# Patient Record
Sex: Female | Born: 1935 | Race: White | Hispanic: No | Smoking: Never smoker
Health system: Southern US, Community
[De-identification: ages and names within clinical notes are randomized; demographics above are authoritative.]

## PROBLEM LIST (undated history)

## (undated) DIAGNOSIS — I10 Essential (primary) hypertension: Secondary | ICD-10-CM

## (undated) DIAGNOSIS — K589 Irritable bowel syndrome without diarrhea: Secondary | ICD-10-CM

## (undated) DIAGNOSIS — M1990 Unspecified osteoarthritis, unspecified site: Secondary | ICD-10-CM

## (undated) HISTORY — PX: APPENDECTOMY: SHX54

## (undated) HISTORY — PX: ABDOMINAL HYSTERECTOMY: SHX81

## (undated) HISTORY — DX: Essential (primary) hypertension: I10

## (undated) HISTORY — PX: THYROIDECTOMY: SHX17

## (undated) HISTORY — DX: Irritable bowel syndrome, unspecified: K58.9

## (undated) HISTORY — PX: BREAST BIOPSY: SHX20

---

## 1999-07-29 ENCOUNTER — Other Ambulatory Visit: Admission: RE | Admit: 1999-07-29 | Discharge: 1999-07-29 | Payer: Self-pay | Admitting: Family Medicine

## 2006-10-17 HISTORY — PX: COLONOSCOPY: SHX174

## 2011-12-12 DIAGNOSIS — Z1231 Encounter for screening mammogram for malignant neoplasm of breast: Secondary | ICD-10-CM | POA: Diagnosis not present

## 2012-05-21 DIAGNOSIS — L570 Actinic keratosis: Secondary | ICD-10-CM | POA: Diagnosis not present

## 2012-05-21 DIAGNOSIS — D485 Neoplasm of uncertain behavior of skin: Secondary | ICD-10-CM | POA: Diagnosis not present

## 2012-05-21 DIAGNOSIS — Z85828 Personal history of other malignant neoplasm of skin: Secondary | ICD-10-CM | POA: Diagnosis not present

## 2012-05-21 DIAGNOSIS — D2330 Other benign neoplasm of skin of unspecified part of face: Secondary | ICD-10-CM | POA: Diagnosis not present

## 2012-06-20 DIAGNOSIS — Z124 Encounter for screening for malignant neoplasm of cervix: Secondary | ICD-10-CM | POA: Diagnosis not present

## 2012-06-20 DIAGNOSIS — E785 Hyperlipidemia, unspecified: Secondary | ICD-10-CM | POA: Diagnosis not present

## 2012-06-20 DIAGNOSIS — I10 Essential (primary) hypertension: Secondary | ICD-10-CM | POA: Diagnosis not present

## 2012-06-20 DIAGNOSIS — E559 Vitamin D deficiency, unspecified: Secondary | ICD-10-CM | POA: Diagnosis not present

## 2012-06-20 DIAGNOSIS — Z0000 Encounter for general adult medical examination without abnormal findings: Secondary | ICD-10-CM | POA: Diagnosis not present

## 2012-06-29 DIAGNOSIS — R7989 Other specified abnormal findings of blood chemistry: Secondary | ICD-10-CM | POA: Diagnosis not present

## 2012-06-29 DIAGNOSIS — R945 Abnormal results of liver function studies: Secondary | ICD-10-CM | POA: Diagnosis not present

## 2012-06-29 DIAGNOSIS — R109 Unspecified abdominal pain: Secondary | ICD-10-CM | POA: Diagnosis not present

## 2012-06-29 DIAGNOSIS — R634 Abnormal weight loss: Secondary | ICD-10-CM | POA: Diagnosis not present

## 2012-06-29 DIAGNOSIS — R630 Anorexia: Secondary | ICD-10-CM | POA: Diagnosis not present

## 2012-07-04 DIAGNOSIS — R634 Abnormal weight loss: Secondary | ICD-10-CM | POA: Diagnosis not present

## 2012-07-04 DIAGNOSIS — D510 Vitamin B12 deficiency anemia due to intrinsic factor deficiency: Secondary | ICD-10-CM | POA: Diagnosis not present

## 2012-07-04 DIAGNOSIS — R5383 Other fatigue: Secondary | ICD-10-CM | POA: Diagnosis not present

## 2012-07-04 DIAGNOSIS — R109 Unspecified abdominal pain: Secondary | ICD-10-CM | POA: Diagnosis not present

## 2012-07-10 DIAGNOSIS — Z23 Encounter for immunization: Secondary | ICD-10-CM | POA: Diagnosis not present

## 2012-07-16 DIAGNOSIS — H2589 Other age-related cataract: Secondary | ICD-10-CM | POA: Diagnosis not present

## 2012-07-16 DIAGNOSIS — H52229 Regular astigmatism, unspecified eye: Secondary | ICD-10-CM | POA: Diagnosis not present

## 2012-07-16 DIAGNOSIS — H524 Presbyopia: Secondary | ICD-10-CM | POA: Diagnosis not present

## 2012-07-16 DIAGNOSIS — H5200 Hypermetropia, unspecified eye: Secondary | ICD-10-CM | POA: Diagnosis not present

## 2012-07-17 DIAGNOSIS — M48061 Spinal stenosis, lumbar region without neurogenic claudication: Secondary | ICD-10-CM | POA: Diagnosis not present

## 2012-07-17 DIAGNOSIS — M5126 Other intervertebral disc displacement, lumbar region: Secondary | ICD-10-CM | POA: Diagnosis not present

## 2012-07-17 DIAGNOSIS — M4310 Spondylolisthesis, site unspecified: Secondary | ICD-10-CM | POA: Diagnosis not present

## 2012-07-17 DIAGNOSIS — M5137 Other intervertebral disc degeneration, lumbosacral region: Secondary | ICD-10-CM | POA: Diagnosis not present

## 2012-07-25 ENCOUNTER — Ambulatory Visit: Payer: Medicare Other | Attending: Family Medicine | Admitting: Physical Therapy

## 2012-07-25 DIAGNOSIS — M545 Low back pain, unspecified: Secondary | ICD-10-CM | POA: Insufficient documentation

## 2012-07-25 DIAGNOSIS — R5381 Other malaise: Secondary | ICD-10-CM | POA: Insufficient documentation

## 2012-07-25 DIAGNOSIS — IMO0001 Reserved for inherently not codable concepts without codable children: Secondary | ICD-10-CM | POA: Insufficient documentation

## 2012-07-27 ENCOUNTER — Ambulatory Visit: Payer: Medicare Other | Admitting: *Deleted

## 2012-07-27 DIAGNOSIS — IMO0001 Reserved for inherently not codable concepts without codable children: Secondary | ICD-10-CM | POA: Diagnosis not present

## 2012-07-27 DIAGNOSIS — M545 Low back pain: Secondary | ICD-10-CM | POA: Diagnosis not present

## 2012-07-27 DIAGNOSIS — R5381 Other malaise: Secondary | ICD-10-CM | POA: Diagnosis not present

## 2012-07-31 ENCOUNTER — Ambulatory Visit: Payer: Medicare Other | Admitting: Physical Therapy

## 2012-07-31 DIAGNOSIS — IMO0001 Reserved for inherently not codable concepts without codable children: Secondary | ICD-10-CM | POA: Diagnosis not present

## 2012-07-31 DIAGNOSIS — M545 Low back pain: Secondary | ICD-10-CM | POA: Diagnosis not present

## 2012-07-31 DIAGNOSIS — R5381 Other malaise: Secondary | ICD-10-CM | POA: Diagnosis not present

## 2012-08-02 ENCOUNTER — Ambulatory Visit: Payer: Medicare Other | Admitting: Physical Therapy

## 2012-08-02 DIAGNOSIS — R5381 Other malaise: Secondary | ICD-10-CM | POA: Diagnosis not present

## 2012-08-02 DIAGNOSIS — M545 Low back pain: Secondary | ICD-10-CM | POA: Diagnosis not present

## 2012-08-02 DIAGNOSIS — IMO0001 Reserved for inherently not codable concepts without codable children: Secondary | ICD-10-CM | POA: Diagnosis not present

## 2012-08-07 ENCOUNTER — Ambulatory Visit: Payer: Medicare Other | Admitting: Physical Therapy

## 2012-08-07 DIAGNOSIS — IMO0001 Reserved for inherently not codable concepts without codable children: Secondary | ICD-10-CM | POA: Diagnosis not present

## 2012-08-07 DIAGNOSIS — M545 Low back pain: Secondary | ICD-10-CM | POA: Diagnosis not present

## 2012-08-07 DIAGNOSIS — R5381 Other malaise: Secondary | ICD-10-CM | POA: Diagnosis not present

## 2012-08-09 ENCOUNTER — Ambulatory Visit: Payer: Medicare Other | Admitting: Physical Therapy

## 2012-08-09 DIAGNOSIS — R5381 Other malaise: Secondary | ICD-10-CM | POA: Diagnosis not present

## 2012-08-09 DIAGNOSIS — M545 Low back pain: Secondary | ICD-10-CM | POA: Diagnosis not present

## 2012-08-09 DIAGNOSIS — IMO0001 Reserved for inherently not codable concepts without codable children: Secondary | ICD-10-CM | POA: Diagnosis not present

## 2012-08-14 ENCOUNTER — Ambulatory Visit: Payer: Medicare Other | Admitting: Physical Therapy

## 2012-08-14 DIAGNOSIS — M545 Low back pain: Secondary | ICD-10-CM | POA: Diagnosis not present

## 2012-08-14 DIAGNOSIS — R5381 Other malaise: Secondary | ICD-10-CM | POA: Diagnosis not present

## 2012-08-14 DIAGNOSIS — IMO0001 Reserved for inherently not codable concepts without codable children: Secondary | ICD-10-CM | POA: Diagnosis not present

## 2012-08-16 ENCOUNTER — Ambulatory Visit: Payer: Medicare Other | Admitting: Physical Therapy

## 2012-08-16 DIAGNOSIS — R5381 Other malaise: Secondary | ICD-10-CM | POA: Diagnosis not present

## 2012-08-16 DIAGNOSIS — M545 Low back pain: Secondary | ICD-10-CM | POA: Diagnosis not present

## 2012-08-16 DIAGNOSIS — IMO0001 Reserved for inherently not codable concepts without codable children: Secondary | ICD-10-CM | POA: Diagnosis not present

## 2012-08-21 ENCOUNTER — Ambulatory Visit: Payer: Medicare Other | Attending: Family Medicine | Admitting: Physical Therapy

## 2012-08-21 DIAGNOSIS — IMO0001 Reserved for inherently not codable concepts without codable children: Secondary | ICD-10-CM | POA: Insufficient documentation

## 2012-08-21 DIAGNOSIS — M545 Low back pain, unspecified: Secondary | ICD-10-CM | POA: Diagnosis not present

## 2012-08-21 DIAGNOSIS — R5381 Other malaise: Secondary | ICD-10-CM | POA: Diagnosis not present

## 2012-08-23 ENCOUNTER — Ambulatory Visit: Payer: Medicare Other | Admitting: Physical Therapy

## 2012-08-28 ENCOUNTER — Ambulatory Visit: Payer: Medicare Other | Admitting: Physical Therapy

## 2012-08-30 ENCOUNTER — Ambulatory Visit: Payer: Medicare Other | Admitting: Physical Therapy

## 2012-10-18 LAB — HM DEXA SCAN: HM DEXA SCAN: NORMAL

## 2012-10-24 DIAGNOSIS — M949 Disorder of cartilage, unspecified: Secondary | ICD-10-CM | POA: Diagnosis not present

## 2012-10-24 DIAGNOSIS — E8941 Symptomatic postprocedural ovarian failure: Secondary | ICD-10-CM | POA: Diagnosis not present

## 2012-12-12 DIAGNOSIS — Z1231 Encounter for screening mammogram for malignant neoplasm of breast: Secondary | ICD-10-CM | POA: Diagnosis not present

## 2013-02-16 ENCOUNTER — Other Ambulatory Visit: Payer: Self-pay | Admitting: Nurse Practitioner

## 2013-02-18 NOTE — Telephone Encounter (Signed)
Patient last seen in office on 07-04-12 for chronic health check. Notified at last refill that NTBS. Please advise. Thank you

## 2013-02-21 ENCOUNTER — Other Ambulatory Visit: Payer: Self-pay | Admitting: Nurse Practitioner

## 2013-04-03 ENCOUNTER — Encounter: Payer: Self-pay | Admitting: Nurse Practitioner

## 2013-04-03 ENCOUNTER — Ambulatory Visit (INDEPENDENT_AMBULATORY_CARE_PROVIDER_SITE_OTHER): Payer: Medicare Other | Admitting: Nurse Practitioner

## 2013-04-03 VITALS — BP 132/76 | HR 82 | Temp 98.2°F | Ht 61.0 in | Wt 108.0 lb

## 2013-04-03 DIAGNOSIS — I10 Essential (primary) hypertension: Secondary | ICD-10-CM | POA: Diagnosis not present

## 2013-04-03 DIAGNOSIS — R3915 Urgency of urination: Secondary | ICD-10-CM | POA: Diagnosis not present

## 2013-04-03 LAB — COMPLETE METABOLIC PANEL WITH GFR
AST: 24 U/L (ref 0–37)
Albumin: 4.6 g/dL (ref 3.5–5.2)
Alkaline Phosphatase: 64 U/L (ref 39–117)
BUN: 16 mg/dL (ref 6–23)
Potassium: 4.2 mEq/L (ref 3.5–5.3)
Sodium: 139 mEq/L (ref 135–145)
Total Protein: 7.1 g/dL (ref 6.0–8.3)

## 2013-04-03 MED ORDER — SOLIFENACIN SUCCINATE 10 MG PO TABS: 10.0000 mg | ORAL_TABLET | Freq: Every day | ORAL | Status: DC

## 2013-04-03 MED ORDER — AMLODIPINE BESYLATE 5 MG PO TABS: 5.0000 mg | ORAL_TABLET | Freq: Every day | ORAL | Status: DC

## 2013-04-03 NOTE — Progress Notes (Signed)
  Subjective:    Patient ID: SATOMI BUDA, female    DOB: 1936-02-19, 77 y.o.   MRN: [id number]  Hypertension This is a chronic problem. The current episode started more than 1 year ago. The problem is unchanged. The problem is controlled. Pertinent negatives include no anxiety, chest pain, headaches, malaise/fatigue, palpitations, peripheral edema or shortness of breath. There are no associated agents to hypertension. Risk factors for coronary artery disease include post-menopausal state. Past treatments include calcium channel blockers. The current treatment provides significant improvement.   Urinary urgency and frequency- started abiut 3 months ago and has gotten worse   Review of Systems  Constitutional: Negative for malaise/fatigue.  Respiratory: Negative for shortness of breath.   Cardiovascular: Negative for chest pain and palpitations.  Neurological: Negative for headaches.  All other systems reviewed and are negative.       Objective:   Physical Exam  Constitutional: She is oriented to person, place, and time. She appears well-developed and well-nourished.  HENT:  Nose: Nose normal.  Mouth/Throat: Oropharynx is clear and moist.  Eyes: EOM are normal.  Neck: Trachea normal, normal range of motion and full passive range of motion without pain. Neck supple. No JVD present. Carotid bruit is not present. No thyromegaly present.  Cardiovascular: Normal rate, regular rhythm, normal heart sounds and intact distal pulses.  Exam reveals no gallop and no friction rub.   No murmur heard. Pulmonary/Chest: Effort normal and breath sounds normal.  Abdominal: Soft. Bowel sounds are normal. She exhibits no distension and no mass. There is no tenderness.  Musculoskeletal: Normal range of motion.  Lymphadenopathy:    She has no cervical adenopathy.  Neurological: She is alert and oriented to person, place, and time. She has normal reflexes.  Skin: Skin is warm and dry.  Psychiatric: She  has a normal mood and affect. Her behavior is normal. Judgment and thought content normal.    BP 132/76  Pulse 82  Temp(Src) 98.2 F (36.8 C) (Oral)  Ht 5\' 1"  (1.549 m)  Wt 108 lb (48.988 kg)  BMI 20.42 kg/m2       Assessment & Plan:  1. Hypertension Low NA+ diet Conitnue exercsie - amLODipine (NORVASC) 5 MG tablet; Take 1 tablet (5 mg total) by mouth daily.  Dispense: 30 tablet; Refill: 5 - COMPLETE METABOLIC PANEL WITH GFR - NMR Lipoprofile with Lipids  2. Urinary urgency  - solifenacin (VESICARE) 10 MG tablet; Take 1 tablet (10 mg total) by mouth daily.  Dispense: 30 tablet; Refill: 5   Mary-Margaret Daphine Deutscher, FNP

## 2013-04-03 NOTE — Patient Instructions (Signed)

## 2013-04-04 LAB — NMR LIPOPROFILE WITH LIPIDS
Cholesterol, Total: 202 mg/dL — ABNORMAL HIGH (ref <200)
HDL-C: 65 mg/dL (ref >=40)
LDL Particle Number: 1720 nmol/L — ABNORMAL HIGH (ref <1000)
LP-IR Score: <25 (ref <=45)
Large VLDL-P: <0.8 nmol/L (ref <=2.7)
Triglycerides: 80 mg/dL (ref <150)
VLDL Size: 38.9 nm (ref <=46.6)

## 2013-04-10 ENCOUNTER — Telehealth: Payer: Self-pay | Admitting: *Deleted

## 2013-04-10 DIAGNOSIS — R350 Frequency of micturition: Secondary | ICD-10-CM

## 2013-04-10 MED ORDER — TOLTERODINE TARTRATE ER 4 MG PO CP24: 4.0000 mg | ORAL_CAPSULE | Freq: Every day | ORAL | Status: DC

## 2013-04-10 NOTE — Telephone Encounter (Signed)
Ins company  Requires  Pt to try either Detrol LA, oxybutryn or oxybutaynin  ER first. Can you take care of this for me?   Thank you!

## 2013-04-10 NOTE — Telephone Encounter (Signed)
detrol LA rx sent to pharmacy

## 2013-04-11 NOTE — Telephone Encounter (Signed)
Pt aware.

## 2013-05-20 DIAGNOSIS — L723 Sebaceous cyst: Secondary | ICD-10-CM | POA: Diagnosis not present

## 2013-05-20 DIAGNOSIS — L821 Other seborrheic keratosis: Secondary | ICD-10-CM | POA: Diagnosis not present

## 2013-05-20 DIAGNOSIS — Z85828 Personal history of other malignant neoplasm of skin: Secondary | ICD-10-CM | POA: Diagnosis not present

## 2013-05-20 DIAGNOSIS — D1801 Hemangioma of skin and subcutaneous tissue: Secondary | ICD-10-CM | POA: Diagnosis not present

## 2013-06-11 ENCOUNTER — Telehealth: Payer: Self-pay | Admitting: Nurse Practitioner

## 2013-06-11 ENCOUNTER — Ambulatory Visit (INDEPENDENT_AMBULATORY_CARE_PROVIDER_SITE_OTHER): Payer: Medicare Other | Admitting: Family Medicine

## 2013-06-11 ENCOUNTER — Encounter: Payer: Self-pay | Admitting: Family Medicine

## 2013-06-11 ENCOUNTER — Ambulatory Visit (HOSPITAL_COMMUNITY)
Admission: RE | Admit: 2013-06-11 | Discharge: 2013-06-11 | Disposition: A | Discharge status: Home / Self Care | Payer: Medicare Other | Source: Ambulatory Visit | Attending: Family Medicine | Admitting: Family Medicine

## 2013-06-11 ENCOUNTER — Other Ambulatory Visit: Payer: Self-pay | Admitting: Family Medicine

## 2013-06-11 VITALS — BP 137/79 | HR 68 | Temp 97.9°F | Ht 61.0 in | Wt 110.0 lb

## 2013-06-11 DIAGNOSIS — K5730 Diverticulosis of large intestine without perforation or abscess without bleeding: Secondary | ICD-10-CM | POA: Diagnosis not present

## 2013-06-11 DIAGNOSIS — K8020 Calculus of gallbladder without cholecystitis without obstruction: Secondary | ICD-10-CM | POA: Insufficient documentation

## 2013-06-11 DIAGNOSIS — R1032 Left lower quadrant pain: Secondary | ICD-10-CM | POA: Diagnosis not present

## 2013-06-11 DIAGNOSIS — R109 Unspecified abdominal pain: Secondary | ICD-10-CM | POA: Insufficient documentation

## 2013-06-11 LAB — POCT CBC
HCT, POC: 38.2 % (ref 37.7–47.9)
Lymph, poc: 4.5 — AB (ref 0.6–3.4)
MCH, POC: 29.7 pg (ref 27–31.2)
MCHC: 34.7 g/dL (ref 31.8–35.4)
MCV: 85.6 fL (ref 80–97)
POC Granulocyte: 4.5 (ref 2–6.9)
POC LYMPH PERCENT: 22 %L (ref 10–50)
RDW, POC: 13.1 %
WBC: 6.2 10*3/uL (ref 4.6–10.2)

## 2013-06-11 LAB — POCT URINALYSIS DIPSTICK
Glucose, UA: NEGATIVE
Ketones, UA: NEGATIVE
Spec Grav, UA: 1.01
Urobilinogen, UA: NEGATIVE

## 2013-06-11 LAB — POCT UA - MICROSCOPIC ONLY
Crystals, Ur, HPF, POC: NEGATIVE
Mucus, UA: NEGATIVE

## 2013-06-11 MED ORDER — CIPROFLOXACIN HCL 500 MG PO TABS: 500.0000 mg | ORAL_TABLET | Freq: Two times a day (BID) | ORAL | Status: DC

## 2013-06-11 MED ORDER — METRONIDAZOLE 500 MG PO TABS: 500.0000 mg | ORAL_TABLET | Freq: Three times a day (TID) | ORAL | Status: DC

## 2013-06-11 MED ORDER — IOHEXOL 300 MG/ML  SOLN
100.0000 mL | Freq: Once | INTRAMUSCULAR | Status: AC | PRN
  Administered 2013-06-11: 100 mL via INTRAVENOUS

## 2013-06-11 NOTE — Progress Notes (Signed)
  Subjective:    Patient ID: Sandra Hamilton, female    DOB: 1936-05-30, 77 y.o.   MRN: [id number]  HPI Patient presents today with chief complaint of abdominal pain. Patient states she's had lower, pain over the past week in association with alternating episodes of diarrhea constipation. Patient denies any recent changes in her diet. No fevers or chills. Patient denies any nausea although she has had decreased appetite. Patient states she's had a 3 to four-day episode of persistent nonbloody nonbilious diarrhea followed by 2-3 days of constipation. Patient for she has a prior history of IBS that was diagnosed in the remote past. However has had otherwise normal bowel movements prior to current symptoms. Patient denies any recent dietary changes. No recent trauma.   Review of Systems  All other systems reviewed and are negative.       Objective:   Physical Exam  Constitutional: She appears well-developed and well-nourished.  HENT:  Head: Normocephalic and atraumatic.  Eyes: Conjunctivae are normal. Pupils are equal, round, and reactive to light.  Neck: Normal range of motion.  Cardiovascular: Normal rate and regular rhythm.   Pulmonary/Chest: Effort normal.  Abdominal: Soft. Bowel sounds are normal.  Positive reproducible left lower quadrant tenderness to palpation (greater than 8/10 pain)  Musculoskeletal: Normal range of motion.  Neurological: She is alert.  Skin: Skin is warm.      Assessment & Plan:  Abdominal pain - Plan: POCT CBC, CMP14+EGFR, CT Abdomen Pelvis W Contrast, CANCELED: CT Abdomen Pelvis W Contrast  LLQ pain - Plan: POCT urinalysis dipstick  Differential diagnoses includes IBS flare, colitis, diverticulitis, UTI. Given markedly reproducible tenderness palpation in left lower quadrant, will obtain a CT of the abdomen and pelvis with IV and oral contrast to better assess anatomy. Fitzgibbons blood cell count on 6 today. No fever. We'll hold off on  antibiotics. Minimal changes on UA. No urinary complaints. Will culture.  Discussed general in GI red flags at length with patient. Patient feels comfortable waiting for CT scan to determine treatment plan. We'll followup pending CT scan results. Patient expressed understanding of plan.

## 2013-06-11 NOTE — Telephone Encounter (Signed)
APPT MADE

## 2013-06-11 NOTE — Addendum Note (Signed)
Addended by: Orma Render F on: 06/11/2013 02:24 PM   Modules accepted: Orders

## 2013-06-12 LAB — CMP14+EGFR
ALT: 14 IU/L (ref 0–32)
Albumin/Globulin Ratio: 2.2 (ref 1.1–2.5)
Alkaline Phosphatase: 74 IU/L (ref 39–117)
BUN/Creatinine Ratio: 18 (ref 11–26)
Chloride: 97 mmol/L (ref 97–108)
GFR calc Af Amer: 89 mL/min/{1.73_m2} (ref >59)
GFR calc non Af Amer: 77 mL/min/{1.73_m2} (ref >59)
Potassium: 4.3 mmol/L (ref 3.5–5.2)
Sodium: 140 mmol/L (ref 134–144)
Total Bilirubin: 0.3 mg/dL (ref 0.0–1.2)

## 2013-06-12 LAB — POCT I-STAT, CHEM 8
BUN: 11 mg/dL (ref 6–23)
Chloride: 100 mEq/L (ref 96–112)
Creatinine, Ser: 0.8 mg/dL (ref 0.50–1.10)
Glucose, Bld: 106 mg/dL — ABNORMAL HIGH (ref 70–99)
HCT: 45 % (ref 36.0–46.0)
Potassium: 3.5 mEq/L (ref 3.5–5.1)

## 2013-06-13 LAB — URINE CULTURE

## 2013-06-18 ENCOUNTER — Telehealth: Payer: Self-pay | Admitting: Family Medicine

## 2013-06-20 NOTE — Telephone Encounter (Signed)
Patient aware by gina hudy in lab results

## 2013-07-03 DIAGNOSIS — M1710 Unilateral primary osteoarthritis, unspecified knee: Secondary | ICD-10-CM | POA: Diagnosis not present

## 2013-07-03 DIAGNOSIS — M25569 Pain in unspecified knee: Secondary | ICD-10-CM | POA: Diagnosis not present

## 2013-07-16 ENCOUNTER — Ambulatory Visit (INDEPENDENT_AMBULATORY_CARE_PROVIDER_SITE_OTHER): Payer: Medicare Other

## 2013-07-16 DIAGNOSIS — Z23 Encounter for immunization: Secondary | ICD-10-CM

## 2013-07-19 DIAGNOSIS — H5200 Hypermetropia, unspecified eye: Secondary | ICD-10-CM | POA: Diagnosis not present

## 2013-07-19 DIAGNOSIS — H2589 Other age-related cataract: Secondary | ICD-10-CM | POA: Diagnosis not present

## 2013-07-19 DIAGNOSIS — H52229 Regular astigmatism, unspecified eye: Secondary | ICD-10-CM | POA: Diagnosis not present

## 2013-07-19 DIAGNOSIS — H524 Presbyopia: Secondary | ICD-10-CM | POA: Diagnosis not present

## 2013-10-04 ENCOUNTER — Ambulatory Visit (INDEPENDENT_AMBULATORY_CARE_PROVIDER_SITE_OTHER): Payer: Medicare Other | Admitting: Physician Assistant

## 2013-10-04 VITALS — BP 136/74 | HR 87 | Temp 98.3°F | Ht 61.0 in | Wt 113.0 lb

## 2013-10-04 DIAGNOSIS — J069 Acute upper respiratory infection, unspecified: Secondary | ICD-10-CM

## 2013-10-04 MED ORDER — AMOXICILLIN 875 MG PO TABS: 875.0000 mg | ORAL_TABLET | Freq: Two times a day (BID) | ORAL | Status: DC

## 2013-10-04 MED ORDER — FLUTICASONE PROPIONATE 50 MCG/ACT NA SUSP: 2.0000 | Freq: Every day | NASAL | Status: DC

## 2013-10-04 NOTE — Patient Instructions (Signed)
Drink plenty of fluids. Take OTC Benadryl as directed. If s/s have not improved after 7-10 days or begin having a fever, may begin antibiotic.

## 2013-10-04 NOTE — Progress Notes (Signed)
   Subjective:    Patient ID: Sandra Hamilton, female    DOB: May 03, 1936, 77 y.o.   MRN: [id number]  HPI 77 y/o female presents with c/o PND, cough worse at night. Denies fever or other symptoms. Has tried Robitussin with no relief. No aggravating or relieving factors. She is leaving for [place] tomorrow for 10 days.     Review of Systems  Constitutional: Negative.   HENT: Positive for congestion, postnasal drip, rhinorrhea, sinus pressure and sore throat. Negative for ear pain, sneezing, tinnitus, trouble swallowing and voice change.   Respiratory: Positive for cough. Negative for chest tightness, shortness of breath and wheezing.   Gastrointestinal: Negative.   Musculoskeletal: Negative.   Neurological: Negative.        Objective:   Physical Exam  Vitals reviewed. Constitutional: She is oriented to person, place, and time. She appears well-developed and well-nourished. No distress.  HENT:  Right Ear: External ear normal.  Left Ear: External ear normal.  Eyes: Right eye exhibits no discharge. Left eye exhibits no discharge.  Cardiovascular: Normal rate, regular rhythm and normal heart sounds.  Exam reveals no gallop and no friction rub.   No murmur heard. Pulmonary/Chest: Effort normal and breath sounds normal. No respiratory distress. She has no wheezes.  Neurological: She is alert and oriented to person, place, and time.  Skin: She is not diaphoretic.  Psychiatric: She has a normal mood and affect. Her behavior is normal. Judgment and thought content normal.          Assessment & Plan:  1. Viral URI: Flonase q day. Plenty of fluids. OTC plain musinex for congestion relief. Since patient is going to [place] and will be out of town for 10 days, I gave her Amoxicillin, advised her to get it filled and begin taking if symptoms worsen or continue past 10 days.

## 2013-10-09 ENCOUNTER — Telehealth: Payer: Self-pay | Admitting: Physician Assistant

## 2013-10-14 ENCOUNTER — Other Ambulatory Visit: Payer: Self-pay | Admitting: Nurse Practitioner

## 2013-10-20 ENCOUNTER — Encounter: Payer: Self-pay | Admitting: Physician Assistant

## 2013-11-12 ENCOUNTER — Encounter: Payer: Self-pay | Admitting: Nurse Practitioner

## 2013-11-12 ENCOUNTER — Ambulatory Visit (INDEPENDENT_AMBULATORY_CARE_PROVIDER_SITE_OTHER): Payer: Medicare Other | Admitting: Nurse Practitioner

## 2013-11-12 VITALS — BP 151/89 | HR 91 | Temp 97.0°F | Ht 61.0 in | Wt 111.0 lb

## 2013-11-12 DIAGNOSIS — IMO0002 Reserved for concepts with insufficient information to code with codable children: Secondary | ICD-10-CM | POA: Diagnosis not present

## 2013-11-12 DIAGNOSIS — S86899A Other injury of other muscle(s) and tendon(s) at lower leg level, unspecified leg, initial encounter: Secondary | ICD-10-CM

## 2013-11-12 MED ORDER — DICLOFENAC SODIUM 1 % TD GEL: 4.0000 g | Freq: Four times a day (QID) | TRANSDERMAL | Status: DC

## 2013-11-12 MED ORDER — AMLODIPINE BESYLATE 5 MG PO TABS: 5.0000 mg | ORAL_TABLET | Freq: Every day | ORAL | Status: DC

## 2013-11-12 NOTE — Progress Notes (Signed)
   Subjective:    Patient ID: Sandra Hamilton, female    DOB: Jun 11, 1936, 78 y.o.   MRN: [id number]  HPI Patient in c/o bil shin pain- started 2-3 weeks ago- Patient denies any injury or extended periods of walking. Has taken advil which helps for just a short period of time.    Review of Systems  Constitutional: Negative.   HENT: Negative.   Respiratory: Negative.   Cardiovascular: Negative.   Genitourinary: Negative.   All other systems reviewed and are negative.       Objective:   Physical Exam  Constitutional: She is oriented to person, place, and time. She appears well-developed and well-nourished.  Cardiovascular: Normal rate, regular rhythm and normal heart sounds.   Pulmonary/Chest: Effort normal and breath sounds normal.  Musculoskeletal:  np pain on palpation no erythema - no lesions  Neurological: She is alert and oriented to person, place, and time.  Skin: Skin is warm and dry.     BP 151/89  Pulse 91  Temp(Src) 97 F (36.1 C) (Oral)  Ht 5\' 1"  (1.549 m)  Wt 111 lb (50.349 kg)  BMI 20.98 kg/m2      Assessment & Plan:   1. Shin splints    Meds ordered this encounter  Medications  . diclofenac sodium (VOLTAREN) 1 % GEL    Sig: Apply 4 g topically 4 (four) times daily.    Dispense:  5 Tube    Refill:  2    Order Specific Question:  Supervising Provider    Answer:  Chipper Herb [[id number]]   shi splint exercises discussed Follow up prn  Mary-Margaret Hassell Done, FNP

## 2013-11-12 NOTE — Patient Instructions (Signed)
Shin Splints Shin splints is a painful condition that is felt on the shinbone or in the muscles on either side of the bone (front of your lower leg). Shin splints happen when physical activities, such as sports or other demanding exercise, leads to inflammation of the muscles, tendons, and the thin layer that covers the shinbone.  CAUSES   Overuse of muscles.  Repetitive activities.  Flat feet or rigid arches. Activities that could contribute to shin splints include:  A sudden increase in exercise time.  Starting a new, demanding activity.  Running up hills or long distances.  Playing sports with sudden starts and stops.  A poor warm up.  Old or worn-out shoes. SYMPTOMS   Pain on the front of the leg.  Pain while exercising or at rest. DIAGNOSIS  Your caregiver will diagnose shin splints from a history of your symptoms and a physical exam. You may be observed as you walk or run. X-ray exams or further testing may be needed to rule out other problems, such as a stress fracture, which also causes lower leg pain. TREATMENT  Your caregiver may decide on the treatment based on your age, history, health, and how bad the pain is. Most cases of shin splints can be managed by one or more of the following:  Resting.  Reducing the length and intensity of your exercise.  Stopping the activity that causes shin pain.  Taking medicines to control the inflammation.  Icing, massaging, stretching, and strengthening the affected area.  Getting shoes with rigid heels, shock absorption, and a good arch support. HOME CARE INSTRUCTIONS   Resume activity steadily or as directed by your caregiver.  Restart your exercise sessions with non-weight-bearing exercises, such as cycling or swimming.  Stop running if the pain returns.  Warm up properly before exercising.  Run on a level and fairly firm surface.  Gradually change the intensity of an exercise.  Limit increases in running  distance by no more than 5 to 10% weekly. This means if you are running 5 miles, you can only increase your run by 1/2 a mile at a time.  Change your athletic shoes every 6 months, or every 350 to 450 miles. SEEK MEDICAL CARE IF:   Symptoms continue or worsen even after treatment.  The location, intensity, or type of pain changes over time. SEEK IMMEDIATE MEDICAL CARE IF:   You have severe pain.  You have trouble walking. MAKE SURE YOU:  Understand these instructions.  Will watch your condition.  Will get help right away if you are not doing well or get worse. Document Released: 09/30/2000 Document Revised: 12/26/2011 Document Reviewed: 03/20/2011 [facility] Patient Information 2014 [place], [place].

## 2013-12-18 DIAGNOSIS — Z1231 Encounter for screening mammogram for malignant neoplasm of breast: Secondary | ICD-10-CM | POA: Diagnosis not present

## 2014-02-20 ENCOUNTER — Ambulatory Visit (INDEPENDENT_AMBULATORY_CARE_PROVIDER_SITE_OTHER): Payer: Medicare Other | Admitting: Family

## 2014-02-20 ENCOUNTER — Encounter: Payer: Self-pay | Admitting: Family

## 2014-02-20 VITALS — BP 145/74 | HR 88 | Temp 99.4°F | Ht 61.0 in | Wt 115.6 lb

## 2014-02-20 DIAGNOSIS — H6123 Impacted cerumen, bilateral: Secondary | ICD-10-CM

## 2014-02-20 DIAGNOSIS — H6120 Impacted cerumen, unspecified ear: Secondary | ICD-10-CM

## 2014-02-20 NOTE — Patient Instructions (Signed)
Cerumen Impaction A cerumen impaction is when the wax in your ear forms a plug. This plug usually causes reduced hearing. Sometimes it also causes an earache or dizziness. Removing a cerumen impaction can be difficult and painful. The wax sticks to the ear canal. The canal is sensitive and bleeds easily. If you try to remove a heavy wax buildup with a cotton tipped swab, you may push it in further. Irrigation with water, suction, and small ear curettes may be used to clear out the wax. If the impaction is fixed to the skin in the ear canal, ear drops may be needed for a few days to loosen the wax. People who build up a lot of wax frequently can use ear wax removal products available in your local drugstore. SEEK MEDICAL CARE IF:  You develop an earache, increased hearing loss, or marked dizziness. Document Released: 11/10/2004 Document Revised: 12/26/2011 Document Reviewed: 12/31/2009 [employer] Patient Information 2014 [employer].  

## 2014-02-20 NOTE — Progress Notes (Signed)
   Subjective:    Patient ID: Sandra Hamilton, female    DOB: 03-04-1936, 78 y.o.   MRN: [id number]  Ear Fullness  There is pain in both ears. This is a new problem. The current episode started in the past 7 days. The problem occurs constantly. The problem has been gradually worsening. There has been no fever. The patient is experiencing no pain. Associated symptoms include hearing loss. Pertinent negatives include no coughing, diarrhea, headaches, rhinorrhea, sore throat or vomiting. She has tried nothing for the symptoms.      Review of Systems  HENT: Positive for hearing loss. Negative for rhinorrhea and sore throat.   Respiratory: Negative for cough.   Gastrointestinal: Negative for vomiting and diarrhea.  Neurological: Negative for headaches.  All other systems reviewed and are negative.      Objective:   Physical Exam  Vitals reviewed. Constitutional: She is oriented to person, place, and time. She appears well-developed and well-nourished. No distress.  HENT:  Head: Normocephalic and atraumatic.  Mouth/Throat: Oropharynx is clear and moist.  Bilateral ears cerumen impaction. TM unable to visualize. .   Eyes: Pupils are equal, round, and reactive to light.  Neck: Normal range of motion. Neck supple. No thyromegaly present.  Cardiovascular: Normal rate, regular rhythm, normal heart sounds and intact distal pulses.   No murmur heard. Pulmonary/Chest: Effort normal and breath sounds normal. No respiratory distress. She has no wheezes.  Musculoskeletal: Normal range of motion. She exhibits no edema and no tenderness.  Neurological: She is alert and oriented to person, place, and time.  Skin: Skin is warm and dry.  Psychiatric: She has a normal mood and affect. Her behavior is normal. Judgment and thought content normal.     BP 145/74  Pulse 88  Temp(Src) 99.4 F (37.4 C) (Oral)  Ht 5\' 1"  (1.549 m)  Wt 115 lb 9.6 oz (52.436 kg)  BMI 21.85 kg/m2      Assessment & Plan:    1. Impacted cerumen of both ears -Keep ears clean and dry -Do not pull or place anything in ears  Evelina Dun, FNP

## 2014-04-18 ENCOUNTER — Other Ambulatory Visit: Payer: Self-pay | Admitting: Nurse Practitioner

## 2014-04-21 NOTE — Telephone Encounter (Signed)
No labs since 05/2013, has been seen for acute stuff

## 2014-04-21 NOTE — Telephone Encounter (Signed)
No more refills without being seen 

## 2014-05-05 ENCOUNTER — Ambulatory Visit (INDEPENDENT_AMBULATORY_CARE_PROVIDER_SITE_OTHER): Payer: Medicare Other | Admitting: Nurse Practitioner

## 2014-05-05 ENCOUNTER — Encounter: Payer: Self-pay | Admitting: Nurse Practitioner

## 2014-05-05 VITALS — BP 140/78 | HR 74 | Temp 97.8°F | Ht 61.0 in | Wt 111.0 lb

## 2014-05-05 DIAGNOSIS — Z23 Encounter for immunization: Secondary | ICD-10-CM | POA: Diagnosis not present

## 2014-05-05 DIAGNOSIS — I10 Essential (primary) hypertension: Secondary | ICD-10-CM

## 2014-05-05 MED ORDER — AMLODIPINE BESYLATE 5 MG PO TABS: ORAL_TABLET | ORAL | Status: DC

## 2014-05-05 NOTE — Progress Notes (Signed)
  Subjective:    Patient ID: Sandra Hamilton, female    DOB: 1936-10-08, 78 y.o.   MRN: [id number]  Patient here today for follow up of chronic medical problems.  Hypertension This is a chronic problem. The current episode started more than 1 year ago. The problem is unchanged. The problem is controlled. Pertinent negatives include no anxiety, chest pain, headaches, malaise/fatigue, palpitations, peripheral edema or shortness of breath. There are no associated agents to hypertension. Risk factors for coronary artery disease include post-menopausal state. Past treatments include calcium channel blockers. The current treatment provides significant improvement.      Review of Systems  Constitutional: Negative for malaise/fatigue.  Respiratory: Negative for shortness of breath.   Cardiovascular: Negative for chest pain and palpitations.  Neurological: Negative for headaches.  All other systems reviewed and are negative.      Objective:   Physical Exam  Constitutional: She is oriented to person, place, and time. She appears well-developed and well-nourished.  HENT:  Nose: Nose normal.  Mouth/Throat: Oropharynx is clear and moist.  Eyes: EOM are normal.  Neck: Trachea normal, normal range of motion and full passive range of motion without pain. Neck supple. No JVD present. Carotid bruit is not present. No thyromegaly present.  Cardiovascular: Normal rate, regular rhythm, normal heart sounds and intact distal pulses.  Exam reveals no gallop and no friction rub.   No murmur heard. Pulmonary/Chest: Effort normal and breath sounds normal.  Abdominal: Soft. Bowel sounds are normal. She exhibits no distension and no mass. There is no tenderness.  Musculoskeletal: Normal range of motion.  Lymphadenopathy:    She has no cervical adenopathy.  Neurological: She is alert and oriented to person, place, and time. She has normal reflexes.  Skin: Skin is warm and dry.  Psychiatric: She has a normal mood  and affect. Her behavior is normal. Judgment and thought content normal.    BP 140/78  Pulse 74  Temp(Src) 97.8 F (36.6 C) (Oral)  Ht $R'5\' 1"'wL$  (1.549 m)  Wt 111 lb (50.349 kg)  BMI 20.98 kg/m2       Assessment & Plan:   1. Essential hypertension    Orders Placed This Encounter  Procedures  . HM DEXA SCAN    This external order was created through the Results Console.  . CMP14+EGFR  . NMR, lipoprofile   Meds ordered this encounter  Medications  . Misc Natural Products (OSTEO BI-FLEX JOINT SHIELD PO)    Sig: Apply 1 application topically as needed.  Marland Kitchen amLODipine (NORVASC) 5 MG tablet    Sig: TAKE 1 TABLET (5 MG TOTAL) BY MOUTH DAILY.    Dispense:  30 tablet    Refill:  5    Order Specific Question:  Supervising Provider    Answer:  Chipper Herb [[id number]]   prevnar 13 given today Labs pending Health maintenance reviewed Diet and exercise encouraged Continue all meds Follow up  In 6 minths   [place], FNP

## 2014-05-05 NOTE — Patient Instructions (Signed)
Hypertension Hypertension, commonly called high blood pressure, is when the force of blood pumping through your arteries is too strong. Your arteries are the blood vessels that carry blood from your heart throughout your body. A blood pressure reading consists of a higher number over a lower number, such as 110/72. The higher number (systolic) is the pressure inside your arteries when your heart pumps. The lower number (diastolic) is the pressure inside your arteries when your heart relaxes. Ideally you want your blood pressure below 120/80. Hypertension forces your heart to work harder to pump blood. Your arteries may become narrow or stiff. Having hypertension puts you at risk for heart disease, stroke, and other problems.  RISK FACTORS Some risk factors for high blood pressure are controllable. Others are not.  Risk factors you cannot control include:   Race. You may be at higher risk if you are African American.  Age. Risk increases with age.  Gender. Men are at higher risk than women before age 45 years. After age 65, women are at higher risk than men. Risk factors you can control include:  Not getting enough exercise or physical activity.  Being overweight.  Getting too much fat, sugar, calories, or salt in your diet.  Drinking too much alcohol. SIGNS AND SYMPTOMS Hypertension does not usually cause signs or symptoms. Extremely high blood pressure (hypertensive crisis) may cause headache, anxiety, shortness of breath, and nosebleed. DIAGNOSIS  To check if you have hypertension, your health care provider will measure your blood pressure while you are seated, with your arm held at the level of your heart. It should be measured at least twice using the same arm. Certain conditions can cause a difference in blood pressure between your right and left arms. A blood pressure reading that is higher than normal on one occasion does not mean that you need treatment. If one blood pressure reading  is high, ask your health care provider about having it checked again. TREATMENT  Treating high blood pressure includes making lifestyle changes and possibly taking medication. Living a healthy lifestyle can help lower high blood pressure. You may need to change some of your habits. Lifestyle changes may include:  Following the DASH diet. This diet is high in fruits, vegetables, and whole grains. It is low in salt, red meat, and added sugars.  Getting at least 2 1/2 hours of brisk physical activity every week.  Losing weight if necessary.  Not smoking.  Limiting alcoholic beverages.  Learning ways to reduce stress. If lifestyle changes are not enough to get your blood pressure under control, your health care provider may prescribe medicine. You may need to take more than one. Work closely with your health care provider to understand the risks and benefits. HOME CARE INSTRUCTIONS  Have your blood pressure rechecked as directed by your health care provider.   Only take medicine as directed by your health care provider. Follow the directions carefully. Blood pressure medicines must be taken as prescribed. The medicine does not work as well when you skip doses. Skipping doses also puts you at risk for problems.   Do not smoke.   Monitor your blood pressure at home as directed by your health care provider. SEEK MEDICAL CARE IF:   You think you are having a reaction to medicines taken.  You have recurrent headaches or feel dizzy.  You have swelling in your ankles.  You have trouble with your vision. SEEK IMMEDIATE MEDICAL CARE IF:  You develop a severe headache or   confusion.  You have unusual weakness, numbness, or feel faint.  You have severe chest or abdominal pain.  You vomit repeatedly.  You have trouble breathing. MAKE SURE YOU:   Understand these instructions.  Will watch your condition.  Will get help right away if you are not doing well or get  worse. Document Released: 10/03/2005 Document Revised: 10/08/2013 Document Reviewed: 07/26/2013 [employer] Patient Information 2015 [employer]. This information is not intended to replace advice given to you by your health care provider. Make sure you discuss any questions you have with your health care provider.  

## 2014-05-10 LAB — CMP14+EGFR
A/G RATIO: 2.2 (ref 1.1–2.5)
ALT: 9 IU/L (ref 0–32)
AST: 18 IU/L (ref 0–40)
Albumin: 4.6 g/dL (ref 3.5–4.8)
Alkaline Phosphatase: 84 IU/L (ref 39–117)
BILIRUBIN TOTAL: 0.3 mg/dL (ref 0.0–1.2)
BUN/Creatinine Ratio: 19 (ref 11–26)
BUN: 15 mg/dL (ref 8–27)
CALCIUM: 9.8 mg/dL (ref 8.7–10.3)
CHLORIDE: 99 mmol/L (ref 97–108)
CO2: 25 mmol/L (ref 18–29)
Creatinine, Ser: 0.8 mg/dL (ref 0.57–1.00)
GFR calc Af Amer: 83 mL/min/{1.73_m2} (ref >59)
GFR, EST NON AFRICAN AMERICAN: 72 mL/min/{1.73_m2} (ref >59)
GLUCOSE: 91 mg/dL (ref 65–99)
Globulin, Total: 2.1 g/dL (ref 1.5–4.5)
POTASSIUM: 4.2 mmol/L (ref 3.5–5.2)
SODIUM: 142 mmol/L (ref 134–144)
TOTAL PROTEIN: 6.7 g/dL (ref 6.0–8.5)

## 2014-05-10 LAB — NMR, LIPOPROFILE
Cholesterol: 215 mg/dL — ABNORMAL HIGH (ref 100–199)
HDL CHOLESTEROL BY NMR: 73 mg/dL (ref >39)
HDL Particle Number: 37 umol/L (ref >=30.5)
LDL PARTICLE NUMBER: 1304 nmol/L — AB (ref <1000)
LDL SIZE: 20.7 nm (ref >20.5)
LDLC SERPL CALC-MCNC: 126 mg/dL — ABNORMAL HIGH (ref 0–99)
LP-IR Score: <25 (ref <=45)
SMALL LDL PARTICLE NUMBER: 408 nmol/L (ref <=527)
TRIGLYCERIDES BY NMR: 78 mg/dL (ref 0–149)

## 2014-05-12 ENCOUNTER — Other Ambulatory Visit: Payer: Self-pay | Admitting: Nurse Practitioner

## 2014-05-12 MED ORDER — ROSUVASTATIN CALCIUM 10 MG PO TABS: 10.0000 mg | ORAL_TABLET | Freq: Every day | ORAL | Status: DC

## 2014-05-14 ENCOUNTER — Telehealth: Payer: Self-pay | Admitting: *Deleted

## 2014-05-14 MED ORDER — PRAVASTATIN SODIUM 40 MG PO TABS: 40.0000 mg | ORAL_TABLET | Freq: Every day | ORAL | Status: DC

## 2014-05-14 NOTE — Telephone Encounter (Signed)
Had to change crestor to pravachol due to insurance

## 2014-05-14 NOTE — Telephone Encounter (Signed)
PATIENT AWARE

## 2014-05-14 NOTE — Telephone Encounter (Signed)
MM, [employer] ins denied crestor because pt has no documentation of trying pravastatin, I explained that she had taken lipitor and zocor with liver function problems and that we had tried her on samples of crestor without problems, however they were only interested in having documentation that she has tried the pravastatin and either it has failed or she has had some kind of reaction.  Unreal!

## 2014-05-20 DIAGNOSIS — L570 Actinic keratosis: Secondary | ICD-10-CM | POA: Diagnosis not present

## 2014-05-20 DIAGNOSIS — L821 Other seborrheic keratosis: Secondary | ICD-10-CM | POA: Diagnosis not present

## 2014-05-20 DIAGNOSIS — D1801 Hemangioma of skin and subcutaneous tissue: Secondary | ICD-10-CM | POA: Diagnosis not present

## 2014-05-20 DIAGNOSIS — Z85828 Personal history of other malignant neoplasm of skin: Secondary | ICD-10-CM | POA: Diagnosis not present

## 2014-05-26 ENCOUNTER — Other Ambulatory Visit: Payer: Self-pay | Admitting: Physician Assistant

## 2014-05-28 ENCOUNTER — Telehealth: Payer: Self-pay | Admitting: Nurse Practitioner

## 2014-05-28 NOTE — Telephone Encounter (Signed)
We changed pravachol to crestor after we got lab work back

## 2014-05-28 NOTE — Telephone Encounter (Signed)
Left this information on pharmacy voicemail.

## 2014-07-24 DIAGNOSIS — H52223 Regular astigmatism, bilateral: Secondary | ICD-10-CM | POA: Diagnosis not present

## 2014-07-24 DIAGNOSIS — H5203 Hypermetropia, bilateral: Secondary | ICD-10-CM | POA: Diagnosis not present

## 2014-07-24 DIAGNOSIS — H25813 Combined forms of age-related cataract, bilateral: Secondary | ICD-10-CM | POA: Diagnosis not present

## 2014-07-24 DIAGNOSIS — H524 Presbyopia: Secondary | ICD-10-CM | POA: Diagnosis not present

## 2014-08-21 ENCOUNTER — Telehealth: Payer: Self-pay | Admitting: Nurse Practitioner

## 2014-08-21 NOTE — Telephone Encounter (Signed)
Pt given appt tomorrow with mmm @ 3:45

## 2014-08-22 ENCOUNTER — Ambulatory Visit (INDEPENDENT_AMBULATORY_CARE_PROVIDER_SITE_OTHER): Payer: Medicare Other | Admitting: Nurse Practitioner

## 2014-08-22 ENCOUNTER — Encounter: Payer: Self-pay | Admitting: Nurse Practitioner

## 2014-08-22 VITALS — BP 136/85 | HR 86 | Temp 97.4°F | Ht 61.0 in | Wt 110.8 lb

## 2014-08-22 DIAGNOSIS — S86899A Other injury of other muscle(s) and tendon(s) at lower leg level, unspecified leg, initial encounter: Secondary | ICD-10-CM | POA: Diagnosis not present

## 2014-08-22 MED ORDER — V-2 HIGH COMPRESSION HOSE MISC: 1.0000 | Freq: Every day | Status: DC

## 2014-08-22 MED ORDER — METHYLPREDNISOLONE ACETATE 80 MG/ML IJ SUSP
80.0000 mg | Freq: Once | INTRAMUSCULAR | Status: AC
  Administered 2014-08-22: 80 mg via INTRAMUSCULAR

## 2014-08-22 NOTE — Patient Instructions (Signed)
Shin Splints Shin splints is a painful condition that is felt on the shinbone or in the muscles on either side of the bone (front of your lower leg). Shin splints happen when physical activities, such as sports or other demanding exercise, leads to inflammation of the muscles, tendons, and the thin layer that covers the shinbone.  CAUSES   Overuse of muscles.  Repetitive activities.  Flat feet or rigid arches. Activities that could contribute to shin splints include:  A sudden increase in exercise time.  Starting a new, demanding activity.  Running up hills or long distances.  Playing sports with sudden starts and stops.  A poor warm up.  Old or worn-out shoes. SYMPTOMS   Pain on the front of the leg.  Pain while exercising or at rest. DIAGNOSIS  Your caregiver will diagnose shin splints from a history of your symptoms and a physical exam. You may be observed as you walk or run. X-ray exams or further testing may be needed to rule out other problems, such as a stress fracture, which also causes lower leg pain. TREATMENT  Your caregiver may decide on the treatment based on your age, history, health, and how bad the pain is. Most cases of shin splints can be managed by one or more of the following:  Resting.  Reducing the length and intensity of your exercise.  Stopping the activity that causes shin pain.  Taking medicines to control the inflammation.  Icing, massaging, stretching, and strengthening the affected area.  Getting shoes with rigid heels, shock absorption, and a good arch support. HOME CARE INSTRUCTIONS   Resume activity steadily or as directed by your caregiver.  Restart your exercise sessions with non-weight-bearing exercises, such as cycling or swimming.  Stop running if the pain returns.  Warm up properly before exercising.  Run on a level and fairly firm surface.  Gradually change the intensity of an exercise.  Limit increases in running  distance by no more than 5 to 10% weekly. This means if you are running 5 miles, you can only increase your run by 1/2 a mile at a time.  Change your athletic shoes every 6 months, or every 350 to 450 miles. SEEK MEDICAL CARE IF:   Symptoms continue or worsen even after treatment.  The location, intensity, or type of pain changes over time. SEEK IMMEDIATE MEDICAL CARE IF:   You have severe pain.  You have trouble walking. MAKE SURE YOU:  Understand these instructions.  Will watch your condition.  Will get help right away if you are not doing well or get worse. Document Released: 09/30/2000 Document Revised: 12/26/2011 Document Reviewed: 03/20/2011 [employer] Patient Information 2015 [employer]. This information is not intended to replace advice given to you by your health care provider. Make sure you discuss any questions you have with your health care provider.  

## 2014-08-22 NOTE — Progress Notes (Signed)
   Subjective:    Patient ID: Sandra Hamilton, female    DOB: September 04, 1936, 77 y.o.   MRN: [id number]  HPI Patient in c/o both shins hurting right down front- walking makes it worse. Has tried advil which has not helped- This started several months- seems to be getting some worse.    Review of Systems  Constitutional: Negative.   HENT: Negative.   Respiratory: Negative.   Cardiovascular: Negative.   Genitourinary: Negative.   Neurological: Negative.   Psychiatric/Behavioral: Negative.   All other systems reviewed and are negative.      Objective:   Physical Exam  Constitutional: She is oriented to person, place, and time. She appears well-developed and well-nourished.  Cardiovascular: Normal rate, regular rhythm and normal heart sounds.   Varicose veins bil shins  Pulmonary/Chest: Effort normal and breath sounds normal.  Musculoskeletal:  Pain down front of both shins- no edema- no erythema  Neurological: She is oriented to person, place, and time.  Skin: Skin is warm.  Psychiatric: She has a normal mood and affect. Her behavior is normal. Judgment and thought content normal.   BP 136/85 mmHg  Pulse 86  Temp(Src) 97.4 F (36.3 C) (Oral)  Ht 5\' 1"  (1.549 m)  Wt 110 lb 12.8 oz (50.259 kg)  BMI 20.95 kg/m2        Assessment & Plan:   1. Shin splints, unspecified laterality, initial encounter    Meds ordered this encounter  Medications  . Elastic Bandages & Supports (V-2 HIGH COMPRESSION HOSE) MISC    Sig: 1 each by Does not apply route daily.    Dispense:  1 each    Refill:  0    Order Specific Question:  Supervising Provider    Answer:  Chipper Herb [[id number]]  . methylPREDNISolone acetate (DEPO-MEDROL) injection 80 mg    Sig:    Rest legs If no better RTO  Mary-Margaret Hassell Done, FNP

## 2014-09-15 ENCOUNTER — Other Ambulatory Visit: Payer: Self-pay | Admitting: Nurse Practitioner

## 2014-09-15 ENCOUNTER — Other Ambulatory Visit: Payer: Self-pay

## 2014-09-15 MED ORDER — ROSUVASTATIN CALCIUM 10 MG PO TABS: 10.0000 mg | ORAL_TABLET | Freq: Every day | ORAL | Status: DC

## 2014-09-15 NOTE — Telephone Encounter (Signed)
Last seen 09/01/14  MMM This med not on EPIC list  Mail order

## 2014-09-22 DIAGNOSIS — H25813 Combined forms of age-related cataract, bilateral: Secondary | ICD-10-CM | POA: Diagnosis not present

## 2014-09-26 ENCOUNTER — Ambulatory Visit: Payer: Medicare Other

## 2014-09-26 ENCOUNTER — Ambulatory Visit (INDEPENDENT_AMBULATORY_CARE_PROVIDER_SITE_OTHER): Payer: Medicare Other

## 2014-09-26 ENCOUNTER — Ambulatory Visit (INDEPENDENT_AMBULATORY_CARE_PROVIDER_SITE_OTHER): Payer: Medicare Other | Admitting: Nurse Practitioner

## 2014-09-26 ENCOUNTER — Encounter: Payer: Self-pay | Admitting: Nurse Practitioner

## 2014-09-26 VITALS — BP 129/79 | HR 97 | Temp 97.9°F | Ht 61.0 in | Wt 103.0 lb

## 2014-09-26 DIAGNOSIS — S32502A Unspecified fracture of left pubis, initial encounter for closed fracture: Secondary | ICD-10-CM | POA: Diagnosis not present

## 2014-09-26 DIAGNOSIS — S32592A Other specified fracture of left pubis, initial encounter for closed fracture: Secondary | ICD-10-CM

## 2014-09-26 DIAGNOSIS — M25562 Pain in left knee: Secondary | ICD-10-CM | POA: Diagnosis not present

## 2014-09-26 MED ORDER — HYDROCODONE-ACETAMINOPHEN 5-325 MG PO TABS: 1.0000 | ORAL_TABLET | Freq: Four times a day (QID) | ORAL | Status: DC | PRN

## 2014-09-26 NOTE — Progress Notes (Signed)
   Subjective:    Patient ID: Sandra Hamilton, female    DOB: 29-Oct-1935, 78 y.o.   MRN: [id number]  HPI Patient in saying  That she fell 2 weeks ago and is having pain in left leg. Hurts to walk on but sh eis able to walk.    Review of Systems  Constitutional: Negative.   HENT: Negative.   Respiratory: Negative.   Cardiovascular: Negative.   Genitourinary: Negative.   Neurological: Negative.   Psychiatric/Behavioral: Negative.   All other systems reviewed and are negative.      Objective:   Physical Exam  Constitutional: She is oriented to person, place, and time. She appears well-developed and well-nourished.  Cardiovascular: Normal rate and normal heart sounds.   Pulmonary/Chest: Effort normal and breath sounds normal.  Musculoskeletal:  Pain with slr on left leg Pelvic pain on palpation  Neurological: She is alert and oriented to person, place, and time.  Skin: Skin is warm.  echymosis of left lateral thigh  Psychiatric: She has a normal mood and affect. Her behavior is normal. Judgment and thought content normal.   BP 129/79 mmHg  Pulse 97  Temp(Src) 97.9 F (36.6 C) (Oral)  Ht 5\' 1"  (1.549 m)  Wt 103 lb (46.72 kg)  BMI 19.47 kg/m2  Left hip x ray- Superior and inferior pubic rami fractures on the left side.- Preliminary reading by Ronnald Collum, FNP  [facility]       Assessment & Plan:   1. Pain in joint, lower leg, left   2. Fracture of multiple pubic rami, left, closed, initial encounter    Meds ordered this encounter  Medications  . HYDROcodone-acetaminophen (LORTAB) 5-325 MG per tablet    Sig: Take 1 tablet by mouth every 6 (six) hours as needed for moderate pain.    Dispense:  60 tablet    Refill:  0    Order Specific Question:  Supervising Provider    Answer:  Chipper Herb [[id number]]     Stay off legs and rest as much as possible Handicap form filled out for DMV RTO prn  Mary-Margaret Hassell Done, FNP

## 2014-09-26 NOTE — Patient Instructions (Signed)
Stable Pelvic Fracture °You have one or more fractures (this means there is a break in the bones) of the pelvis. The pelvis is the ring of bones that make up your hipbones. These are the bones you sit on and the lower part of the spine. It is like a boney ring where your legs attach and which supports your upper body. You have an undisplaced fracture. This means the bones are in good position. The pelvic fracture you have is a simple (uncomplicated) fracture. °DIAGNOSIS  °X-rays usually diagnose these fractures. °TREATMENT  °The goal of treating pelvic fractures is to get the bones to heal in a good position. The patient should return to normal activities as soon as possible. Such fractures are often treated with normal bed rest and conservative measures.  °HOME CARE INSTRUCTIONS  °· You should be on bed rest for as long as directed by your caregiver. Change positions of your legs every 1-2 hours to maintain good blood flow. You may sit as long as is tolerable. Following this, you may do usual activities, but avoid strenuous activities for as long as directed by your caregiver. °· Only take over-the-counter or prescription medicines for pain, discomfort, or fever as directed by your caregiver. °· Bed rest may also be used for discomfort. °· Resume your activities when you are able. Use a cane or crutch on the injured side to reduce pain while walking, as needed. °· If you develop increased pain or discomfort not relieved with medications, contact your caregiver. °· Warning: Do not drive a car or operate a motor vehicle until your caregiver specifically tells you it is safe to do so. °SEEK IMMEDIATE MEDICAL CARE IF:  °· You feel light-headed or faint, develop chest pain or shortness of breath. °· An unexplained oral temperature above 102° F (38.9° C) develops. °· You develop blood in the urine or in the stools. °· There is difficulty urinating, and/or having a bowel movement, or pain with these efforts. °· There is a  difficulty or increased pain with walking. °· There is swelling in one or both legs that is not normal. °Document Released: 12/12/2001 Document Revised: 02/17/2014 Document Reviewed: 05/16/2008 °ExitCare® Patient Information ©2015 [employer]. This information is not intended to replace advice given to you by your health care provider. Make sure you discuss any questions you have with your health care provider. ° °

## 2014-09-30 ENCOUNTER — Other Ambulatory Visit: Payer: Self-pay | Admitting: Nurse Practitioner

## 2014-09-30 MED ORDER — HYDROCODONE-ACETAMINOPHEN 5-325 MG PO TABS: 1.0000 | ORAL_TABLET | Freq: Four times a day (QID) | ORAL | Status: DC | PRN

## 2014-10-08 ENCOUNTER — Inpatient Hospital Stay (HOSPITAL_COMMUNITY): Admission: RE | Admit: 2014-10-08 | Payer: Medicare Other | Source: Ambulatory Visit

## 2014-10-08 ENCOUNTER — Telehealth: Payer: Self-pay | Admitting: Nurse Practitioner

## 2014-10-08 NOTE — Telephone Encounter (Signed)
She has been doing ok during the days when sitting up but has not been able to sleep at night due to pain. Family thinks she could benefit from a sleep medicine so she could get some rest to help with recovery.  Please send in a script.  Thanks

## 2014-10-09 MED ORDER — HYDROCODONE-ACETAMINOPHEN 5-325 MG PO TABS: 1.0000 | ORAL_TABLET | Freq: Four times a day (QID) | ORAL | Status: DC | PRN

## 2014-10-09 NOTE — Telephone Encounter (Signed)
Hydrocodone rx ready for pick up- pt aware

## 2014-10-13 ENCOUNTER — Ambulatory Visit (HOSPITAL_COMMUNITY): Admission: RE | Admit: 2014-10-13 | Payer: Medicare Other | Source: Ambulatory Visit | Admitting: Ophthalmology

## 2014-10-13 ENCOUNTER — Encounter (HOSPITAL_COMMUNITY): Admission: RE | Payer: Self-pay | Source: Ambulatory Visit

## 2014-10-13 ENCOUNTER — Telehealth: Payer: Self-pay | Admitting: Nurse Practitioner

## 2014-10-13 SURGERY — PHACOEMULSIFICATION, CATARACT, WITH IOL INSERTION
Anesthesia: Monitor Anesthesia Care | Site: Eye | Laterality: Left

## 2014-10-13 NOTE — Telephone Encounter (Signed)
Insurance will nt cover that so you may have to find someone on your own. Call Sandra Hamilton at office and see if she knows of anyone that can help you.

## 2014-10-14 NOTE — Telephone Encounter (Signed)
Aware, she would have to get some one and possibly pay for them to stay.

## 2014-10-23 ENCOUNTER — Ambulatory Visit (INDEPENDENT_AMBULATORY_CARE_PROVIDER_SITE_OTHER): Payer: Medicare Other

## 2014-10-23 ENCOUNTER — Encounter: Payer: Self-pay | Admitting: Nurse Practitioner

## 2014-10-23 ENCOUNTER — Ambulatory Visit (INDEPENDENT_AMBULATORY_CARE_PROVIDER_SITE_OTHER): Payer: Medicare Other | Admitting: Nurse Practitioner

## 2014-10-23 VITALS — BP 133/80 | HR 103 | Temp 96.9°F | Ht 61.0 in | Wt 98.0 lb

## 2014-10-23 DIAGNOSIS — S32502D Unspecified fracture of left pubis, subsequent encounter for fracture with routine healing: Secondary | ICD-10-CM

## 2014-10-23 DIAGNOSIS — S32592D Other specified fracture of left pubis, subsequent encounter for fracture with routine healing: Secondary | ICD-10-CM

## 2014-10-23 DIAGNOSIS — S32592K Other specified fracture of left pubis, subsequent encounter for fracture with nonunion: Secondary | ICD-10-CM

## 2014-10-23 DIAGNOSIS — S32502K Unspecified fracture of left pubis, subsequent encounter for fracture with nonunion: Secondary | ICD-10-CM | POA: Diagnosis not present

## 2014-10-23 NOTE — Patient Instructions (Signed)
Stable Pelvic Fracture °You have one or more fractures (this means there is a break in the bones) of the pelvis. The pelvis is the ring of bones that make up your hipbones. These are the bones you sit on and the lower part of the spine. It is like a boney ring where your legs attach and which supports your upper body. You have an undisplaced fracture. This means the bones are in good position. The pelvic fracture you have is a simple (uncomplicated) fracture. °DIAGNOSIS  °X-rays usually diagnose these fractures. °TREATMENT  °The goal of treating pelvic fractures is to get the bones to heal in a good position. The patient should return to normal activities as soon as possible. Such fractures are often treated with normal bed rest and conservative measures.  °HOME CARE INSTRUCTIONS  °· You should be on bed rest for as long as directed by your caregiver. Change positions of your legs every 1-2 hours to maintain good blood flow. You may sit as long as is tolerable. Following this, you may do usual activities, but avoid strenuous activities for as long as directed by your caregiver. °· Only take over-the-counter or prescription medicines for pain, discomfort, or fever as directed by your caregiver. °· Bed rest may also be used for discomfort. °· Resume your activities when you are able. Use a cane or crutch on the injured side to reduce pain while walking, as needed. °· If you develop increased pain or discomfort not relieved with medications, contact your caregiver. °· Warning: Do not drive a car or operate a motor vehicle until your caregiver specifically tells you it is safe to do so. °SEEK IMMEDIATE MEDICAL CARE IF:  °· You feel light-headed or faint, develop chest pain or shortness of breath. °· An unexplained oral temperature above 102° F (38.9° C) develops. °· You develop blood in the urine or in the stools. °· There is difficulty urinating, and/or having a bowel movement, or pain with these efforts. °· There is a  difficulty or increased pain with walking. °· There is swelling in one or both legs that is not normal. °Document Released: 12/12/2001 Document Revised: 02/17/2014 Document Reviewed: 05/16/2008 °ExitCare® Patient Information ©2015 [employer]. This information is not intended to replace advice given to you by your health care provider. Make sure you discuss any questions you have with your health care provider. ° °

## 2014-10-23 NOTE — Progress Notes (Signed)
   Subjective:    Patient ID: Sandra Hamilton, female    DOB: January 08, 1936, 79 y.o.   MRN: [id number]  HPI Patient fell and fractured her left pubic rami at the end of November- she is here today for recheck. She has really been hurting and has had to take pain meds. Still painful to walk but is a little better then it was.  Review of Systems  Constitutional: Negative.   HENT: Negative.   Respiratory: Negative.   Cardiovascular: Negative.   Genitourinary: Negative.   Neurological: Negative.   Psychiatric/Behavioral: Negative.   All other systems reviewed and are negative.      Objective:   Physical Exam  Constitutional: She is oriented to person, place, and time. She appears well-developed and well-nourished. No distress.  Cardiovascular: Normal rate, regular rhythm and normal heart sounds.   Pulmonary/Chest: Effort normal and breath sounds normal.  Musculoskeletal:  Walker with a walker Pain with movement of left hip  Neurological: She is alert and oriented to person, place, and time.  Skin: Skin is warm and dry.  Psychiatric: She has a normal mood and affect. Her behavior is normal. Judgment and thought content normal.    BP 133/80 mmHg  Pulse 103  Temp(Src) 96.9 F (36.1 C) (Oral)  Ht 5\' 1"  (1.549 m)  Wt 98 lb (44.453 kg)  BMI 18.53 kg/m2  Pelvic- nonhealing fracture of left pubic rami- Preliminary reading by Ronnald Collum, FNP  [facility]       Assessment & Plan:   1. Fracture of multiple pubic rami, left, with nonunion, subsequent encounter    Orders Placed This Encounter  Procedures  . DG Pelvis 1-2 Views    Standing Status: Future     Number of Occurrences: 1     Standing Expiration Date: 12/23/2015    Order Specific Question:  Reason for Exam (SYMPTOM  OR DIAGNOSIS REQUIRED)    Answer:  fracture    Order Specific Question:  Preferred imaging location?    Answer:  Internal  . Ambulatory referral to Orthopedic Surgery    Referral Priority:  Urgent    Referral Type:   Surgical    Referral Reason:  Specialty Services Required    Requested Specialty:  Orthopedic Surgery    Number of Visits Requested:  1   Very limited weight bearing until sees ortho  Mary-Margaret Hassell Done, FNP

## 2014-10-30 DIAGNOSIS — M25552 Pain in left hip: Secondary | ICD-10-CM | POA: Diagnosis not present

## 2014-10-31 ENCOUNTER — Telehealth: Payer: Self-pay | Admitting: Nurse Practitioner

## 2014-10-31 MED ORDER — HYDROCODONE-ACETAMINOPHEN 5-325 MG PO TABS: 1.0000 | ORAL_TABLET | Freq: Four times a day (QID) | ORAL | Status: DC | PRN

## 2014-10-31 NOTE — Telephone Encounter (Signed)
Patient aware rx ready to be picked up 

## 2014-10-31 NOTE — Telephone Encounter (Signed)
Hydrocodone rx ready for pick up 

## 2014-11-06 ENCOUNTER — Other Ambulatory Visit: Payer: Self-pay | Admitting: Nurse Practitioner

## 2014-11-06 NOTE — Patient Instructions (Signed)
Your procedure is scheduled on: 11/13/2014  Report to [facility] at  49  AM.  Call this number if you have problems the morning of surgery: [phone]   Do not eat food or drink liquids :After Midnight.      Take these medicines the morning of surgery with A SIP OF WATER: amlodipine, hydrocodone   Do not wear jewelry, make-up or nail polish.  Do not wear lotions, powders, or perfumes. You may wear deodorant.  Do not shave 48 hours prior to surgery.  Do not bring valuables to the hospital.  Contacts, dentures or bridgework may not be worn into surgery.  Leave suitcase in the car. After surgery it may be brought to your room.  For patients admitted to the hospital, checkout time is 11:00 AM the day of discharge.   Patients discharged the day of surgery will not be allowed to drive home.  :     Please read over the following fact sheets that you were given: Coughing and Deep Breathing, Surgical Site Infection Prevention, Anesthesia Post-op Instructions and Care and Recovery After Surgery    Cataract A cataract is a clouding of the lens of the eye. When a lens becomes cloudy, vision is reduced based on the degree and nature of the clouding. Many cataracts reduce vision to some degree. Some cataracts make people more near-sighted as they develop. Other cataracts increase glare. Cataracts that are ignored and become worse can sometimes look Heather. The Fuhrman color can be seen through the pupil. CAUSES   Aging. However, cataracts may occur at any age, even in newborns.   Certain drugs.   Trauma to the eye.   Certain diseases such as diabetes.   Specific eye diseases such as chronic inflammation inside the eye or a sudden attack of a rare form of glaucoma.   Inherited or acquired medical problems.  SYMPTOMS   Gradual, progressive drop in vision in the affected eye.   Severe, rapid visual loss. This most often happens when trauma is the cause.  DIAGNOSIS  To detect a cataract, an eye  doctor examines the lens. Cataracts are best diagnosed with an exam of the eyes with the pupils enlarged (dilated) by drops.  TREATMENT  For an early cataract, vision may improve by using different eyeglasses or stronger lighting. If that does not help your vision, surgery is the only effective treatment. A cataract needs to be surgically removed when vision loss interferes with your everyday activities, such as driving, reading, or watching TV. A cataract may also have to be removed if it prevents examination or treatment of another eye problem. Surgery removes the cloudy lens and usually replaces it with a substitute lens (intraocular lens, IOL).  At a time when both you and your doctor agree, the cataract will be surgically removed. If you have cataracts in both eyes, only one is usually removed at a time. This allows the operated eye to heal and be out of danger from any possible problems after surgery (such as infection or poor wound healing). In rare cases, a cataract may be doing damage to your eye. In these cases, your caregiver may advise surgical removal right away. The vast majority of people who have cataract surgery have better vision afterward. HOME CARE INSTRUCTIONS  If you are not planning surgery, you may be asked to do the following:  Use different eyeglasses.   Use stronger or brighter lighting.   Ask your eye doctor about reducing your medicine dose  or changing medicines if it is thought that a medicine caused your cataract. Changing medicines does not make the cataract go away on its own.   Become familiar with your surroundings. Poor vision can lead to injury. Avoid bumping into things on the affected side. You are at a higher risk for tripping or falling.   Exercise extreme care when driving or operating machinery.   Wear sunglasses if you are sensitive to bright light or experiencing problems with glare.  SEEK IMMEDIATE MEDICAL CARE IF:   You have a worsening or sudden  vision loss.   You notice redness, swelling, or increasing pain in the eye.   You have a fever.  Document Released: 10/03/2005 Document Revised: 09/22/2011 Document Reviewed: 05/27/2011 [facility] Patient Information 2012 [place].PATIENT INSTRUCTIONS POST-ANESTHESIA  IMMEDIATELY FOLLOWING SURGERY:  Do not drive or operate machinery for the first twenty four hours after surgery.  Do not make any important decisions for twenty four hours after surgery or while taking narcotic pain medications or sedatives.  If you develop intractable nausea and vomiting or a severe headache please notify your doctor immediately.  FOLLOW-UP:  Please make an appointment with your surgeon as instructed. You do not need to follow up with anesthesia unless specifically instructed to do so.  WOUND CARE INSTRUCTIONS (if applicable):  Keep a dry clean dressing on the anesthesia/puncture wound site if there is drainage.  Once the wound has quit draining you may leave it open to air.  Generally you should leave the bandage intact for twenty four hours unless there is drainage.  If the epidural site drains for more than 36-48 hours please call the anesthesia department.  QUESTIONS?:  Please feel free to call your physician or the hospital operator if you have any questions, and they will be happy to assist you.

## 2014-11-10 ENCOUNTER — Encounter (HOSPITAL_COMMUNITY)
Admission: RE | Admit: 2014-11-10 | Discharge: 2014-11-10 | Disposition: A | Discharge status: Home / Self Care | Payer: Medicare Other | Source: Ambulatory Visit | Attending: Ophthalmology | Admitting: Ophthalmology

## 2014-11-13 ENCOUNTER — Encounter (HOSPITAL_COMMUNITY): Admission: RE | Payer: Self-pay | Source: Ambulatory Visit

## 2014-11-13 ENCOUNTER — Ambulatory Visit (HOSPITAL_COMMUNITY): Admission: RE | Admit: 2014-11-13 | Payer: Medicare Other | Source: Ambulatory Visit | Admitting: Ophthalmology

## 2014-11-13 SURGERY — PHACOEMULSIFICATION, CATARACT, WITH IOL INSERTION
Anesthesia: Monitor Anesthesia Care | Site: Eye | Laterality: Left

## 2014-12-12 DIAGNOSIS — M25562 Pain in left knee: Secondary | ICD-10-CM | POA: Diagnosis not present

## 2014-12-12 DIAGNOSIS — S52592D Other fractures of lower end of left radius, subsequent encounter for closed fracture with routine healing: Secondary | ICD-10-CM | POA: Diagnosis not present

## 2014-12-12 DIAGNOSIS — M25552 Pain in left hip: Secondary | ICD-10-CM | POA: Diagnosis not present

## 2014-12-24 ENCOUNTER — Other Ambulatory Visit: Payer: Self-pay | Admitting: Nurse Practitioner

## 2014-12-31 ENCOUNTER — Telehealth: Payer: Self-pay | Admitting: Nurse Practitioner

## 2014-12-31 NOTE — Telephone Encounter (Signed)
Pt given appt Monday 3/21 at 8:15 with MMM.

## 2015-01-05 ENCOUNTER — Encounter: Payer: Self-pay | Admitting: Nurse Practitioner

## 2015-01-05 ENCOUNTER — Ambulatory Visit (INDEPENDENT_AMBULATORY_CARE_PROVIDER_SITE_OTHER): Payer: Medicare Other | Admitting: Nurse Practitioner

## 2015-01-05 VITALS — BP 144/84 | HR 88 | Temp 96.9°F | Ht 61.0 in | Wt 96.0 lb

## 2015-01-05 DIAGNOSIS — Z681 Body mass index (BMI) 19 or less, adult: Secondary | ICD-10-CM

## 2015-01-05 DIAGNOSIS — E785 Hyperlipidemia, unspecified: Secondary | ICD-10-CM

## 2015-01-05 DIAGNOSIS — I10 Essential (primary) hypertension: Secondary | ICD-10-CM | POA: Diagnosis not present

## 2015-01-05 DIAGNOSIS — S86899A Other injury of other muscle(s) and tendon(s) at lower leg level, unspecified leg, initial encounter: Secondary | ICD-10-CM | POA: Insufficient documentation

## 2015-01-05 DIAGNOSIS — S86899D Other injury of other muscle(s) and tendon(s) at lower leg level, unspecified leg, subsequent encounter: Secondary | ICD-10-CM

## 2015-01-05 MED ORDER — AMLODIPINE BESYLATE 5 MG PO TABS: ORAL_TABLET | ORAL | Status: DC

## 2015-01-05 MED ORDER — ENSURE ACTIVE HIGH PROTEIN PO LIQD: 1.0000 | Freq: Every day | ORAL | Status: DC

## 2015-01-05 NOTE — Patient Instructions (Signed)
Shin Splints Shin splints is a painful condition that is felt on the shinbone or in the muscles on either side of the bone (front of your lower leg). Shin splints happen when physical activities, such as sports or other demanding exercise, leads to inflammation of the muscles, tendons, and the thin layer that covers the shinbone.  CAUSES   Overuse of muscles.  Repetitive activities.  Flat feet or rigid arches. Activities that could contribute to shin splints include:  A sudden increase in exercise time.  Starting a new, demanding activity.  Running up hills or long distances.  Playing sports with sudden starts and stops.  A poor warm up.  Old or worn-out shoes. SYMPTOMS   Pain on the front of the leg.  Pain while exercising or at rest. DIAGNOSIS  Your caregiver will diagnose shin splints from a history of your symptoms and a physical exam. You may be observed as you walk or run. X-ray exams or further testing may be needed to rule out other problems, such as a stress fracture, which also causes lower leg pain. TREATMENT  Your caregiver may decide on the treatment based on your age, history, health, and how bad the pain is. Most cases of shin splints can be managed by one or more of the following:  Resting.  Reducing the length and intensity of your exercise.  Stopping the activity that causes shin pain.  Taking medicines to control the inflammation.  Icing, massaging, stretching, and strengthening the affected area.  Getting shoes with rigid heels, shock absorption, and a good arch support. HOME CARE INSTRUCTIONS   Resume activity steadily or as directed by your caregiver.  Restart your exercise sessions with non-weight-bearing exercises, such as cycling or swimming.  Stop running if the pain returns.  Warm up properly before exercising.  Run on a level and fairly firm surface.  Gradually change the intensity of an exercise.  Limit increases in running  distance by no more than 5 to 10% weekly. This means if you are running 5 miles, you can only increase your run by 1/2 a mile at a time.  Change your athletic shoes every 6 months, or every 350 to 450 miles. SEEK MEDICAL CARE IF:   Symptoms continue or worsen even after treatment.  The location, intensity, or type of pain changes over time. SEEK IMMEDIATE MEDICAL CARE IF:   You have severe pain.  You have trouble walking. MAKE SURE YOU:  Understand these instructions.  Will watch your condition.  Will get help right away if you are not doing well or get worse. Document Released: 09/30/2000 Document Revised: 12/26/2011 Document Reviewed: 03/20/2011 [facility] Patient Information 2015 [place], [place]. This information is not intended to replace advice given to you by your health care provider. Make sure you discuss any questions you have with your health care provider.

## 2015-01-05 NOTE — Progress Notes (Signed)
   Subjective:    Patient ID: Sandra Hamilton, female    DOB: December 17, 1935, 79 y.o.   MRN: [id number]  HPI Patient in today c/o "Shin splints"- says that her lower legs hurt all the time and it is making it difficult to get around- SHe fall 6 months ago and injured her pelvis and she has not been doing well since. She has lost weight and has trouble walking and is having to walk with a cane now. Has seen Dr. Alvan Dame and he told her that she has arthritis in both hips and knees. Just taking tylenol.  *  Both ears are stopped up today.  Hypertension This is a chronic problem. The current episode started more than 1 year ago. The problem is unchanged. The problem is controlled. Pertinent negatives include no anxiety, chest pain, headaches, malaise/fatigue, palpitations, peripheral edema or shortness of breath. There are no associated agents to hypertension. Risk factors for coronary artery disease include post-menopausal state. Past treatments include calcium channel blockers. The current treatment provides significant improvement.  Hyperlipidemia Currently on pravachol without side effects- watches diet and has actually lost weight since last visit. SHe deneies any muscle aches.     Review of Systems  Constitutional: Negative.   HENT: Negative.   Respiratory: Negative.   Cardiovascular: Negative.   Gastrointestinal: Negative.   Genitourinary: Negative.   Neurological: Negative.   Psychiatric/Behavioral: Negative.   All other systems reviewed and are negative.      Objective:   Physical Exam  Constitutional: She is oriented to person, place, and time. She appears well-developed and well-nourished.  HENT:  Nose: Nose normal.  Mouth/Throat: Oropharynx is clear and moist.  Eyes: EOM are normal.  Neck: Trachea normal, normal range of motion and full passive range of motion without pain. Neck supple. No JVD present. Carotid bruit is not present. No thyromegaly present.  Cardiovascular: Normal  rate, regular rhythm, normal heart sounds and intact distal pulses.  Exam reveals no gallop and no friction rub.   No murmur heard. Pulmonary/Chest: Effort normal and breath sounds normal.  Abdominal: Soft. Bowel sounds are normal. She exhibits no distension and no mass. There is no tenderness.  Musculoskeletal: Normal range of motion.  Lymphadenopathy:    She has no cervical adenopathy.  Neurological: She is alert and oriented to person, place, and time. She has normal reflexes.  Skin: Skin is warm and dry.  Psychiatric: She has a normal mood and affect. Her behavior is normal. Judgment and thought content normal.    BP 144/84 mmHg  Pulse 88  Temp(Src) 96.9 F (36.1 C) (Oral)  Wt   Bil cerumen impaction- s/p tm clear     Assessment & Plan:  1. Essential hypertension Do not add slat to diet - CMP14+EGFR - amLODipine (NORVASC) 5 MG tablet; TAKE 1 TABLET (5 MG TOTAL) BY MOUTH DAILY.  Dispense: 90 tablet; Refill: 1  2. Hyperlipidemia with target LDL less than 100 - NMR, lipoprofile  3. Shin splints, unspecified laterality, subsequent encounter Information given  4. Low BMI - eat 3 meals a day -ensure daily   Labs pending Health maintenance reviewed Diet and exercise encouraged Continue all meds Follow up  In 3 months   [place], FNP

## 2015-01-06 LAB — CMP14+EGFR
ALBUMIN: 4.6 g/dL (ref 3.5–4.8)
ALT: 14 IU/L (ref 0–32)
AST: 21 IU/L (ref 0–40)
Albumin/Globulin Ratio: 1.8 (ref 1.1–2.5)
Alkaline Phosphatase: 124 IU/L — ABNORMAL HIGH (ref 39–117)
BUN/Creatinine Ratio: 20 (ref 11–26)
BUN: 14 mg/dL (ref 8–27)
Bilirubin Total: 0.5 mg/dL (ref 0.0–1.2)
CALCIUM: 10.3 mg/dL (ref 8.7–10.3)
CHLORIDE: 99 mmol/L (ref 97–108)
CO2: 26 mmol/L (ref 18–29)
Creatinine, Ser: 0.7 mg/dL (ref 0.57–1.00)
GFR calc Af Amer: 97 mL/min/{1.73_m2} (ref >59)
GFR calc non Af Amer: 84 mL/min/{1.73_m2} (ref >59)
GLUCOSE: 87 mg/dL (ref 65–99)
Globulin, Total: 2.6 g/dL (ref 1.5–4.5)
POTASSIUM: 4.3 mmol/L (ref 3.5–5.2)
Sodium: 143 mmol/L (ref 134–144)
TOTAL PROTEIN: 7.2 g/dL (ref 6.0–8.5)

## 2015-01-06 LAB — NMR, LIPOPROFILE
Cholesterol: 145 mg/dL (ref 100–199)
HDL Cholesterol by NMR: 70 mg/dL (ref >39)
HDL Particle Number: 35.1 umol/L (ref >=30.5)
LDL PARTICLE NUMBER: 687 nmol/L (ref <1000)
LDL SIZE: 20.7 nm (ref >20.5)
LDL-C: 61 mg/dL (ref 0–99)
LP-IR SCORE: 31 (ref <=45)
Small LDL Particle Number: 295 nmol/L (ref <=527)
Triglycerides by NMR: 70 mg/dL (ref 0–149)

## 2015-01-16 ENCOUNTER — Ambulatory Visit (INDEPENDENT_AMBULATORY_CARE_PROVIDER_SITE_OTHER): Payer: Medicare Other | Admitting: Physician Assistant

## 2015-01-16 ENCOUNTER — Encounter: Payer: Self-pay | Admitting: Physician Assistant

## 2015-01-16 VITALS — BP 136/78 | HR 86 | Temp 97.1°F | Ht 61.0 in | Wt 105.0 lb

## 2015-01-16 DIAGNOSIS — E162 Hypoglycemia, unspecified: Secondary | ICD-10-CM | POA: Diagnosis not present

## 2015-01-16 DIAGNOSIS — R42 Dizziness and giddiness: Secondary | ICD-10-CM

## 2015-01-16 NOTE — Progress Notes (Signed)
   Subjective:    Patient ID: Sandra Hamilton, female    DOB: [birth year], 79 y.o.   MRN: [id number]  HPI 79 y/o female presents with c/o "felt like she was passing out when the hairdresser laid her head back today" while she was washing her hair at noon. However, she did not have syncope. Ate oatmeal prior to her hair appt. States that she" has just not felt well recently"   Review of Systems  Constitutional: Positive for fatigue and unexpected weight change (has been drinking ensure over the past 3 months in attempts to gain weight).  HENT: Negative.   Eyes: Positive for visual disturbance (blurred vision during episode today ).  Respiratory: Negative.   Cardiovascular: Negative.   Gastrointestinal: Negative.   Endocrine: Positive for polyuria. Negative for cold intolerance, heat intolerance, polydipsia and polyphagia.  Genitourinary: Negative.   Musculoskeletal: Negative.   Skin: Negative.   Psychiatric/Behavioral: Negative.        Objective:   Physical Exam  Constitutional: She is oriented to person, place, and time. No distress.  Recent labs reviewed and were WNL Thin frame, mild appearing malnourished pleasant  HENT:  Head: Normocephalic.  Right Ear: External ear normal.  Eyes: Pupils are equal, round, and reactive to light.  Cardiovascular: Normal rate, regular rhythm, normal heart sounds and intact distal pulses.   Pulmonary/Chest: Effort normal and breath sounds normal. No respiratory distress. She has no wheezes. She has no rales. She exhibits no tenderness.  Abdominal: Soft. Bowel sounds are normal.  Neurological: She is alert and oriented to person, place, and time.  Skin: She is not diaphoretic.  Psychiatric: She has a normal mood and affect. Her behavior is normal. Judgment and thought content normal.  Nursing note and vitals reviewed.         Assessment & Plan:  1. Hypoglycemia 2. Dizziness  - Due to patients history of her diet, I feel that she is not  eating enough and suffered an episode of hypoglycemia. She had a small meal last night at 6pm, oatmeal this morning at 6am prior to her hair appt around noon when the episode occurred. Since she felt better after eating when she got home and has not had another episode, I have discussed with her in depth the importance of good eating habits and instructions were given for proper diet/nutrition. She assures me that she will start eating better and will f/u in 3-4 weeks for recheck. At that time, if there is not improvement/weight gain, I will likely prescribe a medication to increase her appetite.   F/U in 3-4 weeks.   Report to ER or rtc if further dizziness episodes occur for further assessment

## 2015-01-16 NOTE — Patient Instructions (Signed)
High Protein, High Calorie Diet A high protein, high calorie diet increases the amount of protein and calories you eat. You may need more protein and calories in your diet because of illness, surgery, injury, weight loss, or having a poor appetite. Eating high protein and high calorie foods can help you gain weight, heal, and recover after illness.  SERVING SIZES Measuring foods and serving sizes helps to make sure you are getting the right amount of food. The list below tells how big or small some common serving sizes are.   1 oz.........4 stacked dice.  3 oz........Marland KitchenDeck of cards.  1 tsp.......Marland KitchenTip of little finger.  1 tbs......Marland KitchenMarland KitchenThumb.  2 tbs.......Marland KitchenGolf ball.   cup......Marland KitchenHalf of a fist.  1 cup.......Marland KitchenA fist. HIGH PROTEIN FOODS Dairy  Whole milk.  Whole milk yogurt.  Powdered milk.  Cheese.  Yahoo.  Instant breakfast products.  Eggnog. Tips for adding to your diet:  Use whole milk when making hot cereal, puddings, soups, and hot cocoa.  Add powdered milk to baked goods, smoothies, and milkshakes.  Make whole milk yogurt parfaits by adding granola, fruit, or nuts.  Add cheese to sandwiches, pastas, soups, and casseroles.  Add fruit to cottage cheese. Meat   Beef, pork, and poultry.  Fish and seafood.  Peanut butter.  Dried beans.  Eggs. Tips for adding to your diet:  Make meat and cheese omelets.  Add eggs to salads and baked goods.  Add fish and seafood to salads.  Add meat and poultry to casseroles, salads, and soups.  Use peanut butter as a topping for pretzels, celery, crackers, or add it to baked goods.  Use beans in casseroles, dips, and spreads. GENERAL GUIDELINES TO INCREASE CALORIES  Replace calorie-free drinks with calorie-containing drinks, such as milk, fruit juices, regular soda, milkshakes, and hot chocolate.  Try to eat 6 small meals instead of 3 large meals each day.  Keep snacks handy, such as nuts, trail mixes,  dried fruit, and yogurt.  Choose foods with sauces and gravies.  Add dried fruits, honey, and half-and-half to hot or cold cereal.  Add extra fats when possible, such as butter, sour cream, cream cheese, and salad dressings.  Add cheese to foods often.  Consider adding a clear liquid nutritional supplement to your diet. Your caregiver can give you recommendations. HIGH CALORIE FOODS Grain/Starch  Baked goods, such as muffins and quick breads.  Croissants.  Pancakes and waffles. Vegetable   Sauted vegetables in oil.  Fried vegetables.  Salad greens with regular salad dressing or vinegar and oil. Fruit  Dried fruit.  Canned fruit in syrup.  Fruit juice. Fat  Avocado.  Butter or margarine.  Whipped cream.  Mayonnaise.  Salad dressing.  Peanuts and mixed nuts.  Cream cheese and sour cream. Sweets and Dessert  Cake.  Cookies.  Pie.  Ice cream.  Doughnuts and pastries.  Protein and meal replacement bars.  Jam, preserves, and jelly.  Candy bars.  Chocolate.  Chocolate, caramel, or other flavored syrups. Document Released: 10/03/2005 Document Revised: 12/26/2011 Document Reviewed: 07/06/2007 [facility] Patient Information 2015 [place], [place]. This information is not intended to replace advice given to you by your health care provider. Make sure you discuss any questions you have with your health care provider. Low Blood Sugar Low blood sugar (hypoglycemia) means that the level of sugar in your blood is lower than it should be. Signs of low blood sugar include:  Getting sweaty.  Feeling hungry.  Feeling dizzy or weak.  Feeling sleepier  than normal.  Feeling nervous.  Headaches.  Having a fast heartbeat. Low blood sugar can happen fast and can be an emergency. Your doctor can do tests to check your blood sugar level. You can have low blood sugar and not have diabetes. HOME CARE  Check your blood sugar as told by your doctor. If it is  less than 70 mg/dl or as told by your doctor, take 1 of the following:  3 to 4 glucose tablets.   cup clear juice.   cup soda pop, not diet.  1 cup milk.  5 to 6 hard candies.  Recheck blood sugar after 15 minutes. Repeat until it is at the right level.  Eat a snack if it is more than 1 hour until the next meal.  Only take medicine as told by your doctor.  Do not skip meals. Eat on time.  Do not drink alcohol except with meals.  Check your blood glucose before driving.  Check your blood glucose before and after exercise.  Always carry treatment with you, such as glucose pills.  Always wear a medical alert bracelet if you have diabetes. GET HELP RIGHT AWAY IF:   Your blood glucose goes below 70 mg/dl or as told by your doctor, and you:  Are confused.  Are not able to swallow.  Pass out (faint).  You cannot treat yourself. You may need someone to help you.  You have low blood sugar problems often.  You have problems from your medicines.  You are not feeling better after 3 to 4 days.  You have vision changes. MAKE SURE YOU:   Understand these instructions.  Will watch this condition.  Will get help right away if you are not doing well or get worse. Document Released: 12/28/2009 Document Revised: 12/26/2011 Document Reviewed: 12/28/2009 [facility] Patient Information 2015 [place], [place]. This information is not intended to replace advice given to you by your health care provider. Make sure you discuss any questions you have with your health care provider.

## 2015-01-28 ENCOUNTER — Encounter: Payer: Self-pay | Admitting: Family

## 2015-01-28 ENCOUNTER — Ambulatory Visit (INDEPENDENT_AMBULATORY_CARE_PROVIDER_SITE_OTHER): Payer: Medicare Other | Admitting: Family

## 2015-01-28 VITALS — BP 138/87 | HR 83 | Temp 97.5°F | Ht 61.0 in | Wt 104.8 lb

## 2015-01-28 DIAGNOSIS — H8111 Benign paroxysmal vertigo, right ear: Secondary | ICD-10-CM | POA: Diagnosis not present

## 2015-01-28 DIAGNOSIS — H6121 Impacted cerumen, right ear: Secondary | ICD-10-CM | POA: Diagnosis not present

## 2015-01-28 MED ORDER — MECLIZINE HCL 50 MG PO TABS: 50.0000 mg | ORAL_TABLET | Freq: Three times a day (TID) | ORAL | Status: DC | PRN

## 2015-01-28 NOTE — Patient Instructions (Addendum)
Benign Positional Vertigo Vertigo means you feel like you or your surroundings are moving when they are not. Benign positional vertigo is the most common form of vertigo. Benign means that the cause of your condition is not serious. Benign positional vertigo is more common in older adults. CAUSES  Benign positional vertigo is the result of an upset in the labyrinth system. This is an area in the middle ear that helps control your balance. This may be caused by a viral infection, head injury, or repetitive motion. However, often no specific cause is found. SYMPTOMS  Symptoms of benign positional vertigo occur when you move your head or eyes in different directions. Some of the symptoms may include:  Loss of balance and falls.  Vomiting.  Blurred vision.  Dizziness.  Nausea.  Involuntary eye movements (nystagmus). DIAGNOSIS  Benign positional vertigo is usually diagnosed by physical exam. If the specific cause of your benign positional vertigo is unknown, your caregiver may perform imaging tests, such as magnetic resonance imaging (MRI) or computed tomography (CT). TREATMENT  Your caregiver may recommend movements or procedures to correct the benign positional vertigo. Medicines such as meclizine, benzodiazepines, and medicines for nausea may be used to treat your symptoms. In rare cases, if your symptoms are caused by certain conditions that affect the inner ear, you may need surgery. HOME CARE INSTRUCTIONS   Follow your caregiver's instructions.  Move slowly. Do not make sudden body or head movements.  Avoid driving.  Avoid operating heavy machinery.  Avoid performing any tasks that would be dangerous to you or others during a vertigo episode.  Drink enough fluids to keep your urine clear or pale yellow. SEEK IMMEDIATE MEDICAL CARE IF:   You develop problems with walking, weakness, numbness, or using your arms, hands, or legs.  You have difficulty speaking.  You develop  severe headaches.  Your nausea or vomiting continues or gets worse.  You develop visual changes.  Your family or friends notice any behavioral changes.  Your condition gets worse.  You have a fever.  You develop a stiff neck or sensitivity to light. MAKE SURE YOU:   Understand these instructions.  Will watch your condition.  Will get help right away if you are not doing well or get worse. Document Released: 07/11/2006 Document Revised: 12/26/2011 Document Reviewed: 06/23/2011 [facility] Patient Information 2015 [place], [place]. This information is not intended to replace advice given to you by your health care provider. Make sure you discuss any questions you have with your health care provider.  Cerumen Impaction A cerumen impaction is when the wax in your ear forms a plug. This plug usually causes reduced hearing. Sometimes it also causes an earache or dizziness. Removing a cerumen impaction can be difficult and painful. The wax sticks to the ear canal. The canal is sensitive and bleeds easily. If you try to remove a heavy wax buildup with a cotton tipped swab, you may push it in further. Irrigation with water, suction, and small ear curettes may be used to clear out the wax. If the impaction is fixed to the skin in the ear canal, ear drops may be needed for a few days to loosen the wax. People who build up a lot of wax frequently can use ear wax removal products available in your local drugstore. SEEK MEDICAL CARE IF:  You develop an earache, increased hearing loss, or marked dizziness. Document Released: 11/10/2004 Document Revised: 12/26/2011 Document Reviewed: 12/31/2009 [facility] Patient Information 2015 [place], [place]. This information is  not intended to replace advice given to you by your health care provider. Make sure you discuss any questions you have with your health care provider.

## 2015-01-28 NOTE — Progress Notes (Signed)
   Subjective:    Patient ID: Sandra Hamilton, female    DOB: 1936-02-15, 79 y.o.   MRN: [id number]  Dizziness This is a new problem. The current episode started in the past 7 days. The problem occurs intermittently. Associated symptoms include vertigo. Pertinent negatives include no chest pain, chills, congestion, coughing, fatigue, headaches, myalgias, neck pain, numbness, sore throat or weakness. The symptoms are aggravated by bending. She has tried nothing for the symptoms. The treatment provided no relief.      Review of Systems  Constitutional: Negative for chills and fatigue.  HENT: Negative.  Negative for congestion and sore throat.   Eyes: Negative.   Respiratory: Negative.  Negative for cough and shortness of breath.   Cardiovascular: Negative.  Negative for chest pain and palpitations.  Gastrointestinal: Negative.   Endocrine: Negative.   Genitourinary: Negative.   Musculoskeletal: Negative.  Negative for myalgias and neck pain.  Neurological: Positive for dizziness and vertigo. Negative for weakness, numbness and headaches.  Hematological: Negative.   Psychiatric/Behavioral: Negative.   All other systems reviewed and are negative.      Objective:   Physical Exam  Constitutional: She is oriented to person, place, and time. She appears well-developed and well-nourished. No distress.  HENT:  Head: Normocephalic and atraumatic.  Right Ear: External ear normal.  Left Ear: External ear normal.  Nose: Nose normal.  Mouth/Throat: Oropharynx is clear and moist.  Eyes: Pupils are equal, round, and reactive to light.  Neck: Normal range of motion. Neck supple. No thyromegaly present.  Cardiovascular: Normal rate, regular rhythm, normal heart sounds and intact distal pulses.   No murmur heard. Pulmonary/Chest: Effort normal and breath sounds normal. No respiratory distress. She has no wheezes.  Abdominal: Soft. Bowel sounds are normal. She exhibits no distension. There is no  tenderness.  Musculoskeletal: Normal range of motion. She exhibits no edema or tenderness.  Neurological: She is alert and oriented to person, place, and time. She has normal reflexes. No cranial nerve deficit.  Skin: Skin is warm and dry.  Psychiatric: She has a normal mood and affect. Her behavior is normal. Judgment and thought content normal.  Vitals reviewed.   BP 138/87 mmHg  Pulse 83  Temp(Src) 97.5 F (36.4 C) (Oral)  Ht 5\' 1"  (1.549 m)  Wt 104 lb 12.8 oz (47.537 kg)  BMI 19.81 kg/m2       Assessment & Plan:  1. BPV (benign positional vertigo), right -Falls precaution discussed -Avoid sudden movements -RTO prn - meclizine (ANTIVERT) 50 MG tablet; Take 1 tablet (50 mg total) by mouth 3 (three) times daily as needed.  Dispense: 30 tablet; Refill: 0  2. Cerumen impaction, right -Wax drops as needed -Do not pick at [place], FNP

## 2015-02-11 DIAGNOSIS — S32511A Fracture of superior rim of right pubis, initial encounter for closed fracture: Secondary | ICD-10-CM | POA: Diagnosis not present

## 2015-03-31 ENCOUNTER — Ambulatory Visit (INDEPENDENT_AMBULATORY_CARE_PROVIDER_SITE_OTHER): Payer: Medicare Other | Admitting: Family Medicine

## 2015-03-31 ENCOUNTER — Encounter: Payer: Self-pay | Admitting: Family Medicine

## 2015-03-31 VITALS — BP 133/72 | HR 91 | Temp 97.0°F | Ht 61.0 in | Wt 101.2 lb

## 2015-03-31 DIAGNOSIS — M461 Sacroiliitis, not elsewhere classified: Secondary | ICD-10-CM | POA: Diagnosis not present

## 2015-03-31 MED ORDER — METHYLPREDNISOLONE 4 MG PO TBPK: ORAL_TABLET | ORAL | Status: DC

## 2015-03-31 NOTE — Progress Notes (Signed)
Subjective:  Patient ID: Sandra Hamilton, female    DOB: Sep 13, 1936  Age: 79 y.o. MRN: [id number]  CC: Back Pain   HPI Sandra Hamilton presents for several days of increasing right lower back pain. She has a history of arthritis. She had a pelvic fracture in January and has been on a cane or walker ever since then. This pain has developed due to the one-sided numbness of her gait. She favors the left side and that's been hard on the right side. The pain is moderate at 6/10. It is constant. It has been worsened by ambulation. Patient is walking with a quad cane. It does radiate to the right posterior eye and leg.   History Sandra Hamilton has a past medical history of Hypertension and Irritable bowel syndrome.   She has past surgical history that includes Abdominal hysterectomy; Appendectomy; Thyroidectomy; and Breast biopsy.   Her family history is not on file.She reports that she has never smoked. She has never used smokeless tobacco. She reports that she does not drink alcohol or use illicit drugs.  Outpatient Prescriptions Prior to Visit  Medication Sig Dispense Refill  . amLODipine (NORVASC) 5 MG tablet TAKE 1 TABLET (5 MG TOTAL) BY MOUTH DAILY. 90 tablet 1  . aspirin EC 81 MG tablet Take 81 mg by mouth daily.    . calcium carbonate (OS-CAL) 600 MG TABS tablet Take 600 mg by mouth daily with breakfast.    . Elastic Bandages & Supports (V-2 HIGH COMPRESSION HOSE) MISC 1 each by Does not apply route daily. 1 each 0  . fluticasone (FLONASE) 50 MCG/ACT nasal spray PLACE 2 SPRAYS INTO BOTH NOSTRILS DAILY. 16 g 3  . Misc Natural Products (OSTEO BI-FLEX JOINT SHIELD PO) Apply 1 application topically as needed.    . Nutritional Supplements (ENSURE ACTIVE HIGH PROTEIN) LIQD Take 1 Can by mouth daily. 30 Can 5  . Omega-3 Fatty Acids (FISH OIL) 1200 MG CAPS Take 1 capsule by mouth 2 (two) times daily.    . meclizine (ANTIVERT) 50 MG tablet Take 1 tablet (50 mg total) by mouth 3 (three) times daily as needed.  (Patient not taking: Reported on 03/31/2015) 30 tablet 0   No facility-administered medications prior to visit.    ROS Review of Systems  Constitutional: Negative for fever, chills, diaphoresis, appetite change, fatigue and unexpected weight change.  HENT: Negative for congestion, ear pain, hearing loss, postnasal drip, rhinorrhea, sneezing, sore throat and trouble swallowing.   Eyes: Negative for pain.  Respiratory: Negative for cough, chest tightness and shortness of breath.   Cardiovascular: Negative for chest pain and palpitations.  Gastrointestinal: Negative for nausea, vomiting, abdominal pain, diarrhea and constipation.  Genitourinary: Negative for dysuria, frequency and menstrual problem.  Musculoskeletal: Positive for myalgias, back pain, arthralgias and gait problem.  Skin: Negative for rash.  Neurological: Negative for dizziness, weakness, numbness and headaches.  Psychiatric/Behavioral: Negative for dysphoric mood and agitation.    Objective:  BP 133/72 mmHg  Pulse 91  Temp(Src) 97 F (36.1 C) (Oral)  Ht 5\' 1"  (1.549 m)  Wt 101 lb 3.2 oz (45.904 kg)  BMI 19.13 kg/m2  BP Readings from Last 3 Encounters:  03/31/15 133/72  01/28/15 138/87  01/16/15 136/78    Wt Readings from Last 3 Encounters:  03/31/15 101 lb 3.2 oz (45.904 kg)  01/28/15 104 lb 12.8 oz (47.537 kg)  01/16/15 105 lb (47.628 kg)     Physical Exam  Constitutional: She is oriented to person, place,  and time. She appears well-developed and well-nourished. No distress.  HENT:  Head: Normocephalic and atraumatic.  Eyes: Conjunctivae and EOM are normal. Pupils are equal, round, and reactive to light.  Cardiovascular: Normal rate, regular rhythm and normal heart sounds.   No murmur heard. Pulmonary/Chest: Effort normal and breath sounds normal. No respiratory distress. She has no wheezes. She has no rales.  Abdominal: Soft.  Musculoskeletal: She exhibits tenderness (tenderness over the right SI  joint. Patient unable to perform Patrick's sign due to arthritis in the hips. Straight leg raise is negative.).  Neurological: She is alert and oriented to person, place, and time. She has normal reflexes.  Skin: Skin is warm and dry.  Psychiatric: She has a normal mood and affect. Her behavior is normal. Judgment and thought content normal.    No results found for: HGBA1C  Lab Results  Component Value Date   WBC 6.2 06/11/2013   HGB 15.3* 06/11/2013   HCT 45.0 06/11/2013   GLUCOSE 87 01/05/2015   CHOL 145 01/05/2015   TRIG 70 01/05/2015   HDL 70 01/05/2015   LDLCALC 126* 05/05/2014   ALT 14 01/05/2015   AST 21 01/05/2015   NA 143 01/05/2015   K 4.3 01/05/2015   CL 99 01/05/2015   CREATININE 0.70 01/05/2015   BUN 14 01/05/2015   CO2 26 01/05/2015    No results found.  Assessment & Plan:   Sandra Hamilton was seen today for back pain.  Diagnoses and all orders for this visit:  Sacroiliac inflammation  Other orders -     methylPREDNISolone (MEDROL DOSEPAK) 4 MG TBPK tablet; Start with 4 for 3 days. Then 3,2 &1 for three days each   I have discontinued Sandra Hamilton's meclizine. I am also having her start on methylPREDNISolone. Additionally, I am having her maintain her Fish Oil, calcium carbonate, aspirin EC, Misc Natural Products (OSTEO BI-FLEX JOINT SHIELD PO), fluticasone, V-2 HIGH COMPRESSION HOSE, amLODipine, and ENSURE ACTIVE HIGH PROTEIN.  Meds ordered this encounter  Medications  . methylPREDNISolone (MEDROL DOSEPAK) 4 MG TBPK tablet    Sig: Start with 4 for 3 days. Then 3,2 &1 for three days each    Dispense:  30 tablet    Refill:  0     Follow-up: Return in about 2 weeks (around 04/14/2015), or if symptoms worsen or fail to improve.  Claretta Fraise, M.D.

## 2015-04-14 ENCOUNTER — Ambulatory Visit (INDEPENDENT_AMBULATORY_CARE_PROVIDER_SITE_OTHER): Payer: Medicare Other | Admitting: Family Medicine

## 2015-04-14 ENCOUNTER — Encounter: Payer: Self-pay | Admitting: Family Medicine

## 2015-04-14 VITALS — BP 123/74 | HR 77 | Temp 97.3°F | Ht 61.0 in | Wt 97.8 lb

## 2015-04-14 DIAGNOSIS — S86899D Other injury of other muscle(s) and tendon(s) at lower leg level, unspecified leg, subsequent encounter: Secondary | ICD-10-CM

## 2015-04-14 MED ORDER — MELOXICAM 7.5 MG PO TABS: 7.5000 mg | ORAL_TABLET | Freq: Every day | ORAL | Status: DC

## 2015-04-14 NOTE — Progress Notes (Signed)
Subjective:  Patient ID: Sandra Hamilton, female    DOB: Mar 21, 1936  Age: 79 y.o. MRN: [id number]  CC: Pain   HPI Sandra Hamilton presents for continued pain in both shins. There was partial relief with the previous medication. She has not been able to pursue her walking regimen due to 6-7/10 pain. It is present primarily when exerting herself with walking more than a few steps. It is not involving the knees or the ankles. It is simply on the anterior aspect of the shin. This extends from the tibial tuberosity to the malleoli. The pain is equal bilaterally. It is a dull ache with occasional sharpness. No cramping. It does not bother her at night while resting.  History Sandra Hamilton has a past medical history of Hypertension and Irritable bowel syndrome.   She has past surgical history that includes Abdominal hysterectomy; Appendectomy; Thyroidectomy; and Breast biopsy.   Her family history is not on file.She reports that she has never smoked. She has never used smokeless tobacco. She reports that she does not drink alcohol or use illicit drugs.  Outpatient Prescriptions Prior to Visit  Medication Sig Dispense Refill  . amLODipine (NORVASC) 5 MG tablet TAKE 1 TABLET (5 MG TOTAL) BY MOUTH DAILY. 90 tablet 1  . aspirin EC 81 MG tablet Take 81 mg by mouth daily.    . calcium carbonate (OS-CAL) 600 MG TABS tablet Take 600 mg by mouth daily with breakfast.    . Elastic Bandages & Supports (V-2 HIGH COMPRESSION HOSE) MISC 1 each by Does not apply route daily. 1 each 0  . fluticasone (FLONASE) 50 MCG/ACT nasal spray PLACE 2 SPRAYS INTO BOTH NOSTRILS DAILY. 16 g 3  . Misc Natural Products (OSTEO BI-FLEX JOINT SHIELD PO) Apply 1 application topically as needed.    . Nutritional Supplements (ENSURE ACTIVE HIGH PROTEIN) LIQD Take 1 Can by mouth daily. 30 Can 5  . Omega-3 Fatty Acids (FISH OIL) 1200 MG CAPS Take 1 capsule by mouth 2 (two) times daily.    . methylPREDNISolone (MEDROL DOSEPAK) 4 MG TBPK tablet  Start with 4 for 3 days. Then 3,2 &1 for three days each 30 tablet 0   No facility-administered medications prior to visit.    ROS Review of Systems  Constitutional: Negative for fever, chills, diaphoresis, appetite change, fatigue and unexpected weight change.  HENT: Negative for congestion, ear pain, hearing loss, postnasal drip, rhinorrhea, sneezing, sore throat and trouble swallowing.   Eyes: Negative for pain.  Respiratory: Negative for cough, chest tightness and shortness of breath.   Cardiovascular: Negative for chest pain and palpitations.  Gastrointestinal: Negative for nausea, vomiting, abdominal pain, diarrhea and constipation.  Genitourinary: Negative for dysuria, frequency and menstrual problem.  Musculoskeletal: Negative for joint swelling and arthralgias.  Skin: Negative for rash.  Neurological: Negative for dizziness, weakness, numbness and headaches.  Psychiatric/Behavioral: Negative for dysphoric mood and agitation.    Objective:  BP 123/74 mmHg  Pulse 77  Temp(Src) 97.3 F (36.3 C) (Oral)  Ht 5\' 1"  (1.549 m)  Wt 97 lb 12.8 oz (44.362 kg)  BMI 18.49 kg/m2  BP Readings from Last 3 Encounters:  04/14/15 123/74  03/31/15 133/72  01/28/15 138/87    Wt Readings from Last 3 Encounters:  04/14/15 97 lb 12.8 oz (44.362 kg)  03/31/15 101 lb 3.2 oz (45.904 kg)  01/28/15 104 lb 12.8 oz (47.537 kg)     Physical Exam  Constitutional: She is oriented to person, place, and time. She appears  well-developed and well-nourished. No distress.  HENT:  Head: Normocephalic and atraumatic.  Eyes: EOM are normal. Pupils are equal, round, and reactive to light.  Neck: Normal range of motion. Neck supple.  Cardiovascular: Normal rate, regular rhythm and normal heart sounds.   No murmur heard. Pulmonary/Chest: Effort normal and breath sounds normal.  Abdominal: Soft. Bowel sounds are normal.  Musculoskeletal: She exhibits tenderness (both anterior shins from tibial  tuberosity to lateral malleoli). She exhibits no edema.  Neurological: She is alert and oriented to person, place, and time. She has normal reflexes.  Skin: Skin is warm and dry.  Psychiatric: She has a normal mood and affect. Her behavior is normal. Judgment and thought content normal.    No results found for: HGBA1C  Lab Results  Component Value Date   WBC 6.2 06/11/2013   HGB 15.3* 06/11/2013   HCT 45.0 06/11/2013   GLUCOSE 87 01/05/2015   CHOL 145 01/05/2015   TRIG 70 01/05/2015   HDL 70 01/05/2015   LDLCALC 126* 05/05/2014   ALT 14 01/05/2015   AST 21 01/05/2015   NA 143 01/05/2015   K 4.3 01/05/2015   CL 99 01/05/2015   CREATININE 0.70 01/05/2015   BUN 14 01/05/2015   CO2 26 01/05/2015    No results found.  Assessment & Plan:   Sandra Hamilton was seen today for pain.  Diagnoses and all orders for this visit:  Shin splints, unspecified laterality, subsequent encounter Orders: -     Ambulatory referral to Physical Therapy  Other orders -     meloxicam (MOBIC) 7.5 MG tablet; Take 1 tablet (7.5 mg total) by mouth daily.   I have discontinued Sandra Hamilton's methylPREDNISolone. I am also having her start on meloxicam. Additionally, I am having her maintain her Fish Oil, calcium carbonate, aspirin EC, Misc Natural Products (OSTEO BI-FLEX JOINT SHIELD PO), fluticasone, V-2 HIGH COMPRESSION HOSE, amLODipine, and ENSURE ACTIVE HIGH PROTEIN.  Meds ordered this encounter  Medications  . meloxicam (MOBIC) 7.5 MG tablet    Sig: Take 1 tablet (7.5 mg total) by mouth daily.    Dispense:  30 tablet    Refill:  0     Follow-up: Return in about 6 weeks (around 05/26/2015), or if symptoms worsen or fail to improve.  Claretta Fraise, M.D.

## 2015-04-23 ENCOUNTER — Ambulatory Visit: Payer: Medicare Other | Attending: Family Medicine | Admitting: Physical Therapy

## 2015-04-23 DIAGNOSIS — M25569 Pain in unspecified knee: Secondary | ICD-10-CM | POA: Diagnosis not present

## 2015-04-23 DIAGNOSIS — M79604 Pain in right leg: Secondary | ICD-10-CM | POA: Insufficient documentation

## 2015-04-23 DIAGNOSIS — S86899D Other injury of other muscle(s) and tendon(s) at lower leg level, unspecified leg, subsequent encounter: Secondary | ICD-10-CM | POA: Diagnosis not present

## 2015-04-23 DIAGNOSIS — X58XXXD Exposure to other specified factors, subsequent encounter: Secondary | ICD-10-CM | POA: Diagnosis not present

## 2015-04-23 DIAGNOSIS — M79605 Pain in left leg: Secondary | ICD-10-CM | POA: Diagnosis not present

## 2015-04-23 NOTE — Therapy (Signed)
[facility] [place], [place], [address] Phone: [phone]   Fax:  [phone]  Physical Therapy Evaluation  Patient Details  Name: Sandra Hamilton MRN: [id number] Date of Birth: [birth year] Referring Provider:  Claretta Fraise, MD  Encounter Date: 04/23/2015      PT End of Session - 04/23/15 1428    Visit Number 1   Number of Visits 12   Date for PT Re-Evaluation 06/04/15   PT Start Time 0145   PT Stop Time 0228   PT Time Calculation (min) 43 min   Activity Tolerance Patient tolerated treatment well   Behavior During Therapy [facility] for tasks assessed/performed      Past Medical History  Diagnosis Date  . Hypertension   . Irritable bowel syndrome     Past Surgical History  Procedure Laterality Date  . Abdominal hysterectomy    . Appendectomy    . Thyroidectomy    . Breast biopsy      There were no vitals filed for this visit.  Visit Diagnosis:  Pain in joint, lower leg, unspecified laterality - Plan: PT plan of care cert/re-cert  Shin splint, unspecified laterality, subsequent encounter - Plan: PT plan of care cert/re-cert      Subjective Assessment - 04/23/15 1409    Subjective Legs hurt when I walk up inclines.   Patient Stated Goals Walk without pain.   Currently in Pain? Yes   Pain Score 8    Pain Location Leg   Pain Orientation Right;Left   Pain Descriptors / Indicators Aching   Pain Type Chronic pain   Pain Onset More than a month ago   Pain Frequency Constant   Aggravating Factors  Walking up hills.   Pain Relieving Factors Rest.   Multiple Pain Sites No            [facility] PT Assessment - 04/23/15 0001    Assessment   Medical Diagnosis Shin splints.   Precautions   Precautions Fall   Precaution Comments Supervised gait for safety.   Restrictions   Weight Bearing Restrictions No   Balance Screen   Has the patient fallen in the past 6 months No   Has the patient had a decrease in  activity level because of a fear of falling?  No   Is the patient reluctant to leave their home because of a fear of falling?  No   Home Ec[employer] residence   Prior Function   Level of Independence Independent   Cognition   Overall Cognitive Status Within Functional Limits for tasks assessed   ROM / Strength   AROM / PROM / Strength AROM;Strength   AROM   Overall AROM Comments Left active dorsiflexion with knee flexed and extended= 5 degrees and right is essentially full range.   Strength   Overall Strength Comments Normal bilateral LE strength.   Palpation   Palpation comment Tender to palpation along the length of bilateral tibialis anterior muscle immediately adjacent to the tibia.   Ambulation/Gait   Gait Comments Stiff-legged gait pattern and the patient has a tendency to maintain dorsiflexion throught the gait cycle using a QC.                   [facility] Adult PT Treatment/Exercise - 04/23/15 0001    Modalities   Modalities Electrical Stimulation   Electrical Stimulation   Electrical Stimulation Location Bilateral tibialis anterior area   Electrical Stimulation Action Pre-mod e'  stim x 15 minutes (5 sec on and 5 sec off).                  PT Short Term Goals - 2015-04-25 1455    PT SHORT TERM GOAL #1   Title Ind with HEP.   Time 2   Period Weeks   Status New           PT Long Term Goals - 2015-04-25 1455    PT LONG TERM GOAL #1   Title Bilateral active dorsiflxion= 10 degrees.   Time 4   Period Weeks   Status New   PT LONG TERM GOAL #2   Title Walk a community distance with pain not > 3/10.   Time 4   Period Weeks   Status New               Plan - 04-25-15 1450    Clinical Impression Statement The patient states hat for years she was on an outside walking program which included going up an incline.  Sge got to a point that she could no longer due this as her leg pain became intense.  Her pain-level  ranges from 5-8/10 depending on how long she walks.   Pt will benefit from skilled therapeutic intervention in order to improve on the following deficits Abnormal gait;Decreased activity tolerance;Decreased range of motion;Pain   Rehab Potential Good   PT Frequency 3x / week   PT Duration 4 weeks   PT Treatment/Interventions ADLs/Self Care Home Management;Cryotherapy;Air traffic controller;Therapeutic activities;Therapeutic exercise;Passive range of motion;Patient/family education;Manual techniques;Taping   PT Next Visit Plan IASTM to bilateral tibialis anterior muscles; Tibialis anterior strengthening with T-band and ankle isolator; Combo.   Consulted and Agree with Plan of Care Patient          G-Codes - 04/25/2015 1428    Functional Assessment Tool Used Foto.   Functional Limitation Mobility: Walking and moving around   Mobility: Walking and Moving Around Current Status [phone]) At least 40 percent but less than 60 percent impaired, limited or restricted   Mobility: Walking and Moving Around Goal Status [phone]) At least 20 percent but less than 40 percent impaired, limited or restricted       Problem List Patient Active Problem List   Diagnosis Date Noted  . Hyperlipidemia with target LDL less than 100 01/05/2015  . Shin splints 01/05/2015  . Hypertension 04/03/2013    [name], [place]  MPT  2015/04/25, 3:01 PM  [facility] [address] [place], [place], [address] Phone: [phone]   Fax:  [phone]

## 2015-04-27 ENCOUNTER — Ambulatory Visit: Payer: Medicare Other | Admitting: Physical Therapy

## 2015-04-27 ENCOUNTER — Encounter: Payer: Self-pay | Admitting: Physical Therapy

## 2015-04-27 DIAGNOSIS — M25569 Pain in unspecified knee: Secondary | ICD-10-CM

## 2015-04-27 DIAGNOSIS — S86899D Other injury of other muscle(s) and tendon(s) at lower leg level, unspecified leg, subsequent encounter: Secondary | ICD-10-CM | POA: Diagnosis not present

## 2015-04-27 DIAGNOSIS — M79604 Pain in right leg: Secondary | ICD-10-CM | POA: Diagnosis not present

## 2015-04-27 DIAGNOSIS — M79605 Pain in left leg: Secondary | ICD-10-CM | POA: Diagnosis not present

## 2015-04-27 NOTE — Therapy (Signed)
[facility] [place], [place], [address] Phone: [phone]   Fax:  [phone]  Physical Therapy Treatment  Patient Details  Name: Sandra Hamilton MRN: [id number] Date of Birth: 20-May-1936 Referring Provider:  Claretta Fraise, MD  Encounter Date: 04/27/2015      PT End of Session - 04/27/15 1137    Visit Number 2   Number of Visits 12   Date for PT Re-Evaluation 06/04/15   PT Start Time 1117   PT Stop Time 1203   PT Time Calculation (min) 46 min   Activity Tolerance Patient tolerated treatment well   Behavior During Therapy [facility] for tasks assessed/performed      Past Medical History  Diagnosis Date  . Hypertension   . Irritable bowel syndrome     Past Surgical History  Procedure Laterality Date  . Abdominal hysterectomy    . Appendectomy    . Thyroidectomy    . Breast biopsy      There were no vitals filed for this visit.  Visit Diagnosis:  Pain in joint, lower leg, unspecified laterality  Shin splint, unspecified laterality, subsequent encounter      Subjective Assessment - 04/27/15 1136    Subjective States that she used to walk up hills which probably caused this. Bought cane due to legs beginning to hurt her. Walking in grocery store increases BLE pain.   Patient Stated Goals Walk without pain.   Currently in Pain? Yes   Pain Score 9    Pain Location Leg   Pain Orientation Right;Left   Pain Descriptors / Indicators Tightness   Pain Type Chronic pain   Pain Onset More than a month ago            [facility] PT Assessment - 04/27/15 0001    Assessment   Medical Diagnosis Shin splints.                     [facility] Adult PT Treatment/Exercise - 04/27/15 0001    Exercises   Exercises Ankle   Modalities   Modalities Electrical Stimulation   Electrical Stimulation   Electrical Stimulation Location Bilateral tibialis anterior area   Electrical Stimulation Action Pre-Mod   Electrical  Stimulation Parameters 80-150 Hz x15 min   Electrical Stimulation Goals Pain   Manual Therapy   Manual Therapy Myofascial release   Myofascial Release STW/TPR to B Tibalis Anterior to decrease pain and spasm                  PT Short Term Goals - 04/23/15 1455    PT SHORT TERM GOAL #1   Title Ind with HEP.   Time 2   Period Weeks   Status New           PT Long Term Goals - 04/23/15 1455    PT LONG TERM GOAL #1   Title Bilateral active dorsiflxion= 10 degrees.   Time 4   Period Weeks   Status New   PT LONG TERM GOAL #2   Title Walk a community distance with pain not > 3/10.   Time 4   Period Weeks   Status New               Plan - 04/27/15 1206    Clinical Impression Statement Patient tolerated treatment well although she reported not feeling the stimulation like she did in the previous evaluation. Notable tightness observed in B lower extremity musculature especially B Tibialis Anterior.  Tolerated moderate pressure during manual therapy. Normal integumentary response noted folowing removal of the modalties. Experienced decreased tightness following treatment but did not numerical pain rating.   Pt will benefit from skilled therapeutic intervention in order to improve on the following deficits Abnormal gait;Decreased activity tolerance;Decreased range of motion;Pain   Rehab Potential Good   PT Frequency 3x / week   PT Duration 4 weeks   PT Treatment/Interventions ADLs/Self Care Home Management;Cryotherapy;Air traffic controller;Therapeutic activities;Therapeutic exercise;Passive range of motion;Patient/family education;Manual techniques;Taping   PT Next Visit Plan IASTM to bilateral tibialis anterior muscles; Tibialis anterior strengthening with T-band and ankle isolator; Combo.   Consulted and Agree with Plan of Care Patient        Problem List Patient Active Problem List   Diagnosis Date Noted  . Hyperlipidemia  with target LDL less than 100 01/05/2015  . Shin splints 01/05/2015  . Hypertension 04/03/2013    Wynelle Fanny, PTA 04/27/2015, 12:11 PM  [facility] [address] [place], [place], [address] Phone: [phone]   Fax:  [phone]

## 2015-04-30 ENCOUNTER — Ambulatory Visit: Payer: Medicare Other | Admitting: Physical Therapy

## 2015-04-30 DIAGNOSIS — S86899D Other injury of other muscle(s) and tendon(s) at lower leg level, unspecified leg, subsequent encounter: Secondary | ICD-10-CM

## 2015-04-30 DIAGNOSIS — M79605 Pain in left leg: Secondary | ICD-10-CM | POA: Diagnosis not present

## 2015-04-30 DIAGNOSIS — M25569 Pain in unspecified knee: Secondary | ICD-10-CM | POA: Diagnosis not present

## 2015-04-30 DIAGNOSIS — M79604 Pain in right leg: Secondary | ICD-10-CM | POA: Diagnosis not present

## 2015-04-30 NOTE — Therapy (Signed)
[facility] [place], [place], [address] Phone: [phone]   Fax:  [phone]  Physical Therapy Treatment  Patient Details  Name: Sandra Hamilton MRN: [id number] Date of Birth: 1936-08-23 Referring Provider:  Claretta Fraise, MD  Encounter Date: 04/30/2015      PT End of Session - 04/30/15 1455    Visit Number 3   Number of Visits 12   Date for PT Re-Evaluation 06/04/15   PT Start Time 7903   PT Stop Time 1344   PT Time Calculation (min) 59 min   Activity Tolerance Patient tolerated treatment well   Behavior During Therapy [facility] for tasks assessed/performed      Past Medical History  Diagnosis Date  . Hypertension   . Irritable bowel syndrome     Past Surgical History  Procedure Laterality Date  . Abdominal hysterectomy    . Appendectomy    . Thyroidectomy    . Breast biopsy      There were no vitals filed for this visit.  Visit Diagnosis:  Pain in joint, lower leg, unspecified laterality  Shin splint, unspecified laterality, subsequent encounter      Subjective Assessment - 04/30/15 1431    Subjective Hurt a lot after last treatment.   Patient Stated Goals Walk without pain.   Pain Score 9    Pain Orientation Right;Left   Pain Descriptors / Indicators --  Hurt.   Pain Type Chronic pain   Pain Onset More than a month ago                         [facility] Adult PT Treatment/Exercise - 04/30/15 1449    Modalities   Modalities Ultrasound   Ultrasound   Ultrasound Location Bil tib ant muscles   Ultrasound Parameters 1.50 W/CM2 x 14 minutes total.   Manual Therapy   Myofascial Release STW/TPR to B Tibialis Anterior to decrease pain and spasm x 10  minutes.      Pre-mod e stim to bil tib ant's x 20 minutes            PT Short Term Goals - 04/23/15 1455    PT SHORT TERM GOAL #1   Title Ind with HEP.   Time 2   Period Weeks   Status New           PT Long Term Goals -  04/23/15 1455    PT LONG TERM GOAL #1   Title Bilateral active dorsiflxion= 10 degrees.   Time 4   Period Weeks   Status New   PT LONG TERM GOAL #2   Title Walk a community distance with pain not > 3/10.   Time 4   Period Weeks   Status New               Problem List Patient Active Problem List   Diagnosis Date Noted  . Hyperlipidemia with target LDL less than 100 01/05/2015  . Shin splints 01/05/2015  . Hypertension 04/03/2013    Antoney Biven, [place] MPT 05/01/2015, 9:18 AM  [facility] [address] [place], [place], [address] Phone: [phone]   Fax:  [phone]

## 2015-05-04 ENCOUNTER — Ambulatory Visit: Payer: Medicare Other | Admitting: Physical Therapy

## 2015-05-04 DIAGNOSIS — S86899D Other injury of other muscle(s) and tendon(s) at lower leg level, unspecified leg, subsequent encounter: Secondary | ICD-10-CM | POA: Diagnosis not present

## 2015-05-04 DIAGNOSIS — M79604 Pain in right leg: Secondary | ICD-10-CM | POA: Diagnosis not present

## 2015-05-04 DIAGNOSIS — M79605 Pain in left leg: Secondary | ICD-10-CM | POA: Diagnosis not present

## 2015-05-04 DIAGNOSIS — M25569 Pain in unspecified knee: Secondary | ICD-10-CM

## 2015-05-04 NOTE — Therapy (Signed)
[facility] [place], [place], [address] Phone: [phone]   Fax:  [phone]  Physical Therapy Treatment  Patient Details  Name: Sandra Hamilton MRN: [id number] Date of Birth: 1936/04/24 Referring Provider:  Claretta Fraise, MD  Encounter Date: 05/04/2015      PT End of Session - 05/04/15 1235    Visit Number 4   Number of Visits 12   Date for PT Re-Evaluation 06/04/15   PT Start Time 1233   PT Stop Time 1334   PT Time Calculation (min) 61 min   Activity Tolerance Patient tolerated treatment well   Behavior During Therapy [facility] for tasks assessed/performed      Past Medical History  Diagnosis Date  . Hypertension   . Irritable bowel syndrome     Past Surgical History  Procedure Laterality Date  . Abdominal hysterectomy    . Appendectomy    . Thyroidectomy    . Breast biopsy      There were no vitals filed for this visit.  Visit Diagnosis:  Pain in joint, lower leg, unspecified laterality  Shin splint, unspecified laterality, subsequent encounter      Subjective Assessment - 05/04/15 1235    Subjective I feel like I'm making progress when I'm here, but when I''m not here it hurts.   Currently in Pain? Yes   Pain Score 3    Pain Location Leg   Pain Orientation Right   Pain Descriptors / Indicators Sharp   Pain Type Chronic pain                         [facility] Adult PT Treatment/Exercise - 05/04/15 0001    Ambulation/Gait   Ambulation/Gait Yes   Ambulation/Gait Assistance 6: Modified independent (Device/Increase time)   Ambulation Distance (Feet) 30 Feet   Assistive device Small based quad cane   Gait Pattern Decreased step length - right;Decreased step length - left;Decreased dorsiflexion - right;Decreased dorsiflexion - left   Gait Comments LOB occurred when PT asked pt to increase hip/knee flexion with gait.   Modalities   Modalities Cryotherapy;Electrical Stimulation;Ultrasound   Cryotherapy   Number Minutes Cryotherapy 15 Minutes   Cryotherapy Location Other (comment)  bi lshins   Type of Cryotherapy Ice pack   Electrical Stimulation   Electrical Stimulation Location Bilateral tibialis anterior area   Electrical Stimulation Action Pre Mod   Electrical Stimulation Parameters 80-150 Hz x 15 min   Electrical Stimulation Goals Pain   Ultrasound   Ultrasound Location Bil ant tib   Ultrasound Parameters 1.5wcm2 1 mhz cont x 10 min   Ultrasound Goals Pain   Manual Therapy   Manual Therapy Soft tissue mobilization   Soft tissue mobilization to bil ant tib   Ankle Exercises: Stretches   Other Stretch bil ant tib stretch in supine 3 x 30 sec each  also demo'd in sitting; good return by pt                PT Education - 05/04/15 1429    Education provided Yes   Education Details ant tib stretch for hep   [employer]) Educated Patient   Methods Explanation;Demonstration;Verbal cues;Handout   Comprehension Verbalized understanding;Returned demonstration          PT Short Term Goals - 04/23/15 1455    PT SHORT TERM GOAL #1   Title Ind with HEP.   Time 2   Period Weeks   Status New  PT Long Term Goals - 04/23/15 1455    PT LONG TERM GOAL #1   Title Bilateral active dorsiflxion= 10 degrees.   Time 4   Period Weeks   Status New   PT LONG TERM GOAL #2   Title Walk a community distance with pain not > 3/10.   Time 4   Period Weeks   Status New               Plan - 05/04/15 1430    Clinical Impression Statement Patient reports she feels better with therapy but continues to have pain with walking. She is still tender with palpation at bil ant tib. She has marked tightness of bil hip IR and ER and in gluteals. Advised patient to minimize active DF as and exercise and instead add ant tib stretch. Her gait is very unsteady with SBQC. Encouraged patient to use walker especially in community, however patient does not want to and at home  the walker is too wide for her hallways.    PT Next Visit Plan continued STW to ant tibialis, ant tib stretching, combo us/estim, hip flexibility.        Problem List Patient Active Problem List   Diagnosis Date Noted  . Hyperlipidemia with target LDL less than 100 01/05/2015  . Shin splints 01/05/2015  . Hypertension 04/03/2013    Madelyn Flavors PT  05/04/2015, 2:42 PM  [facility] [address] [place], [place], [address] Phone: [phone]   Fax:  [phone]

## 2015-05-04 NOTE — Patient Instructions (Signed)
Plantar Flexion: Stretch - Dorsiflexors   Position Helper: Stabilize left leg. Place other hand on top of foot. Motion -Helper presses foot into pointed position. -turning foot inward.-Avoid pressing on toes. Hold _303__ seconds. Repeat ___ times. Repeat with other leg. Do _3-4__ sessions per day.   Madelyn Flavors, PT 05/04/2015 1:22 PM [facility] [place], [place], [address] Phone: [phone]   Fax:  [phone]

## 2015-05-05 ENCOUNTER — Other Ambulatory Visit: Payer: Self-pay | Admitting: Nurse Practitioner

## 2015-05-06 ENCOUNTER — Ambulatory Visit: Payer: Medicare Other | Admitting: Physical Therapy

## 2015-05-06 DIAGNOSIS — M79605 Pain in left leg: Secondary | ICD-10-CM | POA: Diagnosis not present

## 2015-05-06 DIAGNOSIS — M25569 Pain in unspecified knee: Secondary | ICD-10-CM | POA: Diagnosis not present

## 2015-05-06 DIAGNOSIS — S86899D Other injury of other muscle(s) and tendon(s) at lower leg level, unspecified leg, subsequent encounter: Secondary | ICD-10-CM | POA: Diagnosis not present

## 2015-05-06 DIAGNOSIS — M79604 Pain in right leg: Secondary | ICD-10-CM | POA: Diagnosis not present

## 2015-05-06 NOTE — Patient Instructions (Signed)
Gastroc / Heel Cord Stretch - Seated With Towel   Sit on floor, towel around ball of foot. Gently pull foot in toward body, stretching heel cord and calf. Hold for _30__ seconds. Repeat on involved leg. Repeat _3__ times. Do _2-3__ times per day.  Copyright  VHI. All rights reserved.   Ankle Sideward Movement (Inversion / Eversion)   Sitting on bed with legs straight, turn your feet toward each other as shown. Hold 30 seocnds. Repeat ___3_ times. Do ___2_ sessions per day.  http://[email or web address].exer.us/406   Copyright  VHI. All rights reserved.   Madelyn Flavors, PT 05/06/2015 12:05 PM [facility] [place], [place], [address] Phone: [phone]   Fax:  [phone]

## 2015-05-06 NOTE — Therapy (Signed)
[facility] [place], [place], [address] Phone: [phone]   Fax:  [phone]  Physical Therapy Treatment  Patient Details  Name: Sandra Hamilton MRN: [id number] Date of Birth: 03-29-1936 Referring Provider:  Claretta Fraise, MD  Encounter Date: 05/06/2015      PT End of Session - 05/06/15 1121    Visit Number 5   Number of Visits 12   Date for PT Re-Evaluation 06/04/15   PT Start Time 1119   PT Stop Time 1208   PT Time Calculation (min) 49 min   Activity Tolerance Patient tolerated treatment well   Behavior During Therapy [facility] for tasks assessed/performed      Past Medical History  Diagnosis Date  . Hypertension   . Irritable bowel syndrome     Past Surgical History  Procedure Laterality Date  . Abdominal hysterectomy    . Appendectomy    . Thyroidectomy    . Breast biopsy      There were no vitals filed for this visit.  Visit Diagnosis:  Pain in joint, lower leg, unspecified laterality  Shin splint, unspecified laterality, subsequent encounter      Subjective Assessment - 05/06/15 1122    Subjective Patient reports that she was sore agaiin after last treatment but less then the time before. Partially compliant with HEP, but states that it hurts.    Currently in Pain? No/denies                         [facility] Adult PT Treatment/Exercise - 05/06/15 0001    Ambulation/Gait   Ambulation/Gait Yes   Ambulation/Gait Assistance 6: Modified independent (Device/Increase time)   Ambulation Distance (Feet) 20 Feet   Assistive device Rolling walker   Gait Pattern Step-through pattern   Ambulation Surface Level   Gait Comments Significant improvement in safety of gait with rolling walker. No LOB and uses step through gait.   Modalities   Modalities Ultrasound   Ultrasound   Ultrasound Location Bil ant tib   Ultrasound Parameters COMBO 1.5 wcm2 3.3.mhz x 6 min each leg   Ultrasound Goals Pain    Manual Therapy   Manual Therapy Soft tissue mobilization   Soft tissue mobilization to bil ant tib with passive stretch   Ankle Exercises: Stretches   Gastroc Stretch 1 rep;30 seconds   Other Stretch bil ant tib stretch in long sitting 1 x 30 sec each  also demo'd in sitting; good return by pt                PT Education - 05/06/15 1419    Education provided Yes   Education Details hep: towel gastroc stretch and long sitting peroneals/ant tib stretch   Person(s) Educated Patient   Methods Explanation;Demonstration;Verbal cues;Handout   Comprehension Verbalized understanding;Returned demonstration          PT Short Term Goals - 05/06/15 1424    PT SHORT TERM GOAL #1   Title Ind with HEP.   Time 2   Period Weeks   Status Achieved           PT Long Term Goals - 05/06/15 1425    PT LONG TERM GOAL #1   Title Bilateral active dorsiflxion= 10 degrees.   Time 4   Period Weeks   Status On-going   PT LONG TERM GOAL #2   Title Walk a community distance with pain not > 3/10.   Time 4   Period  Weeks   Status On-going               Plan - 05/06/15 1420    Clinical Impression Statement Patient tolerated estim/us combo and manual therapy well. She still has DF deficits on the left ankle and decreased hip mobility affecting gait. Trial x 20 ft with rolling walker as patient left clinic. Her gait is signicantly improved with RW as is safety. PT stressed that patient should be using walker for safety, but patient stated she will not use one. Pt was educated about rollator walker as well, but patient stated she likes her cane.     PT Next Visit Plan continued STW to ant tibialis, ant tib stretching, combo us/estim, hip flexibility. Continue to encourage use of walker vs. cane for safety.        Problem List Patient Active Problem List   Diagnosis Date Noted  . Hyperlipidemia with target LDL less than 100 01/05/2015  . Shin splints 01/05/2015  . Hypertension  04/03/2013    Madelyn Flavors PT  05/06/2015, 2:28 PM  [facility] [address] [place], [place], [address] Phone: [phone]   Fax:  [phone]

## 2015-05-11 ENCOUNTER — Encounter: Payer: Self-pay | Admitting: Physical Therapy

## 2015-05-11 ENCOUNTER — Ambulatory Visit: Payer: Medicare Other | Admitting: Physical Therapy

## 2015-05-11 DIAGNOSIS — M79604 Pain in right leg: Secondary | ICD-10-CM | POA: Diagnosis not present

## 2015-05-11 DIAGNOSIS — M25569 Pain in unspecified knee: Secondary | ICD-10-CM

## 2015-05-11 DIAGNOSIS — M79605 Pain in left leg: Secondary | ICD-10-CM | POA: Diagnosis not present

## 2015-05-11 DIAGNOSIS — S86899D Other injury of other muscle(s) and tendon(s) at lower leg level, unspecified leg, subsequent encounter: Secondary | ICD-10-CM | POA: Diagnosis not present

## 2015-05-11 NOTE — Therapy (Signed)
[facility] [place], [place], [address] Phone: [phone]   Fax:  [phone]  Physical Therapy Treatment  Patient Details  Name: Sandra Hamilton MRN: [id number] Date of Birth: May 22, 1936 Referring Provider:  Claretta Fraise, MD  Encounter Date: 05/11/2015      PT End of Session - 05/11/15 1130    Visit Number 6   Number of Visits 12   Date for PT Re-Evaluation 06/04/15   PT Start Time 1117   PT Stop Time 1211   PT Time Calculation (min) 54 min   Activity Tolerance Patient tolerated treatment well   Behavior During Therapy [facility] for tasks assessed/performed      Past Medical History  Diagnosis Date  . Hypertension   . Irritable bowel syndrome     Past Surgical History  Procedure Laterality Date  . Abdominal hysterectomy    . Appendectomy    . Thyroidectomy    . Breast biopsy      There were no vitals filed for this visit.  Visit Diagnosis:  Pain in joint, lower leg, unspecified laterality  Shin splint, unspecified laterality, subsequent encounter      Subjective Assessment - 05/11/15 1129    Subjective Reports that since previous treatment last week her shins have hurt her. States that she would try to complete exercises but had increased pain and sat at home this weekend reading and went to church but not much else.   Patient Stated Goals Walk without pain.   Currently in Pain? Yes  Stated that she had pain but did not give numerical pain rating prior to treatment.            [facility] PT Assessment - 05/11/15 0001    Assessment   Medical Diagnosis Shin splints.                     [facility] Adult PT Treatment/Exercise - 05/11/15 0001    Modalities   Modalities Electrical Stimulation;Ultrasound   Electrical Stimulation   Electrical Stimulation Location Bilateral tibialis anterior area   Electrical Stimulation Action Pre-Mod   Electrical Stimulation Parameters 80-150 Hz x15 min   Electrical Stimulation Goals Pain   Ultrasound   Ultrasound Location B Anterior Tibialis   Ultrasound Parameters 1.5 w/cm2, 3.3 mhz x6 min each   Ultrasound Goals Pain   Manual Therapy   Manual Therapy Passive ROM   Soft tissue mobilization Passive B gastroc, soleus, anterior tibialis, ankle evertors stretch 3x30 sec                  PT Short Term Goals - 05/06/15 1424    PT SHORT TERM GOAL #1   Title Ind with HEP.   Time 2   Period Weeks   Status Achieved           PT Long Term Goals - 05/06/15 1425    PT LONG TERM GOAL #1   Title Bilateral active dorsiflxion= 10 degrees.   Time 4   Period Weeks   Status On-going   PT LONG TERM GOAL #2   Title Walk a community distance with pain not > 3/10.   Time 4   Period Weeks   Status On-going               Plan - 05/11/15 1214    Clinical Impression Statement Patient tolerated treatment well today without complaint of increased pain. Ambulated into therapy gym with QC again. Tightness noted in B  lower legs in both the calf region and Anterior Tibialis during passive stretching. Normal modalities respones noted following removal of the modalties. Reported experiening "feeling better" following treatment but did not give numerical pain rating.   Pt will benefit from skilled therapeutic intervention in order to improve on the following deficits Abnormal gait;Decreased activity tolerance;Decreased range of motion;Pain   Rehab Potential Good   PT Frequency 3x / week   PT Duration 4 weeks   PT Treatment/Interventions ADLs/Self Care Home Management;Cryotherapy;Air traffic controller;Therapeutic activities;Therapeutic exercise;Passive range of motion;Patient/family education;Manual techniques;Taping   PT Next Visit Plan continued STW to ant tibialis, ant tib stretching, combo us/estim, hip flexibility. Continue to encourage use of walker vs. cane for safety.   Consulted and Agree with  Plan of Care Patient        Problem List Patient Active Problem List   Diagnosis Date Noted  . Hyperlipidemia with target LDL less than 100 01/05/2015  . Shin splints 01/05/2015  . Hypertension 04/03/2013    Wynelle Fanny, PTA 05/11/2015, 12:18 PM  [facility] [address] [place], [place], [address] Phone: [phone]   Fax:  [phone]

## 2015-05-13 ENCOUNTER — Encounter: Payer: Self-pay | Admitting: Physical Therapy

## 2015-05-13 ENCOUNTER — Ambulatory Visit: Payer: Medicare Other | Admitting: Physical Therapy

## 2015-05-13 DIAGNOSIS — M79604 Pain in right leg: Secondary | ICD-10-CM | POA: Diagnosis not present

## 2015-05-13 DIAGNOSIS — S86899D Other injury of other muscle(s) and tendon(s) at lower leg level, unspecified leg, subsequent encounter: Secondary | ICD-10-CM

## 2015-05-13 DIAGNOSIS — M25569 Pain in unspecified knee: Secondary | ICD-10-CM

## 2015-05-13 DIAGNOSIS — M79605 Pain in left leg: Secondary | ICD-10-CM | POA: Diagnosis not present

## 2015-05-13 NOTE — Therapy (Signed)
[facility] [place], [place], [address] Phone: [phone]   Fax:  [phone]  Physical Therapy Treatment  Patient Details  Name: Sandra Hamilton MRN: [id number] Date of Birth: 10/15/1936 Referring Provider:  Claretta Fraise, MD  Encounter Date: 05/13/2015      PT End of Session - 05/13/15 1127    Visit Number 7   Number of Visits 12   Date for PT Re-Evaluation 06/04/15   PT Start Time 2122   PT Stop Time 1214   PT Time Calculation (min) 53 min   Activity Tolerance Patient tolerated treatment well   Behavior During Therapy [facility] for tasks assessed/performed      Past Medical History  Diagnosis Date  . Hypertension   . Irritable bowel syndrome     Past Surgical History  Procedure Laterality Date  . Abdominal hysterectomy    . Appendectomy    . Thyroidectomy    . Breast biopsy      There were no vitals filed for this visit.  Visit Diagnosis:  Pain in joint, lower leg, unspecified laterality  Shin splint, unspecified laterality, subsequent encounter      Subjective Assessment - 05/13/15 1125    Subjective States that her back and legs hurt this morning when she woke and thinks she may have slept wrong. Presented with a strap brace that an orthopedic MD gave her a long time ago that is around her L tibial tuberosity.   Patient Stated Goals Walk without pain.            [facility] PT Assessment - 05/13/15 0001    Assessment   Medical Diagnosis Shin splints.                     [facility] Adult PT Treatment/Exercise - 05/13/15 0001    Modalities   Modalities Electrical Stimulation;Ultrasound   Electrical Stimulation   Electrical Stimulation Location Bilateral tibialis anterior area   Electrical Stimulation Action Pre-Mod   Electrical Stimulation Parameters 80-150 Hz x15 min   Electrical Stimulation Goals Pain   Ultrasound   Ultrasound Location B Tibialis Anterior   Ultrasound Parameters 1.5  w/cm2, 3.3 mhz x12 min total   Ultrasound Goals Pain   Manual Therapy   Manual Therapy Passive ROM   Soft tissue mobilization Passive B gastroc, soleus, anterior tibialis, ankle evertors stretch 3x30 sec with STW/TPR to target muscles to decrease tightness                  PT Short Term Goals - 05/06/15 1424    PT SHORT TERM GOAL #1   Title Ind with HEP.   Time 2   Period Weeks   Status Achieved           PT Long Term Goals - 05/06/15 1425    PT LONG TERM GOAL #1   Title Bilateral active dorsiflxion= 10 degrees.   Time 4   Period Weeks   Status On-going   PT LONG TERM GOAL #2   Title Walk a community distance with pain not > 3/10.   Time 4   Period Weeks   Status On-going               Plan - 05/13/15 1219    Clinical Impression Statement Patient tolerated treamtent well today without complaint of increased pain. Continues to ambulate into therapy gym with QC and decreased step lengths. Tightness continues to be noted in B lower legs but  slightly decreased since previous treatment. Passive stretches were completed today with STW/TPR to the muscles targeted to release the tightness. Normal modalities response noted following removal of the modaltiies. Reported "feeling better" following treatment without giving numerical pain rating.   Pt will benefit from skilled therapeutic intervention in order to improve on the following deficits Abnormal gait;Decreased activity tolerance;Decreased range of motion;Pain   Rehab Potential Good   PT Frequency 3x / week   PT Duration 4 weeks   PT Treatment/Interventions ADLs/Self Care Home Management;Cryotherapy;Air traffic controller;Therapeutic activities;Therapeutic exercise;Passive range of motion;Patient/family education;Manual techniques;Taping   PT Next Visit Plan continued STW to ant tibialis, ant tib stretching, combo us/estim, hip flexibility. Continue to encourage use of walker  vs. cane for safety.   Consulted and Agree with Plan of Care Patient        Problem List Patient Active Problem List   Diagnosis Date Noted  . Hyperlipidemia with target LDL less than 100 01/05/2015  . Shin splints 01/05/2015  . Hypertension 04/03/2013    Wynelle Fanny, PTA 05/13/2015, 12:25 PM  [facility] [address] [place], [place], [address] Phone: [phone]   Fax:  [phone]

## 2015-05-14 ENCOUNTER — Other Ambulatory Visit: Payer: Self-pay | Admitting: Family Medicine

## 2015-05-18 ENCOUNTER — Encounter: Payer: Self-pay | Admitting: Physical Therapy

## 2015-05-18 ENCOUNTER — Ambulatory Visit: Payer: Medicare Other | Attending: Family Medicine | Admitting: Physical Therapy

## 2015-05-18 DIAGNOSIS — M25569 Pain in unspecified knee: Secondary | ICD-10-CM | POA: Diagnosis not present

## 2015-05-18 DIAGNOSIS — S86899D Other injury of other muscle(s) and tendon(s) at lower leg level, unspecified leg, subsequent encounter: Secondary | ICD-10-CM | POA: Diagnosis not present

## 2015-05-18 DIAGNOSIS — R269 Unspecified abnormalities of gait and mobility: Secondary | ICD-10-CM | POA: Diagnosis not present

## 2015-05-18 DIAGNOSIS — X58XXXD Exposure to other specified factors, subsequent encounter: Secondary | ICD-10-CM | POA: Diagnosis not present

## 2015-05-18 NOTE — Therapy (Signed)
[facility] [place], [place], [address] Phone: [phone]   Fax:  [phone]  Physical Therapy Treatment  Patient Details  Name: Sandra Hamilton MRN: [id number] Date of Birth: [birth year] Referring Provider:  Claretta Fraise, MD  Encounter Date: 05/18/2015      PT End of Session - 05/18/15 1127    Visit Number 8   Number of Visits 12   Date for PT Re-Evaluation 06/04/15   PT Start Time 1601   PT Stop Time 1214   PT Time Calculation (min) 53 min   Activity Tolerance Patient tolerated treatment well   Behavior During Therapy [facility] for tasks assessed/performed      Past Medical History  Diagnosis Date  . Hypertension   . Irritable bowel syndrome     Past Surgical History  Procedure Laterality Date  . Abdominal hysterectomy    . Appendectomy    . Thyroidectomy    . Breast biopsy      There were no vitals filed for this visit.  Visit Diagnosis:  Pain in joint, lower leg, unspecified laterality  Shin splint, unspecified laterality, subsequent encounter      Subjective Assessment - 05/18/15 1126    Subjective Reports that her shins feel good following therapy and better for a little bit the next day but does she is not here the pain comes back.   Patient Stated Goals Walk without pain.            [facility] PT Assessment - 05/18/15 0001    Assessment   Medical Diagnosis Shin splints.                     [facility] Adult PT Treatment/Exercise - 05/18/15 0001    Modalities   Modalities Electrical Stimulation;Ultrasound   Electrical Stimulation   Electrical Stimulation Location Bilateral tibialis anterior area   Electrical Stimulation Action Pre-Mod   Electrical Stimulation Parameters 80-150 Hz x15 min   Electrical Stimulation Goals Pain   Ultrasound   Ultrasound Location B Tibialis Anterior   Ultrasound Parameters 1.2 w/cm2, 100%, 1 mhz x12 min total   Ultrasound Goals Pain   Manual Therapy    Manual Therapy Passive ROM   Soft tissue mobilization Passive B gastroc, soleus, anterior tibialis, ankle evertors stretch 3x30 sec with STW/TPR to target muscles to decrease tightness                  PT Short Term Goals - 05/06/15 1424    PT SHORT TERM GOAL #1   Title Ind with HEP.   Time 2   Period Weeks   Status Achieved           PT Long Term Goals - 05/06/15 1425    PT LONG TERM GOAL #1   Title Bilateral active dorsiflxion= 10 degrees.   Time 4   Period Weeks   Status On-going   PT LONG TERM GOAL #2   Title Walk a community distance with pain not > 3/10.   Time 4   Period Weeks   Status On-going               Plan - 05/18/15 1127    Clinical Impression Statement Stimulation was attempted in supine in order to elevate LEs but patient experienced dizziness in supine and stimulation was conducted in sitting. Continues to ambulate into therapy gym with QC and decrease step and stride lengths with flat feet. Normal modalities response noted following removal  of the modalities. Continues to have notable tightness in all bilateral lower leg muscles. Following treatment tightness was palpabably decreased and patient reported feeling better.   Pt will benefit from skilled therapeutic intervention in order to improve on the following deficits Abnormal gait;Decreased activity tolerance;Decreased range of motion;Pain   Rehab Potential Good   PT Frequency 3x / week   PT Duration 4 weeks   PT Treatment/Interventions ADLs/Self Care Home Management;Cryotherapy;Air traffic controller;Therapeutic activities;Therapeutic exercise;Passive range of motion;Patient/family education;Manual techniques;Taping   PT Next Visit Plan continued STW to ant tibialis, ant tib stretching, combo us/estim, hip flexibility. Continue to encourage use of walker vs. cane for safety.   Consulted and Agree with Plan of Care Patient        Problem  List Patient Active Problem List   Diagnosis Date Noted  . Hyperlipidemia with target LDL less than 100 01/05/2015  . Shin splints 01/05/2015  . Hypertension 04/03/2013    Wynelle Fanny, PTA 05/18/2015, 1:41 PM  [facility] [address] [place], [place], [address] Phone: [phone]   Fax:  [phone]

## 2015-05-20 ENCOUNTER — Ambulatory Visit: Payer: Medicare Other | Admitting: Physical Therapy

## 2015-05-20 ENCOUNTER — Encounter: Payer: Self-pay | Admitting: Physical Therapy

## 2015-05-20 DIAGNOSIS — S86899D Other injury of other muscle(s) and tendon(s) at lower leg level, unspecified leg, subsequent encounter: Secondary | ICD-10-CM | POA: Diagnosis not present

## 2015-05-20 DIAGNOSIS — M25569 Pain in unspecified knee: Secondary | ICD-10-CM

## 2015-05-20 DIAGNOSIS — R269 Unspecified abnormalities of gait and mobility: Secondary | ICD-10-CM | POA: Diagnosis not present

## 2015-05-20 NOTE — Therapy (Signed)
[facility] [place], [place], [address] Phone: [phone]   Fax:  [phone]  Physical Therapy Treatment  Patient Details  Name: Sandra Hamilton MRN: [id number] Date of Birth: 04/20/1936 Referring Provider:  Claretta Fraise, MD  Encounter Date: 05/20/2015      PT End of Session - 05/20/15 1127    Visit Number 9   Number of Visits 12   Date for PT Re-Evaluation 06/04/15   PT Start Time 1120   PT Stop Time 1218   PT Time Calculation (min) 58 min   Activity Tolerance Patient tolerated treatment well   Behavior During Therapy [facility] for tasks assessed/performed      Past Medical History  Diagnosis Date  . Hypertension   . Irritable bowel syndrome     Past Surgical History  Procedure Laterality Date  . Abdominal hysterectomy    . Appendectomy    . Thyroidectomy    . Breast biopsy      There were no vitals filed for this visit.  Visit Diagnosis:  Pain in joint, lower leg, unspecified laterality  Shin splint, unspecified laterality, subsequent encounter      Subjective Assessment - 05/20/15 1126    Subjective Reports that her L knee is bothering her but said she may have slept on it. Reports that shins feel about the same. States that she has began using Blue Emu creme.   Patient Stated Goals Walk without pain.   Currently in Pain? Yes  Gave no numerical pain rating for pain            [facility] PT Assessment - 05/20/15 0001    Assessment   Medical Diagnosis Shin splints.                     [facility] Adult PT Treatment/Exercise - 05/20/15 0001    Modalities   Modalities Electrical Stimulation;Ultrasound   Electrical Stimulation   Electrical Stimulation Location Bilateral tibialis anterior area   Electrical Stimulation Action Pre-Mod   Electrical Stimulation Parameters 80-150 H x15 min   Electrical Stimulation Goals Pain   Ultrasound   Ultrasound Location B Tibialis Anterior   Ultrasound  Parameters 1.5 w/cm2, 100%, 1 mh   Ultrasound Goals Pain   Manual Therapy   Manual Therapy Passive ROM   Soft tissue mobilization Passive B gastroc, soleus, anterior tibialis, ankle evertors stretch 3x30 sec with STW/TPR to target muscles to decrease tightness                  PT Short Term Goals - 05/06/15 1424    PT SHORT TERM GOAL #1   Title Ind with HEP.   Time 2   Period Weeks   Status Achieved           PT Long Term Goals - 05/06/15 1425    PT LONG TERM GOAL #1   Title Bilateral active dorsiflxion= 10 degrees.   Time 4   Period Weeks   Status On-going   PT LONG TERM GOAL #2   Title Walk a community distance with pain not > 3/10.   Time 4   Period Weeks   Status On-going               Plan - 05/20/15 1222    Clinical Impression Statement Patient tolerated treatment well today without complaint of increased pain. L ankle appeared to have mild swelling present today and the medial R ankle appeared mildly cyanotic. Continues to  use QC for ambulation with decreased step and stride lengths with feet flat. Normal modalitie response noted following removal of the modalities. Decreased tightness noted in B lower legs today during treatment. Experienced "feeling better" following treatment but stated that pain would return by tomorrow.   Pt will benefit from skilled therapeutic intervention in order to improve on the following deficits Abnormal gait;Decreased activity tolerance;Decreased range of motion;Pain   Rehab Potential Good   PT Frequency 3x / week   PT Duration 4 weeks   PT Treatment/Interventions ADLs/Self Care Home Management;Cryotherapy;Air traffic controller;Therapeutic activities;Therapeutic exercise;Passive range of motion;Patient/family education;Manual techniques;Taping   PT Next Visit Plan continued STW to ant tibialis, ant tib stretching, combo us/estim, hip flexibility. Continue to encourage use of walker vs.  cane for safety.   Consulted and Agree with Plan of Care Patient        Problem List Patient Active Problem List   Diagnosis Date Noted  . Hyperlipidemia with target LDL less than 100 01/05/2015  . Shin splints 01/05/2015  . Hypertension 04/03/2013    Wynelle Fanny, PTA 05/20/2015, 12:26 PM  [facility] [address] [place], [place], [address] Phone: [phone]   Fax:  [phone]

## 2015-05-25 ENCOUNTER — Ambulatory Visit: Payer: Medicare Other | Admitting: Physical Therapy

## 2015-05-25 DIAGNOSIS — M25569 Pain in unspecified knee: Secondary | ICD-10-CM

## 2015-05-25 DIAGNOSIS — S86899D Other injury of other muscle(s) and tendon(s) at lower leg level, unspecified leg, subsequent encounter: Secondary | ICD-10-CM | POA: Diagnosis not present

## 2015-05-25 DIAGNOSIS — R269 Unspecified abnormalities of gait and mobility: Secondary | ICD-10-CM | POA: Diagnosis not present

## 2015-05-25 NOTE — Therapy (Signed)
[facility] [place], [place], [address] Phone: [phone]   Fax:  [phone]  Physical Therapy Treatment  Patient Details  Name: Sandra Hamilton MRN: [id number] Date of Birth: 1936-01-07 Referring Provider:  Claretta Fraise, MD  Encounter Date: 06/01/2015      PT End of Session - 01-Jun-2015 1251    Visit Number 10   Number of Visits 12   Date for PT Re-Evaluation 06/04/15   PT Start Time 01-21-14   PT Stop Time 01-21-1206   PT Time Calculation (min) 52 min   Activity Tolerance Patient tolerated treatment well   Behavior During Therapy [facility] for tasks assessed/performed      Past Medical History  Diagnosis Date  . Hypertension   . Irritable bowel syndrome     Past Surgical History  Procedure Laterality Date  . Abdominal hysterectomy    . Appendectomy    . Thyroidectomy    . Breast biopsy      There were no vitals filed for this visit.  Visit Diagnosis:  Pain in joint, lower leg, unspecified laterality  Shin splint, unspecified laterality, subsequent encounter      Subjective Assessment - 2015-06-01 1247    Subjective Feel real good after a treatment and the the pain comes back.   Patient Stated Goals Walk without pain.   Pain Score 6    Pain Location Leg   Pain Orientation Right   Pain Descriptors / Indicators Aching;Sharp   Pain Type Chronic pain   Pain Onset More than a month ago                                   PT Short Term Goals - 05/06/15 1424    PT SHORT TERM GOAL #1   Title Ind with HEP.   Time 2   Period Weeks   Status Achieved           PT Long Term Goals - 05/06/15 1425    PT LONG TERM GOAL #1   Title Bilateral active dorsiflxion= 10 degrees.   Time 4   Period Weeks   Status On-going   PT LONG TERM GOAL #2   Title Walk a community distance with pain not > 3/10.   Time 4   Period Weeks   Status On-going                 G-Codes - 2015/06/01 Jan 22, 1251    Functional Assessment Tool Used Clinical judgement.   Functional Limitation Mobility: Walking and moving around   Mobility: Walking and Moving Around Current Status [id number]) At least 40 percent but less than 60 percent impaired, limited or restricted   Mobility: Walking and Moving Around Goal Status [phone]) At least 20 percent but less than 40 percent impaired, limited or restricted      Problem List Patient Active Problem List   Diagnosis Date Noted  . Hyperlipidemia with target LDL less than 100 01/05/2015  . Shin splints 01/05/2015  . Hypertension 04/03/2013    Treatment:  Pre-mod e'stim to bilateral anertior tib muscles x 20 minutes f/b deep tissue STW/M x 23 minutes.  Patient felt good after treatment.  Semaj Kham, [place] MPT 06/01/2015, 12:52 PM  [facility] [address] [place], [place], [address] Phone: [phone]   Fax:  [phone]

## 2015-05-27 ENCOUNTER — Encounter: Payer: Self-pay | Admitting: Physical Therapy

## 2015-05-27 ENCOUNTER — Ambulatory Visit: Payer: Medicare Other | Admitting: Physical Therapy

## 2015-05-27 DIAGNOSIS — S86899D Other injury of other muscle(s) and tendon(s) at lower leg level, unspecified leg, subsequent encounter: Secondary | ICD-10-CM | POA: Diagnosis not present

## 2015-05-27 DIAGNOSIS — M25569 Pain in unspecified knee: Secondary | ICD-10-CM | POA: Diagnosis not present

## 2015-05-27 DIAGNOSIS — R269 Unspecified abnormalities of gait and mobility: Secondary | ICD-10-CM | POA: Diagnosis not present

## 2015-05-27 NOTE — Therapy (Signed)
[facility] [place], [place], [address] Phone: [phone]   Fax:  [phone]  Physical Therapy Treatment  Patient Details  Name: Sandra Hamilton MRN: [id number] Date of Birth: 02-15-1936 Referring Provider:  Claretta Fraise, MD  Encounter Date: 05/27/2015      PT End of Session - 05/27/15 1221    Visit Number 11   Number of Visits 12   Date for PT Re-Evaluation 06/04/15   PT Start Time 1117   PT Stop Time 1218   PT Time Calculation (min) 61 min   Activity Tolerance Patient tolerated treatment well   Behavior During Therapy [facility] for tasks assessed/performed      Past Medical History  Diagnosis Date  . Hypertension   . Irritable bowel syndrome     Past Surgical History  Procedure Laterality Date  . Abdominal hysterectomy    . Appendectomy    . Thyroidectomy    . Breast biopsy      There were no vitals filed for this visit.  Visit Diagnosis:  Pain in joint, lower leg, unspecified laterality  Shin splint, unspecified laterality, subsequent encounter      Subjective Assessment - 05/27/15 1127    Subjective Reports that after last treatment she had some relief but it doesn't last. Reports being able to clean for longer periods of time.   Patient Stated Goals Walk without pain.   Currently in Pain? Yes  Reports no numerical rating for pain                         [facility] Adult PT Treatment/Exercise - 05/27/15 0001    Modalities   Modalities Electrical Stimulation;Ultrasound   Electrical Stimulation   Electrical Stimulation Location Bilateral tibialis anterior area   Electrical Stimulation Action Pre-Mod   Electrical Stimulation Parameters 80-150 Hz x15 min   Electrical Stimulation Goals Pain   Ultrasound   Ultrasound Location B Tibialis Anterior   Ultrasound Parameters 1.5 w/cm2, 100%, 1 mhz   Ultrasound Goals Pain   Manual Therapy   Manual Therapy Passive ROM   Soft tissue  mobilization Passive B gastroc, soleus, anterior tibialis, ankle evertors stretch 3x30 sec with STW/TPR to target muscles to decrease tightness                  PT Short Term Goals - 05/06/15 1424    PT SHORT TERM GOAL #1   Title Ind with HEP.   Time 2   Period Weeks   Status Achieved           PT Long Term Goals - 05/06/15 1425    PT LONG TERM GOAL #1   Title Bilateral active dorsiflxion= 10 degrees.   Time 4   Period Weeks   Status On-going   PT LONG TERM GOAL #2   Title Walk a community distance with pain not > 3/10.   Time 4   Period Weeks   Status On-going               Plan - 05/27/15 1222    Clinical Impression Statement Patient continues to tolerate treatment well without complaint of increased pain verbalized. Continues to have the tightness in B lower leg musculature but not to degree that has been observed previously. Continues to use QC with shorted step and stride length as well as flat feet. Patient reports that she does not like the walker and the one at her home is  too big for her because it belonged to her husband. Normal modalites response noted following removal of the modalities. Experienced "feeling better" following treatment today.   Pt will benefit from skilled therapeutic intervention in order to improve on the following deficits Abnormal gait;Decreased activity tolerance;Decreased range of motion;Pain   Rehab Potential Good   PT Frequency 3x / week   PT Duration 4 weeks   PT Treatment/Interventions ADLs/Self Care Home Management;Cryotherapy;Air traffic controller;Therapeutic activities;Therapeutic exercise;Passive range of motion;Patient/family education;Manual techniques;Taping   PT Next Visit Plan continued STW to ant tibialis, ant tib stretching, combo us/estim, hip flexibility. Continue to encourage use of walker vs. cane for safety.   Consulted and Agree with Plan of Care Patient         Problem List Patient Active Problem List   Diagnosis Date Noted  . Hyperlipidemia with target LDL less than 100 01/05/2015  . Shin splints 01/05/2015  . Hypertension 04/03/2013    Wynelle Fanny, PTA 05/27/2015, 12:26 PM  [facility] [address] [place], [place], [address] Phone: [phone]   Fax:  [phone]

## 2015-06-01 ENCOUNTER — Encounter: Payer: Self-pay | Admitting: Physical Therapy

## 2015-06-01 ENCOUNTER — Ambulatory Visit: Payer: Medicare Other | Admitting: Physical Therapy

## 2015-06-01 DIAGNOSIS — R269 Unspecified abnormalities of gait and mobility: Secondary | ICD-10-CM | POA: Diagnosis not present

## 2015-06-01 DIAGNOSIS — S86899D Other injury of other muscle(s) and tendon(s) at lower leg level, unspecified leg, subsequent encounter: Secondary | ICD-10-CM

## 2015-06-01 DIAGNOSIS — M25569 Pain in unspecified knee: Secondary | ICD-10-CM

## 2015-06-01 NOTE — Therapy (Signed)
[facility] [place], [place], [address] Phone: [phone]   Fax:  [phone]  Physical Therapy Treatment  Patient Details  Name: TATYANNA CRONK MRN: [id number] Date of Birth: Apr 02, 1936 Referring Provider:  Claretta Fraise, MD  Encounter Date: 06/01/2015      PT End of Session - 06/01/15 1133    Visit Number 12   Number of Visits 12   Date for PT Re-Evaluation 06/04/15   PT Start Time 1114   PT Stop Time 1159   PT Time Calculation (min) 45 min   Activity Tolerance Patient tolerated treatment well   Behavior During Therapy [facility] for tasks assessed/performed      Past Medical History  Diagnosis Date  . Hypertension   . Irritable bowel syndrome     Past Surgical History  Procedure Laterality Date  . Abdominal hysterectomy    . Appendectomy    . Thyroidectomy    . Breast biopsy      There were no vitals filed for this visit.  Visit Diagnosis:  Pain in joint, lower leg, unspecified laterality  Shin splint, unspecified laterality, subsequent encounter      Subjective Assessment - 06/01/15 1130    Subjective Reports that after last treatment she had some relief but it is temporary relief.   Limitations Walking   Patient Stated Goals Walk without pain.   Currently in Pain? Yes   Pain Score 8    Pain Location Leg   Pain Orientation Right;Left   Pain Descriptors / Indicators Sore;Aching   Pain Type Chronic pain   Pain Onset More than a month ago   Pain Frequency Constant   Aggravating Factors  walking up hills   Effect of Pain on Daily Activities rest                         [facility] Adult PT Treatment/Exercise - 06/01/15 0001    Electrical Stimulation   Electrical Stimulation Location Bilateral tibialis anterior area   Electrical Stimulation Action premod   Electrical Stimulation Parameters 1-10hz    Electrical Stimulation Goals Pain   Ultrasound   Ultrasound Location bil tib ant   Ultrasound Parameters 1.5w/cm2/50%/52mz x177m   Ultrasound Goals Pain   Manual Therapy   Manual Therapy Soft tissue mobilization;Myofascial release   Myofascial Release manual and IASTW to bil LE to decrease pain                  PT Short Term Goals - 05/06/15 1424    PT SHORT TERM GOAL #1   Title Ind with HEP.   Time 2   Period Weeks   Status Achieved           PT Long Term Goals - 06/01/15 1134    PT LONG TERM GOAL #1   Title Bilateral active dorsiflxion= 10 degrees.   Time 4   Period Weeks   Status Achieved  10 degrees bil   PT LONG TERM GOAL #2   Title Walk a community distance with pain not > 3/10.   Time 4   Period Weeks   Status Not Met               Plan - 06/01/15 1143    Clinical Impression Statement Patient feels 10%-20% better overall. Has met all goals except LTG #2 due to pain deficits   Pt will benefit from skilled therapeutic intervention in order to improve on the following deficits  Abnormal gait;Decreased activity tolerance;Decreased range of motion;Pain   Rehab Potential Good   PT Frequency 3x / week   PT Duration 4 weeks   PT Next Visit Plan DC per MPT   Consulted and Agree with Plan of Care Patient          G-Codes - 06-21-15 1431    Functional Assessment Tool Used Clinical judgement.   Functional Limitation Mobility: Walking and moving around   Mobility: Walking and Moving Around Current Status [phone]) At least 40 percent but less than 60 percent impaired, limited or restricted   Mobility: Walking and Moving Around Goal Status [phone]) At least 20 percent but less than 40 percent impaired, limited or restricted   Mobility: Walking and Moving Around Discharge Status [phone]) At least 40 percent but less than 60 percent impaired, limited or restricted      Problem List Patient Active Problem List   Diagnosis Date Noted  . Hyperlipidemia with target LDL less than 100 01/05/2015  . Shin splints 01/05/2015  . Hypertension  04/03/2013   PHYSICAL THERAPY DISCHARGE SUMMARY  Visits from Start of Care: 12  Current functional level related to goals / functional outcomes: Please see above.   Remaining deficits: Goal #2 not met.   Education / Equipment: HEP. Plan: Patient agrees to discharge.  Patient goals were partially met. Patient is being discharged due to meeting the stated rehab goals.  ?????       Maleeyah Mccaughey, [place] MPT June 21, 2015, 2:32 PM  [facility] [address] [place], [place], [address] Phone: [phone]   Fax:  [phone]

## 2015-06-01 NOTE — Therapy (Signed)
[facility] [place], [place], [address] Phone: [phone]   Fax:  [phone]  Physical Therapy Treatment  Patient Details  Name: Sandra Hamilton MRN: [id number] Date of Birth: January 27, 1936 Referring Provider:  Claretta Fraise, MD  Encounter Date: 06/01/2015      PT End of Session - 06/01/15 1133    Visit Number 12   Number of Visits 12   Date for PT Re-Evaluation 06/04/15   PT Start Time 1114   PT Stop Time 1159   PT Time Calculation (min) 45 min   Activity Tolerance Patient tolerated treatment well   Behavior During Therapy [facility] for tasks assessed/performed      Past Medical History  Diagnosis Date  . Hypertension   . Irritable bowel syndrome     Past Surgical History  Procedure Laterality Date  . Abdominal hysterectomy    . Appendectomy    . Thyroidectomy    . Breast biopsy      There were no vitals filed for this visit.  Visit Diagnosis:  Pain in joint, lower leg, unspecified laterality  Shin splint, unspecified laterality, subsequent encounter      Subjective Assessment - 06/01/15 1130    Subjective Reports that after last treatment she had some relief but it is temporary relief.   Limitations Walking   Patient Stated Goals Walk without pain.   Currently in Pain? Yes   Pain Score 8    Pain Location Leg   Pain Orientation Right;Left   Pain Descriptors / Indicators Sore;Aching   Pain Type Chronic pain   Pain Onset More than a month ago   Pain Frequency Constant   Aggravating Factors  walking up hills   Effect of Pain on Daily Activities rest                         [facility] Adult PT Treatment/Exercise - 06/01/15 0001    Electrical Stimulation   Electrical Stimulation Location Bilateral tibialis anterior area   Electrical Stimulation Action premod   Electrical Stimulation Parameters 1-10hz    Electrical Stimulation Goals Pain   Ultrasound   Ultrasound Location bil tib ant   Ultrasound Parameters 1.5w/cm2/50%/69mz x155m   Ultrasound Goals Pain   Manual Therapy   Manual Therapy Soft tissue mobilization;Myofascial release   Myofascial Release manual and IASTW to bil LE to decrease pain                  PT Short Term Goals - 05/06/15 1424    PT SHORT TERM GOAL #1   Title Ind with HEP.   Time 2   Period Weeks   Status Achieved           PT Long Term Goals - 06/01/15 1134    PT LONG TERM GOAL #1   Title Bilateral active dorsiflxion= 10 degrees.   Time 4   Period Weeks   Status Achieved  10 degrees bil   PT LONG TERM GOAL #2   Title Walk a community distance with pain not > 3/10.   Time 4   Period Weeks   Status Not Met               Plan - 06/01/15 1143    Clinical Impression Statement Patient feels 10%-20% better overall. Has met all goals except LTG #2 due to pain deficits   Pt will benefit from skilled therapeutic intervention in order to improve on the following deficits  Abnormal gait;Decreased activity tolerance;Decreased range of motion;Pain   Rehab Potential Good   PT Frequency 3x / week   PT Duration 4 weeks   PT Next Visit Plan DC per MPT   Consulted and Agree with Plan of Care Patient        Problem List Patient Active Problem List   Diagnosis Date Noted  . Hyperlipidemia with target LDL less than 100 01/05/2015  . Shin splints 01/05/2015  . Hypertension 04/03/2013   Ladean Raya, PTA 06/01/2015 12:04 PM   [place] P, PTA 06/01/2015, 12:04 PM  [facility] [address] [place], [place], [address] Phone: [phone]   Fax:  [phone]

## 2015-06-17 DIAGNOSIS — L821 Other seborrheic keratosis: Secondary | ICD-10-CM | POA: Diagnosis not present

## 2015-06-17 DIAGNOSIS — D1801 Hemangioma of skin and subcutaneous tissue: Secondary | ICD-10-CM | POA: Diagnosis not present

## 2015-06-17 DIAGNOSIS — L84 Corns and callosities: Secondary | ICD-10-CM | POA: Diagnosis not present

## 2015-06-17 DIAGNOSIS — Z85828 Personal history of other malignant neoplasm of skin: Secondary | ICD-10-CM | POA: Diagnosis not present

## 2015-07-01 ENCOUNTER — Encounter: Payer: Self-pay | Admitting: Family Medicine

## 2015-07-01 ENCOUNTER — Ambulatory Visit (INDEPENDENT_AMBULATORY_CARE_PROVIDER_SITE_OTHER): Payer: Medicare Other | Admitting: Family Medicine

## 2015-07-01 VITALS — BP 120/73 | HR 83 | Temp 97.3°F | Ht 60.0 in | Wt 100.0 lb

## 2015-07-01 DIAGNOSIS — I10 Essential (primary) hypertension: Secondary | ICD-10-CM

## 2015-07-01 DIAGNOSIS — Z23 Encounter for immunization: Secondary | ICD-10-CM | POA: Insufficient documentation

## 2015-07-01 DIAGNOSIS — Z0000 Encounter for general adult medical examination without abnormal findings: Secondary | ICD-10-CM | POA: Diagnosis not present

## 2015-07-01 MED ORDER — FLUTICASONE PROPIONATE 50 MCG/ACT NA SUSP: 2.0000 | Freq: Every day | NASAL | Status: DC

## 2015-07-01 NOTE — Patient Instructions (Signed)
Great to meet you! 

## 2015-07-01 NOTE — Addendum Note (Signed)
Addended by: Timmothy Euler on: 07/01/2015 04:49 PM   Modules accepted: Orders

## 2015-07-01 NOTE — Progress Notes (Signed)
Subjective:    Sandra Hamilton is a 79 y.o. female who presents for Medicare Annual/Subsequent preventive examination.  Preventive Screening-Counseling & Management  Tobacco History  Smoking status  . Never Smoker   Smokeless tobacco  . Never Used     Problems Prior to Visit 1. See below  Current Problems (verified) Patient Active Problem List   Diagnosis Date Noted  . Hyperlipidemia with target LDL less than 100 01/05/2015  . Shin splints 01/05/2015  . Hypertension 04/03/2013    Medications Prior to Visit Current Outpatient Prescriptions on File Prior to Visit  Medication Sig Dispense Refill  . amLODipine (NORVASC) 5 MG tablet TAKE 1 TABLET (5 MG TOTAL) BY MOUTH DAILY. 90 tablet 1  . aspirin EC 81 MG tablet Take 81 mg by mouth daily.    . calcium carbonate (OS-CAL) 600 MG TABS tablet Take 600 mg by mouth daily with breakfast.    . Elastic Bandages & Supports (V-2 HIGH COMPRESSION HOSE) MISC 1 each by Does not apply route daily. 1 each 0  . fluticasone (FLONASE) 50 MCG/ACT nasal spray PLACE 2 SPRAYS INTO BOTH NOSTRILS DAILY. 16 g 3  . meloxicam (MOBIC) 7.5 MG tablet TAKE 1 TABLET (7.5 MG TOTAL) BY MOUTH DAILY. 30 tablet 1  . Misc Natural Products (OSTEO BI-FLEX JOINT SHIELD PO) Apply 1 application topically as needed.    . Nutritional Supplements (ENSURE ACTIVE HIGH PROTEIN) LIQD Take 1 Can by mouth daily. 30 Can 5  . Omega-3 Fatty Acids (FISH OIL) 1200 MG CAPS Take 1 capsule by mouth 2 (two) times daily.     No current facility-administered medications on file prior to visit.    Current Medications (verified) Current Outpatient Prescriptions  Medication Sig Dispense Refill  . amLODipine (NORVASC) 5 MG tablet TAKE 1 TABLET (5 MG TOTAL) BY MOUTH DAILY. 90 tablet 1  . aspirin EC 81 MG tablet Take 81 mg by mouth daily.    . calcium carbonate (OS-CAL) 600 MG TABS tablet Take 600 mg by mouth daily with breakfast.    . Elastic Bandages & Supports (V-2 HIGH COMPRESSION HOSE)  MISC 1 each by Does not apply route daily. 1 each 0  . fluticasone (FLONASE) 50 MCG/ACT nasal spray PLACE 2 SPRAYS INTO BOTH NOSTRILS DAILY. 16 g 3  . meloxicam (MOBIC) 7.5 MG tablet TAKE 1 TABLET (7.5 MG TOTAL) BY MOUTH DAILY. 30 tablet 1  . Misc Natural Products (OSTEO BI-FLEX JOINT SHIELD PO) Apply 1 application topically as needed.    . Nutritional Supplements (ENSURE ACTIVE HIGH PROTEIN) LIQD Take 1 Can by mouth daily. 30 Can 5  . Omega-3 Fatty Acids (FISH OIL) 1200 MG CAPS Take 1 capsule by mouth 2 (two) times daily.     No current facility-administered medications for this visit.     Allergies (verified) Bextra; Codeine; Detrol la; Lipitor; and Prednisone   PAST HISTORY  Family History Family History  Problem Relation Age of Onset  . Heart disease Mother   . Diabetes Father     Social History Social History  Substance Use Topics  . Smoking status: Never Smoker   . Smokeless tobacco: Never Used  . Alcohol Use: No     Are there smokers in your home (other than you)? No, she does not smoke  Risk Factors Current exercise habits: The patient does not participate in regular exercise at present.  Dietary issues discussed: none   Cardiac risk factors: advanced age (older than 54 for men, 52 for women),  dyslipidemia and hypertension.  Depression Screen (Note: if answer to either of the following is "Yes", a more complete depression screening is indicated)   Over the past two weeks, have you felt down, depressed or hopeless? No  Over the past two weeks, have you felt little interest or pleasure in doing things? No  Have you lost interest or pleasure in daily life? No  Do you often feel hopeless? No  Do you cry easily over simple problems? No  Activities of Daily Living In your present state of health, do you have any difficulty performing the following activities?:  Driving? No Managing money?  No Feeding yourself? No  Getting from bed to chair? No Climbing a flight  of stairs? No Preparing food and eating?: No Bathing or showering? No Getting dressed: No Getting to the toilet? No Using the toilet:No Moving around from place to place: No In the past year have you fallen or had a near fall?:yes, broken pelvis now recovering well   Are you sexually active?  No  Do you have more than one partner?  No  Hearing Difficulties: yes Do you often ask people to speak up or repeat themselves? Yes Do you experience ringing or noises in your ears? Yes Do you have difficulty understanding soft or whispered voices? Yes   Do you feel that you have a problem with memory? No  Do you often misplace items? No  Do you feel safe at home?  Yes  Cognitive Testing  Alert? Yes  Normal Appearance?Yes  Oriented to person? Yes  Place? Yes   Time? Yes  Recall of three objects?  No  Can perform simple calculations? Yes  Displays appropriate judgment?Yes  Can read the correct time from a watch face?Yes   Advanced Directives have been discussed with the patient? Yes  List the Names of Other Physician/Practitioners you currently use: 1.    Indicate any recent Medical Services you may have received from other than [facility] providers in the past year (date may be approximate).  Immunization History  Administered Date(s) Administered  . Influenza,inj,Quad PF,36+ Mos 07/16/2013  . Pneumococcal Conjugate-13 05/05/2014    Screening Tests Health Maintenance  Topic Date Due  . PNA vac Low Risk Adult (2 of 2 - PPSV23) 05/06/2015  . INFLUENZA VACCINE  05/18/2015  . COLONOSCOPY  12/15/2017  . TETANUS/TDAP  06/17/2018  . DEXA SCAN  Completed  . ZOSTAVAX  Completed    All answers were reviewed with the patient and necessary referrals were made:  Kenn File, MD   07/01/2015   History reviewed: allergies, current medications, past family history, past medical history, past social history, past surgical history and problem list  Review of Systems Pertinent items are  noted in HPI.    Objective:      Body mass index is 19.53 kg/(m^2). BP 120/73 mmHg  Pulse 83  Temp(Src) 97.3 F (36.3 C) (Oral)  Ht 5' (1.524 m)  Wt 100 lb (45.36 kg)  BMI 19.53 kg/m2  Gen: NAD, alert, cooperative with exam HEENT: NCAT, Tms Bl WNL L with minimal cerumen CV: RRR, good S1/S2, no murmur Resp: CTABL, no wheezes, non-labored Abd: SNTND, BS present, no guarding or organomegaly Ext: No edema, warm Neuro: Alert and oriented, No gross deficits      Assessment:     79 y/o female here for her medicare annual wellnes visit. She has HTN and HLD. She was given a pneumovax today. I encouraged her to follow up with her  PCP for continued shin splints.       Plan:     During the course of the visit the patient was educated and counseled about appropriate screening and preventive services including:    Pneumococcal vaccine   packet given for advanced directives  Diet review for nutrition referral? no   Patient Instructions (the written plan) was given to the patient.  Medicare Attestation I have personally reviewed: The patient's medical and social history Their use of alcohol, tobacco or illicit drugs Their current medications and supplements The patient's functional ability including ADLs,fall risks, home safety risks, cognitive, and hearing and visual impairment Diet and physical activities Evidence for depression or mood disorders  The patient's weight, height, BMI, and visual acuity have been recorded in the chart.  I have made referrals, counseling, and provided education to the patient based on review of the above and I have provided the patient with a written personalized care plan for preventive services.     Kenn File, MD   07/01/2015

## 2015-07-27 ENCOUNTER — Other Ambulatory Visit: Payer: Self-pay | Admitting: Family Medicine

## 2015-08-04 DIAGNOSIS — H43813 Vitreous degeneration, bilateral: Secondary | ICD-10-CM | POA: Diagnosis not present

## 2015-08-04 DIAGNOSIS — H43393 Other vitreous opacities, bilateral: Secondary | ICD-10-CM | POA: Diagnosis not present

## 2015-08-04 DIAGNOSIS — H25813 Combined forms of age-related cataract, bilateral: Secondary | ICD-10-CM | POA: Diagnosis not present

## 2015-08-18 ENCOUNTER — Ambulatory Visit (INDEPENDENT_AMBULATORY_CARE_PROVIDER_SITE_OTHER): Payer: Medicare Other | Admitting: Family Medicine

## 2015-08-18 ENCOUNTER — Encounter: Payer: Self-pay | Admitting: Family Medicine

## 2015-08-18 ENCOUNTER — Ambulatory Visit (INDEPENDENT_AMBULATORY_CARE_PROVIDER_SITE_OTHER): Payer: Medicare Other

## 2015-08-18 VITALS — BP 127/73 | HR 81 | Temp 98.1°F | Ht 60.0 in | Wt 100.2 lb

## 2015-08-18 DIAGNOSIS — I150 Renovascular hypertension: Secondary | ICD-10-CM

## 2015-08-18 DIAGNOSIS — Z23 Encounter for immunization: Secondary | ICD-10-CM

## 2015-08-18 DIAGNOSIS — S86899D Other injury of other muscle(s) and tendon(s) at lower leg level, unspecified leg, subsequent encounter: Secondary | ICD-10-CM | POA: Diagnosis not present

## 2015-08-18 DIAGNOSIS — M79605 Pain in left leg: Secondary | ICD-10-CM

## 2015-08-18 DIAGNOSIS — M79604 Pain in right leg: Secondary | ICD-10-CM | POA: Diagnosis not present

## 2015-08-18 DIAGNOSIS — E785 Hyperlipidemia, unspecified: Secondary | ICD-10-CM | POA: Diagnosis not present

## 2015-08-18 MED ORDER — CELECOXIB 200 MG PO CAPS: 200.0000 mg | ORAL_CAPSULE | Freq: Every day | ORAL | Status: DC

## 2015-08-18 NOTE — Progress Notes (Signed)
Subjective:  Patient ID: Sandra Hamilton, female    DOB: 08-05-1936  Age: 79 y.o. MRN: [id number]  CC: Hypertension   HPI Sandra Hamilton presents for  follow-up of hypertension. Patient has no history of headache chest pain or shortness of breath or recent cough. Patient also denies symptoms of TIA such as numbness weakness lateralizing. Patient checks  blood pressure at home and has not had any elevated readings recently.  Anterior shin pain. Limits walking.Can't vacuum.ONset 2 years ago. Getting worse. Spaces out activities to avoid pain. Pain interfers with sleep frequently.  History Sandra Hamilton has a past medical history of Hypertension and Irritable bowel syndrome.   She has past surgical history that includes Abdominal hysterectomy; Appendectomy; Thyroidectomy; and Breast biopsy.   Her family history includes Diabetes in her father; Heart disease in her mother.She reports that she has never smoked. She has never used smokeless tobacco. She reports that she does not drink alcohol or use illicit drugs.  Outpatient Prescriptions Prior to Visit  Medication Sig Dispense Refill  . amLODipine (NORVASC) 5 MG tablet TAKE 1 TABLET (5 MG TOTAL) BY MOUTH DAILY. 90 tablet 1  . aspirin EC 81 MG tablet Take 81 mg by mouth daily.    . calcium carbonate (OS-CAL) 600 MG TABS tablet Take 600 mg by mouth daily with breakfast.    . Elastic Bandages & Supports (V-2 HIGH COMPRESSION HOSE) MISC 1 each by Does not apply route daily. 1 each 0  . fluticasone (FLONASE) 50 MCG/ACT nasal spray Place 2 sprays into both nostrils daily. 16 g 11  . Misc Natural Products (OSTEO BI-FLEX JOINT SHIELD PO) Apply 1 application topically as needed.    . Nutritional Supplements (ENSURE ACTIVE HIGH PROTEIN) LIQD Take 1 Can by mouth daily. 30 Can 5  . Omega-3 Fatty Acids (FISH OIL) 1200 MG CAPS Take 1 capsule by mouth 2 (two) times daily.    . meloxicam (MOBIC) 7.5 MG tablet TAKE 1 TABLET (7.5 MG TOTAL) BY MOUTH DAILY. 30 tablet 1    No facility-administered medications prior to visit.    ROS Review of Systems  Constitutional: Negative for fever, chills, diaphoresis, appetite change, fatigue and unexpected weight change.  HENT: Negative for congestion, ear pain, hearing loss, postnasal drip, rhinorrhea, sneezing, sore throat and trouble swallowing.   Eyes: Negative for pain.  Respiratory: Negative for cough, chest tightness and shortness of breath.   Cardiovascular: Negative for chest pain and palpitations.  Gastrointestinal: Negative for nausea, vomiting, abdominal pain, diarrhea and constipation.  Genitourinary: Negative for dysuria, frequency and menstrual problem.  Musculoskeletal: Positive for myalgias. Negative for joint swelling and arthralgias.       Pain at anterior shin is moderately severe and limits activity see history of present illness. Does not involve any joints.  Skin: Negative for rash.  Neurological: Negative for dizziness, weakness, numbness and headaches.  Psychiatric/Behavioral: Negative for dysphoric mood and agitation.    Objective:  BP 127/73 mmHg  Pulse 81  Temp(Src) 98.1 F (36.7 C) (Oral)  Ht 5' (1.524 m)  Wt 100 lb 3.2 oz (45.45 kg)  BMI 19.57 kg/m2  BP Readings from Last 3 Encounters:  08/18/15 127/73  07/01/15 120/73  04/14/15 123/74    Wt Readings from Last 3 Encounters:  08/18/15 100 lb 3.2 oz (45.45 kg)  07/01/15 100 lb (45.36 kg)  04/14/15 97 lb 12.8 oz (44.362 kg)     Physical Exam  Constitutional: She is oriented to person, place, and time. She  appears well-developed and well-nourished. No distress.  HENT:  Head: Normocephalic and atraumatic.  Right Ear: External ear normal.  Left Ear: External ear normal.  Nose: Nose normal.  Mouth/Throat: Oropharynx is clear and moist.  Eyes: Conjunctivae and EOM are normal. Pupils are equal, round, and reactive to light.  Neck: Normal range of motion. Neck supple. No thyromegaly present.  Cardiovascular: Normal rate,  regular rhythm and normal heart sounds.   No murmur heard. Pulmonary/Chest: Effort normal and breath sounds normal. No respiratory distress. She has no wheezes. She has no rales.  Abdominal: Soft. Bowel sounds are normal. She exhibits no distension. There is no tenderness.  Musculoskeletal: She exhibits tenderness (anterior shin bilaterally).  Lymphadenopathy:    She has no cervical adenopathy.  Neurological: She is alert and oriented to person, place, and time. She has normal reflexes.  Skin: Skin is warm and dry.  Psychiatric: She has a normal mood and affect. Her behavior is normal. Judgment and thought content normal.    No results found for: HGBA1C  Lab Results  Component Value Date   WBC 6.2 06/11/2013   HGB 15.3* 06/11/2013   HCT 45.0 06/11/2013   GLUCOSE 87 01/05/2015   CHOL 145 01/05/2015   TRIG 70 01/05/2015   HDL 70 01/05/2015   LDLCALC 126* 05/05/2014   ALT 14 01/05/2015   AST 21 01/05/2015   NA 143 01/05/2015   K 4.3 01/05/2015   CL 99 01/05/2015   CREATININE 0.70 01/05/2015   BUN 14 01/05/2015   CO2 26 01/05/2015    No results found.  Assessment & Plan:   Sandra Hamilton was seen today for hypertension.  Diagnoses and all orders for this visit:  Encounter for immunization  Shin splints, unspecified laterality, subsequent encounter  Renovascular hypertension  Hyperlipidemia with target LDL less than 100  Bilateral leg pain -     DG Tibia/Fibula Left -     DG Tibia/Fibula Right  Other orders -     Flu Vaccine QUAD 36+ mos IM -     celecoxib (CELEBREX) 200 MG capsule; Take 1 capsule (200 mg total) by mouth daily. With food   I have discontinued Sandra Hamilton's meloxicam. I am also having her start on celecoxib. Additionally, I am having her maintain her Fish Oil, calcium carbonate, aspirin EC, Misc Natural Products (OSTEO BI-FLEX JOINT SHIELD PO), V-2 HIGH COMPRESSION HOSE, ENSURE ACTIVE HIGH PROTEIN, amLODipine, and fluticasone.  Meds ordered this encounter    Medications  . celecoxib (CELEBREX) 200 MG capsule    Sig: Take 1 capsule (200 mg total) by mouth daily. With food    Dispense:  30 capsule    Refill:  5     Follow-up: No Follow-up on file.  Claretta Fraise, M.D.

## 2015-09-21 DIAGNOSIS — H5203 Hypermetropia, bilateral: Secondary | ICD-10-CM | POA: Diagnosis not present

## 2015-09-21 DIAGNOSIS — H25813 Combined forms of age-related cataract, bilateral: Secondary | ICD-10-CM | POA: Diagnosis not present

## 2015-09-21 DIAGNOSIS — H25811 Combined forms of age-related cataract, right eye: Secondary | ICD-10-CM | POA: Diagnosis not present

## 2015-10-01 ENCOUNTER — Other Ambulatory Visit: Payer: Self-pay | Admitting: Family Medicine

## 2015-10-05 ENCOUNTER — Other Ambulatory Visit: Payer: Self-pay | Admitting: Family Medicine

## 2015-10-20 NOTE — Patient Instructions (Signed)
Sandra Hamilton  10/20/2015     @PREFPERIOPPHARMACY @   Your procedure is scheduled on 10/26/2015.  Report to [facility] at 10:00 A.M.  Call this number if you have problems the morning of surgery:  [phone]   Remember:  Do not eat food or drink liquids after midnight.  Take these medicines the morning of surgery with A SIP OF WATER Amlodipine, Celebrex, Mobic, Flonase   Do not wear jewelry, make-up or nail polish.  Do not wear lotions, powders, or perfumes.  You may wear deodorant.  Do not shave 48 hours prior to surgery.  Men may shave face and neck.  Do not bring valuables to the hospital.  [facility] is not responsible for any belongings or valuables.  Contacts, dentures or bridgework may not be worn into surgery.  Leave your suitcase in the car.  After surgery it may be brought to your room.  For patients admitted to the hospital, discharge time will be determined by your treatment team.  Patients discharged the day of surgery will not be allowed to drive home.    Please read over the following fact sheets that you were given. Anesthesia Post-op Instructions    PATIENT INSTRUCTIONS POST-ANESTHESIA  IMMEDIATELY FOLLOWING SURGERY:  Do not drive or operate machinery for the first twenty four hours after surgery.  Do not make any important decisions for twenty four hours after surgery or while taking narcotic pain medications or sedatives.  If you develop intractable nausea and vomiting or a severe headache please notify your doctor immediately.  FOLLOW-UP:  Please make an appointment with your surgeon as instructed. You do not need to follow up with anesthesia unless specifically instructed to do so.  WOUND CARE INSTRUCTIONS (if applicable):  Keep a dry clean dressing on the anesthesia/puncture wound site if there is drainage.  Once the wound has quit draining you may leave it open to air.  Generally you should leave the bandage intact for twenty four hours unless there is  drainage.  If the epidural site drains for more than 36-48 hours please call the anesthesia department.  QUESTIONS?:  Please feel free to call your physician or the hospital operator if you have any questions, and they will be happy to assist you.       A cataract is a clouding of the lens of the eye. When a lens becomes cloudy, vision is reduced based on the degree and nature of the clouding. Surgery may be needed to improve vision. Surgery removes the cloudy lens and usually replaces it with a substitute lens (intraocular lens, IOL). LET YOUR EYE DOCTOR KNOW ABOUT:  Allergies to food or medicine.  Medicines taken including herbs, eye drops, over-the-counter medicines, and creams.  Use of steroids (by mouth or creams).  Previous problems with anesthetics or numbing medicine.  History of bleeding problems or blood clots.  Previous surgery.  Other health problems, including diabetes and kidney problems.  Possibility of pregnancy, if this applies. RISKS AND COMPLICATIONS  Infection.  Inflammation of the eyeball (endophthalmitis) that can spread to both eyes (sympathetic ophthalmia).  Poor wound healing.  If an IOL is inserted, it can later fall out of proper position. This is very uncommon.  Clouding of the part of your eye that holds an IOL in place. This is called an "after-cataract." These are uncommon but easily treated. BEFORE THE PROCEDURE  Do not eat or drink anything except small amounts of water for 8 to 12 before your  surgery, or as directed by your caregiver.  Unless you are told otherwise, continue any eye drops you have been prescribed.  Talk to your primary caregiver about all other medicines that you take (both prescription and nonprescription). In some cases, you may need to stop or change medicines near the time of your surgery. This is most important if you are taking blood-thinning medicine.Do not stop medicines unless you are told to do so.  Arrange for  someone to drive you to and from the procedure.  Do not put contact lenses in either eye on the day of your surgery. PROCEDURE There is more than one method for safely removing a cataract. Your doctor can explain the differences and help determine which is best for you. Phacoemulsification surgery is the most common form of cataract surgery.  An injection is given behind the eye or eye drops are given to make this a painless procedure.  A small cut (incision) is made on the edge of the clear, dome-shaped surface that covers the front of the eye (cornea).  A tiny probe is painlessly inserted into the eye. This device gives off ultrasound waves that soften and break up the cloudy center of the lens. This makes it easier for the cloudy lens to be removed by suction.  An IOL may be implanted.  The normal lens of the eye is covered by a clear capsule. Part of that capsule is intentionally left in the eye to support the IOL.  Your surgeon may or may not use stitches to close the incision. There are other forms of cataract surgery that require a larger incision and stitches to close the eye. This approach is taken in cases where the doctor feels that the cataract cannot be easily removed using phacoemulsification. AFTER THE PROCEDURE  When an IOL is implanted, it does not need care. It becomes a permanent part of your eye and cannot be seen or felt.  Your doctor will schedule follow-up exams to check on your progress.  Review your other medicines with your doctor to see which can be resumed after surgery.  Use eye drops or take medicine as prescribed by your doctor.   This information is not intended to replace advice given to you by your health care provider. Make sure you discuss any questions you have with your health care provider.   Document Released: 09/22/2011 Document Revised: 10/24/2014 Document Reviewed: 09/22/2011 Elsevier Interactive Patient Education [employer].

## 2015-10-21 ENCOUNTER — Encounter (HOSPITAL_COMMUNITY)
Admission: RE | Admit: 2015-10-21 | Discharge: 2015-10-21 | Disposition: A | Discharge status: Home / Self Care | Payer: Medicare Other | Source: Ambulatory Visit | Attending: Ophthalmology | Admitting: Ophthalmology

## 2015-10-21 ENCOUNTER — Other Ambulatory Visit: Payer: Self-pay

## 2015-10-21 ENCOUNTER — Encounter (HOSPITAL_COMMUNITY): Payer: Self-pay

## 2015-10-21 DIAGNOSIS — Z01818 Encounter for other preprocedural examination: Secondary | ICD-10-CM | POA: Diagnosis not present

## 2015-10-21 HISTORY — DX: Unspecified osteoarthritis, unspecified site: M19.90

## 2015-10-21 LAB — BASIC METABOLIC PANEL
Anion gap: 9 (ref 5–15)
BUN: 19 mg/dL (ref 6–20)
CHLORIDE: 103 mmol/L (ref 101–111)
CO2: 29 mmol/L (ref 22–32)
CREATININE: 0.63 mg/dL (ref 0.44–1.00)
Calcium: 9.8 mg/dL (ref 8.9–10.3)
GFR calc Af Amer: >60 mL/min (ref >60)
GFR calc non Af Amer: >60 mL/min (ref >60)
GLUCOSE: 106 mg/dL — AB (ref 65–99)
POTASSIUM: 3.9 mmol/L (ref 3.5–5.1)
Sodium: 141 mmol/L (ref 135–145)

## 2015-10-21 LAB — CBC
HEMATOCRIT: 41 % (ref 36.0–46.0)
HEMOGLOBIN: 13.4 g/dL (ref 12.0–15.0)
MCH: 28.8 pg (ref 26.0–34.0)
MCHC: 32.7 g/dL (ref 30.0–36.0)
MCV: 88 fL (ref 78.0–100.0)
Platelets: 201 10*3/uL (ref 150–400)
RBC: 4.66 MIL/uL (ref 3.87–5.11)
RDW: 13.1 % (ref 11.5–15.5)
WBC: 5.2 10*3/uL (ref 4.0–10.5)

## 2015-10-21 NOTE — Pre-Procedure Instructions (Signed)
Patient given information to sign up for my chart at home. 

## 2015-10-23 MED ORDER — PHENYLEPHRINE HCL 2.5 % OP SOLN
OPHTHALMIC | Status: AC
  Filled 2015-10-23: qty 15

## 2015-10-23 MED ORDER — LIDOCAINE HCL (PF) 1 % IJ SOLN
INTRAMUSCULAR | Status: AC
  Filled 2015-10-23: qty 2

## 2015-10-23 MED ORDER — LIDOCAINE HCL 3.5 % OP GEL
OPHTHALMIC | Status: AC
  Filled 2015-10-23: qty 1

## 2015-10-23 MED ORDER — TETRACAINE HCL 0.5 % OP SOLN
OPHTHALMIC | Status: AC
Start: 2015-10-23 — End: 2015-10-23
  Filled 2015-10-23: qty 4

## 2015-10-23 MED ORDER — CYCLOPENTOLATE-PHENYLEPHRINE OP SOLN OPTIME - NO CHARGE
OPHTHALMIC | Status: AC
  Filled 2015-10-23: qty 2

## 2015-10-23 MED ORDER — NEOMYCIN-POLYMYXIN-DEXAMETH 3.5-10000-0.1 OP SUSP
OPHTHALMIC | Status: AC
  Filled 2015-10-23: qty 5

## 2015-10-26 ENCOUNTER — Ambulatory Visit (HOSPITAL_COMMUNITY): Admission: RE | Admit: 2015-10-26 | Payer: Medicare Other | Source: Ambulatory Visit | Admitting: Ophthalmology

## 2015-10-26 ENCOUNTER — Encounter (HOSPITAL_COMMUNITY): Admission: RE | Payer: Self-pay | Source: Ambulatory Visit

## 2015-10-26 SURGERY — PHACOEMULSIFICATION, CATARACT, WITH IOL INSERTION
Anesthesia: Monitor Anesthesia Care | Site: Eye | Laterality: Right

## 2015-10-27 ENCOUNTER — Ambulatory Visit: Payer: Medicare Other | Admitting: Family Medicine

## 2015-10-28 ENCOUNTER — Other Ambulatory Visit: Payer: Self-pay | Admitting: *Deleted

## 2015-10-28 ENCOUNTER — Ambulatory Visit (INDEPENDENT_AMBULATORY_CARE_PROVIDER_SITE_OTHER): Payer: Medicare Other | Admitting: Family Medicine

## 2015-10-28 ENCOUNTER — Telehealth: Payer: Self-pay | Admitting: Family Medicine

## 2015-10-28 ENCOUNTER — Encounter: Payer: Self-pay | Admitting: Family Medicine

## 2015-10-28 VITALS — BP 130/73 | HR 99 | Temp 97.6°F | Ht 61.0 in | Wt 100.2 lb

## 2015-10-28 DIAGNOSIS — J0110 Acute frontal sinusitis, unspecified: Secondary | ICD-10-CM | POA: Diagnosis not present

## 2015-10-28 MED ORDER — LEVOFLOXACIN 250 MG PO TABS: 250.0000 mg | ORAL_TABLET | Freq: Every day | ORAL | Status: DC

## 2015-10-28 MED ORDER — PSEUDOEPHEDRINE-GUAIFENESIN ER 120-1200 MG PO TB12: 1.0000 | ORAL_TABLET | Freq: Two times a day (BID) | ORAL | Status: DC

## 2015-10-28 NOTE — Progress Notes (Signed)
Subjective:  Patient ID: Sandra Hamilton, female    DOB: [birth year]  Age: 80 y.o. MRN: [id number]  CC: URI   HPI Sandra Hamilton presents for Patient presents with upper respiratory congestion. Rhinorrhea that is occasionally purulent. There is frontal HA and scratchy sore throat. Patient reports coughing frequently ano sputum noted. There is no fever no chills no sweats. The patient denies being short of breath. Onset was 2 weeks ago. Gradually worsening.  History Natally has a past medical history of Hypertension; Irritable bowel syndrome; and Arthritis.   She has past surgical history that includes Abdominal hysterectomy; Appendectomy; Thyroidectomy; and Breast biopsy (Left).   Her family history includes Diabetes in her father; Heart disease in her mother.She reports that she has never smoked. She has never used smokeless tobacco. She reports that she does not drink alcohol or use illicit drugs.  Outpatient Prescriptions Prior to Visit  Medication Sig Dispense Refill  . amLODipine (NORVASC) 5 MG tablet TAKE 1 TABLET (5 MG TOTAL) BY MOUTH DAILY. 90 tablet 1  . aspirin EC 81 MG tablet Take 81 mg by mouth daily.    . calcium carbonate (OS-CAL) 600 MG TABS tablet Take 600 mg by mouth daily with breakfast.    . celecoxib (CELEBREX) 200 MG capsule Take 1 capsule (200 mg total) by mouth daily. With food 30 capsule 5  . Elastic Bandages & Supports (V-2 HIGH COMPRESSION HOSE) MISC 1 each by Does not apply route daily. 1 each 0  . fluticasone (FLONASE) 50 MCG/ACT nasal spray Place 2 sprays into both nostrils daily. 16 g 11  . Misc Natural Products (OSTEO BI-FLEX JOINT SHIELD PO) Apply 1 application topically as needed.    . Nutritional Supplements (ENSURE ACTIVE HIGH PROTEIN) LIQD Take 1 Can by mouth daily. 30 Can 5  . Omega-3 Fatty Acids (FISH OIL) 1200 MG CAPS Take 1 capsule by mouth 2 (two) times daily.    . meloxicam (MOBIC) 7.5 MG tablet TAKE 1 TABLET (7.5 MG TOTAL) BY MOUTH DAILY. (Patient not  taking: Reported on 10/28/2015) 30 tablet 1   No facility-administered medications prior to visit.    ROS Review of Systems  Constitutional: Negative for fever, chills, activity change and appetite change.  HENT: Positive for congestion, postnasal drip, rhinorrhea and sinus pressure. Negative for ear discharge, ear pain, hearing loss, nosebleeds, sneezing and trouble swallowing.   Respiratory: Positive for cough. Negative for chest tightness and shortness of breath.   Cardiovascular: Negative for chest pain and palpitations.  Skin: Negative for rash.    Objective:  BP 130/73 mmHg  Pulse 99  Temp(Src) 97.6 F (36.4 C) (Oral)  Ht 5\' 1"  (1.549 m)  Wt 100 lb 3.2 oz (45.45 kg)  BMI 18.94 kg/m2  SpO2 98%  BP Readings from Last 3 Encounters:  10/28/15 130/73  10/21/15 129/68  08/18/15 127/73    Wt Readings from Last 3 Encounters:  10/28/15 100 lb 3.2 oz (45.45 kg)  10/21/15 135 lb (61.236 kg)  08/18/15 100 lb 3.2 oz (45.45 kg)     Physical Exam  Constitutional: She appears well-developed and well-nourished.  HENT:  Head: Normocephalic and atraumatic.  Right Ear: Tympanic membrane and external ear normal. No decreased hearing is noted.  Left Ear: Tympanic membrane and external ear normal. No decreased hearing is noted.  Nose: Mucosal edema (and erythema) and rhinorrhea present. No sinus tenderness. No epistaxis. Right sinus exhibits frontal sinus tenderness. Left sinus exhibits frontal sinus tenderness.  Mouth/Throat: No oropharyngeal  exudate or posterior oropharyngeal erythema.  Eyes: Conjunctivae are normal. Pupils are equal, round, and reactive to light. Right eye exhibits no discharge.  Neck: No Brudzinski's sign noted.  Pulmonary/Chest: Effort normal and breath sounds normal. No respiratory distress.  Lymphadenopathy:       Head (right side): No preauricular adenopathy present.       Head (left side): No preauricular adenopathy present.       Right cervical: No  superficial cervical adenopathy present.      Left cervical: No superficial cervical adenopathy present.     Lab Results  Component Value Date   WBC 5.2 10/21/2015   HGB 13.4 10/21/2015   HCT 41.0 10/21/2015   PLT 201 10/21/2015   GLUCOSE 106* 10/21/2015   CHOL 145 01/05/2015   TRIG 70 01/05/2015   HDL 70 01/05/2015   LDLCALC 126* 05/05/2014   ALT 14 01/05/2015   AST 21 01/05/2015   NA 141 10/21/2015   K 3.9 10/21/2015   CL 103 10/21/2015   CREATININE 0.63 10/21/2015   BUN 19 10/21/2015   CO2 29 10/21/2015    No results found.  Assessment & Plan:   Sandra was seen today for uri.  Diagnoses and all orders for this visit:  Acute frontal sinusitis, recurrence not specified  Other orders -     levofloxacin (LEVAQUIN) 250 MG tablet; Take 1 tablet (250 mg total) by mouth daily. -     Pseudoephedrine-Guaifenesin [phone] MG TB12; Take 1 tablet by mouth 2 (two) times daily. For congestion   I am having Sandra Hamilton start on levofloxacin and Pseudoephedrine-Guaifenesin. I am also having her maintain her Fish Oil, calcium carbonate, aspirin EC, Misc Natural Products (OSTEO BI-FLEX JOINT SHIELD PO), V-2 HIGH COMPRESSION HOSE, ENSURE ACTIVE HIGH PROTEIN, fluticasone, celecoxib, meloxicam, amLODipine, PROLENSA, and DUREZOL.  Meds ordered this encounter  Medications  . PROLENSA 0.07 % SOLN    Sig:   . DUREZOL 0.05 % EMUL    Sig:   . levofloxacin (LEVAQUIN) 250 MG tablet    Sig: Take 1 tablet (250 mg total) by mouth daily.    Dispense:  10 tablet    Refill:  0  . Pseudoephedrine-Guaifenesin [phone] MG TB12    Sig: Take 1 tablet by mouth 2 (two) times daily. For congestion    Dispense:  20 each    Refill:  0     Follow-up: Return in about 2 months (around 12/26/2015) for hypertension, check up.  Claretta Fraise, M.D.

## 2015-10-28 NOTE — Progress Notes (Signed)
Per [employer] pt can not get RXs at [facility] RXs sent into [facility]

## 2015-10-28 NOTE — Telephone Encounter (Signed)
Pt notified RXs sent into [facility] verbalizes understanding.

## 2015-11-09 ENCOUNTER — Encounter (HOSPITAL_COMMUNITY)
Admission: RE | Admit: 2015-11-09 | Discharge: 2015-11-09 | Disposition: A | Discharge status: Home / Self Care | Payer: Medicare Other | Source: Ambulatory Visit | Attending: Ophthalmology | Admitting: Ophthalmology

## 2015-11-09 NOTE — Patient Instructions (Signed)
    PATTIE STINE  11/09/2015     @PREFPERIOPPHARMACY @   Your procedure is scheduled on 11/12/2015.  Report to [facility] at 10:30 A.M.  Call this number if you have problems the morning of surgery:  [phone]   Remember:  Do not eat food or drink liquids after midnight.  Take these medicines the morning of surgery with A SIP OF WATER Amlodipine, Mobic and Flonase   Do not wear jewelry, make-up or nail polish.  Do not wear lotions, powders, or perfumes.  You may wear deodorant.  Do not shave 48 hours prior to surgery.  Men may shave face and neck.  Do not bring valuables to the hospital.  [facility] is not responsible for any belongings or valuables.  Contacts, dentures or bridgework may not be worn into surgery.  Leave your suitcase in the car.  After surgery it may be brought to your room.  For patients admitted to the hospital, discharge time will be determined by your treatment team.  Patients discharged the day of surgery will not be allowed to drive home.   Name and phone number of your driver:   family Special instructions:  n/a  Please read over the following fact sheets that you were given. Care and Recovery After Surgery

## 2015-11-11 MED ORDER — PHENYLEPHRINE HCL 2.5 % OP SOLN
OPHTHALMIC | Status: AC
  Filled 2015-11-11: qty 15

## 2015-11-11 MED ORDER — LIDOCAINE HCL (PF) 1 % IJ SOLN
INTRAMUSCULAR | Status: AC
  Filled 2015-11-11: qty 2

## 2015-11-11 MED ORDER — CYCLOPENTOLATE-PHENYLEPHRINE OP SOLN OPTIME - NO CHARGE
OPHTHALMIC | Status: AC
  Filled 2015-11-11: qty 2

## 2015-11-11 MED ORDER — TETRACAINE HCL 0.5 % OP SOLN
OPHTHALMIC | Status: AC
  Filled 2015-11-11: qty 4

## 2015-11-11 MED ORDER — NEOMYCIN-POLYMYXIN-DEXAMETH 3.5-10000-0.1 OP SUSP
OPHTHALMIC | Status: AC
  Filled 2015-11-11: qty 5

## 2015-11-11 MED ORDER — LIDOCAINE HCL 3.5 % OP GEL
OPHTHALMIC | Status: AC
  Filled 2015-11-11: qty 1

## 2015-11-12 ENCOUNTER — Encounter (HOSPITAL_COMMUNITY)
Admission: RE | Disposition: A | Discharge status: Home / Self Care | Payer: Self-pay | Source: Ambulatory Visit | Attending: Ophthalmology

## 2015-11-12 ENCOUNTER — Ambulatory Visit (HOSPITAL_COMMUNITY): Payer: Medicare Other | Admitting: Anesthesiology

## 2015-11-12 ENCOUNTER — Encounter (HOSPITAL_COMMUNITY): Payer: Self-pay | Admitting: Ophthalmology

## 2015-11-12 ENCOUNTER — Ambulatory Visit (HOSPITAL_COMMUNITY)
Admission: RE | Admit: 2015-11-12 | Discharge: 2015-11-12 | Disposition: A | Discharge status: Home / Self Care | Payer: Medicare Other | Source: Ambulatory Visit | Attending: Ophthalmology | Admitting: Ophthalmology

## 2015-11-12 DIAGNOSIS — Z885 Allergy status to narcotic agent status: Secondary | ICD-10-CM | POA: Insufficient documentation

## 2015-11-12 DIAGNOSIS — J302 Other seasonal allergic rhinitis: Secondary | ICD-10-CM | POA: Insufficient documentation

## 2015-11-12 DIAGNOSIS — H25811 Combined forms of age-related cataract, right eye: Secondary | ICD-10-CM | POA: Diagnosis present

## 2015-11-12 DIAGNOSIS — H25812 Combined forms of age-related cataract, left eye: Secondary | ICD-10-CM | POA: Diagnosis not present

## 2015-11-12 DIAGNOSIS — Z888 Allergy status to other drugs, medicaments and biological substances status: Secondary | ICD-10-CM | POA: Diagnosis not present

## 2015-11-12 DIAGNOSIS — Z79899 Other long term (current) drug therapy: Secondary | ICD-10-CM | POA: Insufficient documentation

## 2015-11-12 DIAGNOSIS — H2511 Age-related nuclear cataract, right eye: Secondary | ICD-10-CM | POA: Diagnosis not present

## 2015-11-12 DIAGNOSIS — I10 Essential (primary) hypertension: Secondary | ICD-10-CM | POA: Diagnosis not present

## 2015-11-12 DIAGNOSIS — Z882 Allergy status to sulfonamides status: Secondary | ICD-10-CM | POA: Diagnosis not present

## 2015-11-12 DIAGNOSIS — Z90710 Acquired absence of both cervix and uterus: Secondary | ICD-10-CM | POA: Insufficient documentation

## 2015-11-12 DIAGNOSIS — M1990 Unspecified osteoarthritis, unspecified site: Secondary | ICD-10-CM | POA: Insufficient documentation

## 2015-11-12 DIAGNOSIS — H43813 Vitreous degeneration, bilateral: Secondary | ICD-10-CM | POA: Diagnosis not present

## 2015-11-12 DIAGNOSIS — H259 Unspecified age-related cataract: Secondary | ICD-10-CM | POA: Diagnosis not present

## 2015-11-12 DIAGNOSIS — H43393 Other vitreous opacities, bilateral: Secondary | ICD-10-CM | POA: Diagnosis not present

## 2015-11-12 HISTORY — PX: CATARACT EXTRACTION W/PHACO: SHX586

## 2015-11-12 SURGERY — PHACOEMULSIFICATION, CATARACT, WITH IOL INSERTION
Anesthesia: Monitor Anesthesia Care | Site: Eye | Laterality: Right

## 2015-11-12 MED ORDER — BSS IO SOLN
INTRAOCULAR | Status: DC | PRN
  Administered 2015-11-12: 15 mL

## 2015-11-12 MED ORDER — LACTATED RINGERS IV SOLN
INTRAVENOUS | Status: DC
  Administered 2015-11-12: 11:00:00 via INTRAVENOUS

## 2015-11-12 MED ORDER — CYCLOPENTOLATE-PHENYLEPHRINE 0.2-1 % OP SOLN
1.0000 [drp] | OPHTHALMIC | Status: AC
  Administered 2015-11-12 (×3): 1 [drp] via OPHTHALMIC

## 2015-11-12 MED ORDER — LIDOCAINE HCL 3.5 % OP GEL
1.0000 "application " | Freq: Once | OPHTHALMIC | Status: AC
  Administered 2015-11-12: 1 via OPHTHALMIC

## 2015-11-12 MED ORDER — NEOMYCIN-POLYMYXIN-DEXAMETH 3.5-10000-0.1 OP SUSP
OPHTHALMIC | Status: DC | PRN
  Administered 2015-11-12: 2 [drp] via OPHTHALMIC

## 2015-11-12 MED ORDER — TETRACAINE HCL 0.5 % OP SOLN
1.0000 [drp] | OPHTHALMIC | Status: AC
  Administered 2015-11-12 (×3): 1 [drp] via OPHTHALMIC

## 2015-11-12 MED ORDER — POVIDONE-IODINE 5 % OP SOLN
OPHTHALMIC | Status: DC | PRN
  Administered 2015-11-12: 1 via OPHTHALMIC

## 2015-11-12 MED ORDER — LIDOCAINE HCL (PF) 1 % IJ SOLN
INTRAMUSCULAR | Status: DC | PRN
  Administered 2015-11-12: .6 mL

## 2015-11-12 MED ORDER — PROVISC 10 MG/ML IO SOLN
INTRAOCULAR | Status: DC | PRN
  Administered 2015-11-12: 0.85 mL via INTRAOCULAR

## 2015-11-12 MED ORDER — PHENYLEPHRINE HCL 2.5 % OP SOLN
1.0000 [drp] | OPHTHALMIC | Status: AC
  Administered 2015-11-12 (×3): 1 [drp] via OPHTHALMIC

## 2015-11-12 MED ORDER — EPINEPHRINE HCL 1 MG/ML IJ SOLN
INTRAMUSCULAR | Status: DC | PRN
  Administered 2015-11-12: 500 mL

## 2015-11-12 MED ORDER — MIDAZOLAM HCL 2 MG/2ML IJ SOLN
1.0000 mg | INTRAMUSCULAR | Status: DC | PRN
  Administered 2015-11-12: 2 mg via INTRAVENOUS

## 2015-11-12 MED ORDER — MIDAZOLAM HCL 2 MG/2ML IJ SOLN
INTRAMUSCULAR | Status: AC
  Filled 2015-11-12: qty 2

## 2015-11-12 SURGICAL SUPPLY — 34 items
CAPSULAR TENSION RING-AMO (OPHTHALMIC RELATED) IMPLANT
CLOTH BEACON ORANGE TIMEOUT ST (SAFETY) ×3 IMPLANT
EYE SHIELD UNIVERSAL CLEAR (GAUZE/BANDAGES/DRESSINGS) ×3 IMPLANT
GLOVE BIO SURGEON STRL SZ 6.5 (GLOVE) ×2 IMPLANT
GLOVE BIO SURGEONS STRL SZ 6.5 (GLOVE) ×1
GLOVE BIOGEL PI IND STRL 6.5 (GLOVE) ×1 IMPLANT
GLOVE BIOGEL PI IND STRL 7.0 (GLOVE) IMPLANT
GLOVE BIOGEL PI IND STRL 7.5 (GLOVE) IMPLANT
GLOVE BIOGEL PI INDICATOR 6.5 (GLOVE) ×2
GLOVE BIOGEL PI INDICATOR 7.0 (GLOVE)
GLOVE BIOGEL PI INDICATOR 7.5 (GLOVE)
GLOVE ECLIPSE 6.5 STRL STRAW (GLOVE) IMPLANT
GLOVE ECLIPSE 7.0 STRL STRAW (GLOVE) IMPLANT
GLOVE ECLIPSE 7.5 STRL STRAW (GLOVE) IMPLANT
GLOVE EXAM NITRILE LRG STRL (GLOVE) IMPLANT
GLOVE EXAM NITRILE MD LF STRL (GLOVE) IMPLANT
GLOVE SKINSENSE NS SZ6.5 (GLOVE)
GLOVE SKINSENSE NS SZ7.0 (GLOVE)
GLOVE SKINSENSE STRL SZ6.5 (GLOVE) IMPLANT
GLOVE SKINSENSE STRL SZ7.0 (GLOVE) IMPLANT
KIT VITRECTOMY (OPHTHALMIC RELATED) IMPLANT
PAD ARMBOARD 7.5X6 YLW CONV (MISCELLANEOUS) ×3 IMPLANT
PROC W NO LENS (INTRAOCULAR LENS)
PROC W SPEC LENS (INTRAOCULAR LENS)
PROCESS W NO LENS (INTRAOCULAR LENS) IMPLANT
PROCESS W SPEC LENS (INTRAOCULAR LENS) IMPLANT
RETRACTOR IRIS SIGHTPATH (OPHTHALMIC RELATED) IMPLANT
RING MALYGIN (MISCELLANEOUS) IMPLANT
SIGHTPATH CAT PROC W REG LENS (Ophthalmic Related) ×3 IMPLANT
SYRINGE LUER LOK 1CC (MISCELLANEOUS) ×3 IMPLANT
TAPE SURG TRANSPORE 1 IN (GAUZE/BANDAGES/DRESSINGS) ×1 IMPLANT
TAPE SURGICAL TRANSPORE 1 IN (GAUZE/BANDAGES/DRESSINGS) ×2
VISCOELASTIC ADDITIONAL (OPHTHALMIC RELATED) IMPLANT
WATER STERILE IRR 250ML POUR (IV SOLUTION) ×3 IMPLANT

## 2015-11-12 NOTE — Anesthesia Postprocedure Evaluation (Signed)
Anesthesia Post Note  Patient: Sandra Hamilton  Procedure(s) Performed: Procedure(s) (LRB): CATARACT EXTRACTION PHACO AND INTRAOCULAR LENS PLACEMENT RIGHT EYE (Right)  Patient location during evaluation: Short Stay Anesthesia Type: MAC Level of consciousness: awake and alert Pain management: pain level controlled Vital Signs Assessment: post-procedure vital signs reviewed and stable Respiratory status: spontaneous breathing Cardiovascular status: blood pressure returned to baseline Postop Assessment: no signs of nausea or vomiting Anesthetic complications: no    Last Vitals:  Filed Vitals:   11/12/15 1140 11/12/15 1145  BP: 120/74 120/74  Pulse:    Temp:    Resp: 15 16    Last Pain: There were no vitals filed for this visit.               Debra Calabretta

## 2015-11-12 NOTE — Anesthesia Preprocedure Evaluation (Signed)
Anesthesia Evaluation  Patient identified by MRN, date of birth, ID band Patient awake    Reviewed: Allergy & Precautions, NPO status , Patient's Chart, lab work & pertinent test results  Airway Mallampati: I  TM Distance: >3 FB Neck ROM: Full    Dental  (+) Teeth Intact, Caps   Pulmonary    Pulmonary exam normal        Cardiovascular hypertension, Pt. on medications Normal cardiovascular exam     Neuro/Psych    GI/Hepatic   Endo/Other    Renal/GU      Musculoskeletal  (+) Arthritis , Osteoarthritis,    Abdominal Normal abdominal exam  (+)   Peds  Hematology   Anesthesia Other Findings   Reproductive/Obstetrics                             Anesthesia Physical Anesthesia Plan  ASA: II  Anesthesia Plan: MAC   Post-op Pain Management:    Induction: Intravenous  Airway Management Planned: Nasal Cannula  Additional Equipment:   Intra-op Plan:   Post-operative Plan:   Informed Consent: I have reviewed the patients History and Physical, chart, labs and discussed the procedure including the risks, benefits and alternatives for the proposed anesthesia with the patient or authorized representative who has indicated his/her understanding and acceptance.   Dental advisory given  Plan Discussed with: CRNA  Anesthesia Plan Comments:         Anesthesia Quick Evaluation

## 2015-11-12 NOTE — Transfer of Care (Signed)
Immediate Anesthesia Transfer of Care Note  Patient: Sandra Hamilton  Procedure(s) Performed: Procedure(s) with comments: CATARACT EXTRACTION PHACO AND INTRAOCULAR LENS PLACEMENT RIGHT EYE (Right) - CDE 7.40  Patient Location: Short Stay  Anesthesia Type:MAC  Level of Consciousness: awake  Airway & Oxygen Therapy: Patient Spontanous Breathing  Post-op Assessment: Report given to RN  Post vital signs: Reviewed  Last Vitals:  Filed Vitals:   11/12/15 1140 11/12/15 1145  BP: 120/74 120/74  Pulse:    Temp:    Resp: 15 16    Complications: No apparent anesthesia complications

## 2015-11-12 NOTE — Discharge Instructions (Signed)
PATIENT INSTRUCTIONS °POST-ANESTHESIA ° °IMMEDIATELY FOLLOWING SURGERY:  Do not drive or operate machinery for the first twenty four hours after surgery.  Do not make any important decisions for twenty four hours after surgery or while taking narcotic pain medications or sedatives.  If you develop intractable nausea and vomiting or a severe headache please notify your doctor immediately. ° °FOLLOW-UP:  Please make an appointment with your surgeon as instructed. You do not need to follow up with anesthesia unless specifically instructed to do so. ° °WOUND CARE INSTRUCTIONS (if applicable):  Keep a dry clean dressing on the anesthesia/puncture wound site if there is drainage.  Once the wound has quit draining you may leave it open to air.  Generally you should leave the bandage intact for twenty four hours unless there is drainage.  If the epidural site drains for more than 36-48 hours please call the anesthesia department. ° °QUESTIONS?:  Please feel free to call your physician or the hospital operator if you have any questions, and they will be happy to assist you.    ° °Monitored Anesthesia Care °Monitored anesthesia care is an anesthesia service for a medical procedure. Anesthesia is the loss of the ability to feel pain. It is produced by medicines called anesthetics. It may affect a small area of your body (local anesthesia), a large area of your body (regional anesthesia), or your entire body (general anesthesia). The need for monitored anesthesia care depends your procedure, your condition, and the potential need for regional or general anesthesia. It is often provided during procedures where:  °· General anesthesia may be needed if there are complications. This is because you need special care when you are under general anesthesia.   °· You will be under local or regional anesthesia. This is so that you are able to have higher levels of anesthesia if needed.   °· You will receive calming medicines (sedatives).  This is especially the case if sedatives are given to put you in a semi-conscious state of relaxation (deep sedation). This is because the amount of sedative needed to produce this state can be hard to predict. Too much of a sedative can produce general anesthesia. °Monitored anesthesia care is performed by one or more health care providers who have special training in all types of anesthesia. You will need to meet with these health care providers before your procedure. During this meeting, they will ask you about your medical history. They will also give you instructions to follow. (For example, you will need to stop eating and drinking before your procedure. You may also need to stop or change medicines you are taking.) During your procedure, your health care providers will stay with you. They will:  °· Watch your condition. This includes watching your blood pressure, breathing, and level of pain.   °· Diagnose and treat problems that occur.   °· Give medicines if they are needed. These may include calming medicines (sedatives) and anesthetics.   °· Make sure you are comfortable.   °Having monitored anesthesia care does not necessarily mean that you will be under anesthesia. It does mean that your health care providers will be able to manage anesthesia if you need it or if it occurs. It also means that you will be able to have a different type of anesthesia than you are having if you need it. When your procedure is complete, your health care providers will continue to watch your condition. They will make sure any medicines wear off before you are allowed to go home.  °  °  This information is not intended to replace advice given to you by your health care provider. Make sure you discuss any questions you have with your health care provider.

## 2015-11-12 NOTE — Op Note (Signed)
Date of Admission: 11/12/2015  Date of Surgery: 11/12/2015   Pre-Op Dx: Cataract Right Eye  Post-Op Dx: Senile Combined Cataract Right  Eye,  Dx Code [id number]  Surgeon: Tonny Branch, M.D.  Assistants: None  Anesthesia: Topical with MAC  Indications: Painless, progressive loss of vision with compromise of daily activities.  Surgery: Cataract Extraction with Intraocular lens Implant Right Eye  Discription: The patient had dilating drops and viscous lidocaine placed into the Right eye in the pre-op holding area. After transfer to the operating room, a time out was performed. The patient was then prepped and draped. Beginning with a 4 degree blade a paracentesis port was made at the surgeon's 2 o'clock position. The anterior chamber was then filled with 1% non-preserved lidocaine. This was followed by filling the anterior chamber with Provisc.  A 2.35mm keratome blade was used to make a clear corneal incision at the temporal limbus.  A bent cystatome needle was used to create a continuous tear capsulotomy. Hydrodissection was performed with balanced salt solution on a Fine canula. The lens nucleus was then removed using the phacoemulsification handpiece. Residual cortex was removed with the I&A handpiece. The anterior chamber and capsular bag were refilled with Provisc. A posterior chamber intraocular lens was placed into the capsular bag with it's injector. The implant was positioned with the Kuglan hook. The Provisc was then removed from the anterior chamber and capsular bag with the I&A handpiece. Stromal hydration of the main incision and paracentesis port was performed with BSS on a Fine canula. The wounds were tested for leak which was negative. The patient tolerated the procedure well. There were no operative complications. The patient was then transferred to the recovery room in stable condition.  Complications: None  Specimen: None  EBL: None  Prosthetic device: Hoya iSert 250, power 23.5  D, SN [id number].

## 2015-11-12 NOTE — H&P (Signed)
I have reviewed the H&P, the patient was re-examined, and I have identified no interval changes in medical condition and plan of care since the history and physical of record  

## 2015-11-13 ENCOUNTER — Encounter (HOSPITAL_COMMUNITY): Payer: Self-pay | Admitting: Ophthalmology

## 2015-12-10 ENCOUNTER — Encounter: Payer: Self-pay | Admitting: Family Medicine

## 2015-12-10 ENCOUNTER — Ambulatory Visit (INDEPENDENT_AMBULATORY_CARE_PROVIDER_SITE_OTHER): Payer: Medicare Other | Admitting: Family Medicine

## 2015-12-10 VITALS — BP 121/72 | HR 84 | Temp 97.3°F | Ht 61.0 in | Wt 97.2 lb

## 2015-12-10 DIAGNOSIS — J340 Abscess, furuncle and carbuncle of nose: Secondary | ICD-10-CM

## 2015-12-10 DIAGNOSIS — I150 Renovascular hypertension: Secondary | ICD-10-CM

## 2015-12-10 DIAGNOSIS — S86892S Other injury of other muscle(s) and tendon(s) at lower leg level, left leg, sequela: Secondary | ICD-10-CM | POA: Diagnosis not present

## 2015-12-10 MED ORDER — MELOXICAM 15 MG PO TABS: 15.0000 mg | ORAL_TABLET | Freq: Every day | ORAL | Status: DC

## 2015-12-10 MED ORDER — MINOCYCLINE HCL 100 MG PO CAPS: 100.0000 mg | ORAL_CAPSULE | Freq: Two times a day (BID) | ORAL | Status: DC

## 2015-12-10 NOTE — Progress Notes (Signed)
Subjective:  Patient ID: Sandra Hamilton, female    DOB: 07-Jan-1936  Age: 80 y.o. MRN: [id number]  CC: Leg Pain   HPI Sandra Hamilton presents for  follow-up of hypertension. Patient has no history of headache chest pain or shortness of breath or recent cough. Patient also denies symptoms of TIA such as numbness weakness lateralizing. Patient checks  blood pressure at home and has not had any elevated readings recently. Patient denies side effects from medication. States taking it regularly.  Nose bleed spontaneous last week. Resolved after about 10 min. Has had swelling and redness and pain ever since. No fever. No HA. No purulent nasal DC. No bleed before or since  Left shin pain has recurred reports that med worked last time needs it renewed. Pain is moderate, constant. A little better with rest   History Sandra Hamilton has a past medical history of Hypertension; Irritable bowel syndrome; and Arthritis.   She has past surgical history that includes Abdominal hysterectomy; Appendectomy; Thyroidectomy; Breast biopsy (Left); and Cataract extraction w/PHACO (Right, 11/12/2015).   Her family history includes Diabetes in her father; Heart disease in her mother.She reports that she has never smoked. She has never used smokeless tobacco. She reports that she does not drink alcohol or use illicit drugs.  Current Outpatient Prescriptions on File Prior to Visit  Medication Sig Dispense Refill  . amLODipine (NORVASC) 5 MG tablet TAKE 1 TABLET (5 MG TOTAL) BY MOUTH DAILY. 90 tablet 1  . calcium carbonate (OS-CAL) 600 MG TABS tablet Take 600 mg by mouth daily with breakfast.    . Elastic Bandages & Supports (V-2 HIGH COMPRESSION HOSE) MISC 1 each by Does not apply route daily. 1 each 0  . fluticasone (FLONASE) 50 MCG/ACT nasal spray Place 2 sprays into both nostrils daily. 16 g 11  . Misc Natural Products (OSTEO BI-FLEX JOINT SHIELD PO) Apply 1 application topically as needed.    . Omega-3 Fatty Acids (FISH  OIL) 1200 MG CAPS Take 1 capsule by mouth 2 (two) times daily.     No current facility-administered medications on file prior to visit.    ROS Review of Systems  Constitutional: Negative for fever, activity change and appetite change.  HENT: Positive for nosebleeds. Negative for congestion, rhinorrhea and sore throat.   Eyes: Negative for visual disturbance.  Respiratory: Negative for cough and shortness of breath.   Cardiovascular: Negative for chest pain and palpitations.  Gastrointestinal: Negative for nausea, abdominal pain and diarrhea.  Genitourinary: Negative for dysuria.  Musculoskeletal: Positive for myalgias and arthralgias.    Objective:  BP 121/72 mmHg  Pulse 84  Temp(Src) 97.3 F (36.3 C) (Oral)  Ht 5\' 1"  (1.549 m)  Wt 97 lb 3.2 oz (44.09 kg)  BMI 18.38 kg/m2  SpO2 99%  BP Readings from Last 3 Encounters:  12/10/15 121/72  11/12/15 112/68  10/28/15 130/73    Wt Readings from Last 3 Encounters:  12/10/15 97 lb 3.2 oz (44.09 kg)  10/28/15 100 lb 3.2 oz (45.45 kg)  10/21/15 135 lb (61.236 kg)     Physical Exam  Constitutional: She is oriented to person, place, and time. She appears well-developed and well-nourished. No distress.  HENT:  Head: Normocephalic and atraumatic.  Nose: Mucosal edema and sinus tenderness present. Epistaxis is observed. Right sinus exhibits no maxillary sinus tenderness and no frontal sinus tenderness. Left sinus exhibits no maxillary sinus tenderness and no frontal sinus tenderness.    Red and swollen at tip of nose.  Just inside right nare is a crust with purulent head, 2 mm   Eyes: Conjunctivae are normal. Pupils are equal, round, and reactive to light.  Neck: Normal range of motion. Neck supple. No thyromegaly present.  Cardiovascular: Normal rate, regular rhythm and normal heart sounds.   No murmur heard. Pulmonary/Chest: Effort normal and breath sounds normal. No respiratory distress. She has no wheezes. She has no rales.    Abdominal: Soft. Bowel sounds are normal. She exhibits no distension. There is no tenderness.  Musculoskeletal: Normal range of motion. She exhibits tenderness (left shin over tibia, moderate).  Lymphadenopathy:    She has no cervical adenopathy.  Neurological: She is alert and oriented to person, place, and time.  Skin: Skin is warm and dry.  Psychiatric: She has a normal mood and affect. Her behavior is normal. Judgment and thought content normal.     Lab Results  Component Value Date   WBC 5.2 10/21/2015   HGB 13.4 10/21/2015   HCT 41.0 10/21/2015   PLT 201 10/21/2015   GLUCOSE 106* 10/21/2015   CHOL 145 01/05/2015   TRIG 70 01/05/2015   HDL 70 01/05/2015   LDLCALC 126* 05/05/2014   ALT 14 01/05/2015   AST 21 01/05/2015   NA 141 10/21/2015   K 3.9 10/21/2015   CL 103 10/21/2015   CREATININE 0.63 10/21/2015   BUN 19 10/21/2015   CO2 29 10/21/2015    No results found.  Assessment & Plan:   Tishina was seen today for leg pain.  Diagnoses and all orders for this visit:  Shin splints, left, sequela  Renovascular hypertension  Cellulitis of nose, external  Other orders -     minocycline (MINOCIN) 100 MG capsule; Take 1 capsule (100 mg total) by mouth 2 (two) times daily. For ten days -     meloxicam (MOBIC) 15 MG tablet; Take 1 tablet (15 mg total) by mouth daily. For joint and muscle pain   I have discontinued Sandra Hamilton's levofloxacin. I am also having her start on minocycline and meloxicam. Additionally, I am having her maintain her Fish Oil, calcium carbonate, Misc Natural Products (OSTEO BI-FLEX JOINT SHIELD PO), V-2 HIGH COMPRESSION HOSE, fluticasone, and amLODipine.  Meds ordered this encounter  Medications  . minocycline (MINOCIN) 100 MG capsule    Sig: Take 1 capsule (100 mg total) by mouth 2 (two) times daily. For ten days    Dispense:  20 capsule    Refill:  0  . meloxicam (MOBIC) 15 MG tablet    Sig: Take 1 tablet (15 mg total) by mouth daily. For  joint and muscle pain    Dispense:  30 tablet    Refill:  5      Follow-up: Return in about 3 months (around 03/08/2016), or if symptoms worsen or fail to improve.  Claretta Fraise, M.D.

## 2015-12-14 DIAGNOSIS — H25812 Combined forms of age-related cataract, left eye: Secondary | ICD-10-CM | POA: Diagnosis not present

## 2015-12-16 HISTORY — PX: EYE SURGERY: SHX253

## 2015-12-21 ENCOUNTER — Encounter (HOSPITAL_COMMUNITY): Payer: Self-pay

## 2015-12-21 ENCOUNTER — Encounter (HOSPITAL_COMMUNITY)
Admission: RE | Admit: 2015-12-21 | Discharge: 2015-12-21 | Disposition: A | Discharge status: Home / Self Care | Payer: Medicare Other | Source: Ambulatory Visit | Attending: Ophthalmology | Admitting: Ophthalmology

## 2015-12-28 ENCOUNTER — Encounter (HOSPITAL_COMMUNITY)
Admission: RE | Disposition: A | Discharge status: Home / Self Care | Payer: Self-pay | Source: Ambulatory Visit | Attending: Ophthalmology

## 2015-12-28 ENCOUNTER — Ambulatory Visit (HOSPITAL_COMMUNITY): Payer: Medicare Other | Admitting: Anesthesiology

## 2015-12-28 ENCOUNTER — Ambulatory Visit (HOSPITAL_COMMUNITY)
Admission: RE | Admit: 2015-12-28 | Discharge: 2015-12-28 | Disposition: A | Discharge status: Home / Self Care | Payer: Medicare Other | Source: Ambulatory Visit | Attending: Ophthalmology | Admitting: Ophthalmology

## 2015-12-28 ENCOUNTER — Encounter (HOSPITAL_COMMUNITY): Payer: Self-pay | Admitting: *Deleted

## 2015-12-28 DIAGNOSIS — Z79899 Other long term (current) drug therapy: Secondary | ICD-10-CM | POA: Insufficient documentation

## 2015-12-28 DIAGNOSIS — M1991 Primary osteoarthritis, unspecified site: Secondary | ICD-10-CM | POA: Insufficient documentation

## 2015-12-28 DIAGNOSIS — H25812 Combined forms of age-related cataract, left eye: Secondary | ICD-10-CM | POA: Diagnosis not present

## 2015-12-28 DIAGNOSIS — I10 Essential (primary) hypertension: Secondary | ICD-10-CM | POA: Insufficient documentation

## 2015-12-28 DIAGNOSIS — Z7982 Long term (current) use of aspirin: Secondary | ICD-10-CM | POA: Insufficient documentation

## 2015-12-28 DIAGNOSIS — H2512 Age-related nuclear cataract, left eye: Secondary | ICD-10-CM | POA: Diagnosis not present

## 2015-12-28 DIAGNOSIS — H269 Unspecified cataract: Secondary | ICD-10-CM | POA: Diagnosis not present

## 2015-12-28 HISTORY — PX: CATARACT EXTRACTION W/PHACO: SHX586

## 2015-12-28 SURGERY — PHACOEMULSIFICATION, CATARACT, WITH IOL INSERTION
Anesthesia: Monitor Anesthesia Care | Site: Eye | Laterality: Left

## 2015-12-28 MED ORDER — LACTATED RINGERS IV SOLN
INTRAVENOUS | Status: DC
  Administered 2015-12-28: 1000 mL via INTRAVENOUS

## 2015-12-28 MED ORDER — MIDAZOLAM HCL 2 MG/2ML IJ SOLN
INTRAMUSCULAR | Status: AC
  Filled 2015-12-28: qty 2

## 2015-12-28 MED ORDER — EPINEPHRINE HCL 1 MG/ML IJ SOLN
INTRAMUSCULAR | Status: AC
  Filled 2015-12-28: qty 1

## 2015-12-28 MED ORDER — MIDAZOLAM HCL 2 MG/2ML IJ SOLN
1.0000 mg | INTRAMUSCULAR | Status: DC | PRN
  Administered 2015-12-28: 2 mg via INTRAVENOUS

## 2015-12-28 MED ORDER — CYCLOPENTOLATE-PHENYLEPHRINE 0.2-1 % OP SOLN
1.0000 [drp] | OPHTHALMIC | Status: AC
  Administered 2015-12-28 (×3): 1 [drp] via OPHTHALMIC

## 2015-12-28 MED ORDER — FENTANYL CITRATE (PF) 100 MCG/2ML IJ SOLN
INTRAMUSCULAR | Status: AC
  Filled 2015-12-28: qty 2

## 2015-12-28 MED ORDER — MIDAZOLAM HCL 2 MG/2ML IJ SOLN
INTRAMUSCULAR | Status: DC | PRN
  Administered 2015-12-28 (×2): 1 mg via INTRAVENOUS

## 2015-12-28 MED ORDER — TETRACAINE HCL 0.5 % OP SOLN
1.0000 [drp] | OPHTHALMIC | Status: AC
  Administered 2015-12-28 (×3): 1 [drp] via OPHTHALMIC

## 2015-12-28 MED ORDER — PHENYLEPHRINE HCL 2.5 % OP SOLN
1.0000 [drp] | OPHTHALMIC | Status: AC
  Administered 2015-12-28 (×3): 1 [drp] via OPHTHALMIC

## 2015-12-28 MED ORDER — PROVISC 10 MG/ML IO SOLN
INTRAOCULAR | Status: DC | PRN
  Administered 2015-12-28: 0.85 mL via INTRAOCULAR

## 2015-12-28 MED ORDER — POVIDONE-IODINE 5 % OP SOLN
OPHTHALMIC | Status: DC | PRN
  Administered 2015-12-28: 1 via OPHTHALMIC

## 2015-12-28 MED ORDER — FENTANYL CITRATE (PF) 100 MCG/2ML IJ SOLN
25.0000 ug | INTRAMUSCULAR | Status: AC
  Administered 2015-12-28 (×2): 25 ug via INTRAVENOUS

## 2015-12-28 MED ORDER — BSS IO SOLN
INTRAOCULAR | Status: DC | PRN
  Administered 2015-12-28: 15 mL

## 2015-12-28 MED ORDER — LIDOCAINE HCL 3.5 % OP GEL
1.0000 "application " | Freq: Once | OPHTHALMIC | Status: AC
  Administered 2015-12-28: 1 via OPHTHALMIC

## 2015-12-28 MED ORDER — NEOMYCIN-POLYMYXIN-DEXAMETH 3.5-10000-0.1 OP SUSP
OPHTHALMIC | Status: DC | PRN
  Administered 2015-12-28: 2 [drp] via OPHTHALMIC

## 2015-12-28 MED ORDER — LIDOCAINE HCL (PF) 1 % IJ SOLN
INTRAMUSCULAR | Status: DC | PRN
  Administered 2015-12-28: .6 mL

## 2015-12-28 MED ORDER — EPINEPHRINE HCL 1 MG/ML IJ SOLN
INTRAOCULAR | Status: DC | PRN
  Administered 2015-12-28: 500 mL

## 2015-12-28 SURGICAL SUPPLY — 12 items
CLOTH BEACON ORANGE TIMEOUT ST (SAFETY) ×2 IMPLANT
EYE SHIELD UNIVERSAL CLEAR (GAUZE/BANDAGES/DRESSINGS) ×2 IMPLANT
GLOVE BIOGEL PI IND STRL 6.5 (GLOVE) ×1 IMPLANT
GLOVE BIOGEL PI IND STRL 7.0 (GLOVE) ×1 IMPLANT
GLOVE BIOGEL PI INDICATOR 6.5 (GLOVE) ×1
GLOVE BIOGEL PI INDICATOR 7.0 (GLOVE) ×1
PAD ARMBOARD 7.5X6 YLW CONV (MISCELLANEOUS) ×2 IMPLANT
SIGHTPATH CAT PROC W REG LENS (Ophthalmic Related) ×2 IMPLANT
SYRINGE LUER LOK 1CC (MISCELLANEOUS) ×2 IMPLANT
TAPE SURG TRANSPORE 1 IN (GAUZE/BANDAGES/DRESSINGS) ×1 IMPLANT
TAPE SURGICAL TRANSPORE 1 IN (GAUZE/BANDAGES/DRESSINGS) ×1
WATER STERILE IRR 250ML POUR (IV SOLUTION) ×2 IMPLANT

## 2015-12-28 NOTE — Transfer of Care (Signed)
Immediate Anesthesia Transfer of Care Note  Patient: Sandra Hamilton  Procedure(s) Performed: Procedure(s) (LRB): CATARACT EXTRACTION PHACO AND INTRAOCULAR LENS PLACEMENT LEFT EYE CDE=4.79 (Left)  Patient Location: Shortstay  Anesthesia Type: MAC  Level of Consciousness: awake  Airway & Oxygen Therapy: Patient Spontanous Breathing   Post-op Assessment: Report given to PACU RN, Post -op Vital signs reviewed and stable and Patient moving all extremities  Post vital signs: Reviewed and stable  Complications: No apparent anesthesia complications

## 2015-12-28 NOTE — H&P (Signed)
I have reviewed the H&P, the patient was re-examined, and I have identified no interval changes in medical condition and plan of care since the history and physical of record  

## 2015-12-28 NOTE — Op Note (Signed)
Date of Admission: 12/28/2015  Date of Surgery: 12/28/2015   Pre-Op Dx: Cataract Left Eye  Post-Op Dx: Senile Combined Cataract Left  Eye,  Dx Code [id number]  Surgeon: Tonny Branch, M.D.  Assistants: None  Anesthesia: Topical with MAC  Indications: Painless, progressive loss of vision with compromise of daily activities.  Surgery: Cataract Extraction with Intraocular lens Implant Left Eye  Discription: The patient had dilating drops and viscous lidocaine placed into the Left eye in the pre-op holding area. After transfer to the operating room, a time out was performed. The patient was then prepped and draped. Beginning with a 72 degree blade a paracentesis port was made at the surgeon's 2 o'clock position. The anterior chamber was then filled with 1% non-preserved lidocaine. This was followed by filling the anterior chamber with Provisc.  A 2.89mm keratome blade was used to make a clear corneal incision at the temporal limbus.  A bent cystatome needle was used to create a continuous tear capsulotomy. Hydrodissection was performed with balanced salt solution on a Fine canula. The lens nucleus was then removed using the phacoemulsification handpiece. Residual cortex was removed with the I&A handpiece. The anterior chamber and capsular bag were refilled with Provisc. A posterior chamber intraocular lens was placed into the capsular bag with it's injector. The implant was positioned with the Kuglan hook. The Provisc was then removed from the anterior chamber and capsular bag with the I&A handpiece. Stromal hydration of the main incision and paracentesis port was performed with BSS on a Fine canula. The wounds were tested for leak which was negative. The patient tolerated the procedure well. There were no operative complications. The patient was then transferred to the recovery room in stable condition.  Complications: None  Specimen: None  EBL: None  Prosthetic device: Hoya iSert 250, power 23.5 D, SN  [id number].

## 2015-12-28 NOTE — Anesthesia Postprocedure Evaluation (Signed)
  Anesthesia Post-op Note  Patient: Sandra Hamilton  Procedure(s) Performed: Procedure(s) (LRB): CATARACT EXTRACTION PHACO AND INTRAOCULAR LENS PLACEMENT LEFT EYE CDE=4.79 (Left)  Patient Location:  Short Stay  Anesthesia Type: MAC  Level of Consciousness: awake  Airway and Oxygen Therapy: Patient Spontanous Breathing  Post-op Pain: none  Post-op Assessment: Post-op Vital signs reviewed, Patient's Cardiovascular Status Stable, Respiratory Function Stable, Patent Airway, No signs of Nausea or vomiting and Pain level controlled  Post-op Vital Signs: Reviewed and stable  Complications: No apparent anesthesia complications

## 2015-12-28 NOTE — Anesthesia Procedure Notes (Signed)
Procedure Name: MAC Date/Time: 12/28/2015 11:56 AM Performed by: Vista Deck Pre-anesthesia Checklist: Patient identified, Emergency Drugs available, Suction available, Timeout performed and Patient being monitored Patient Re-evaluated:Patient Re-evaluated prior to inductionOxygen Delivery Method: Nasal Cannula

## 2015-12-28 NOTE — Anesthesia Preprocedure Evaluation (Signed)
Anesthesia Evaluation  Patient identified by MRN, date of birth, ID band Patient awake    Reviewed: Allergy & Precautions, NPO status , Patient's Chart, lab work & pertinent test results  Airway Mallampati: I  TM Distance: >3 FB Neck ROM: Full    Dental  (+) Teeth Intact, Caps   Pulmonary    Pulmonary exam normal        Cardiovascular hypertension, Pt. on medications Normal cardiovascular exam     Neuro/Psych    GI/Hepatic   Endo/Other    Renal/GU      Musculoskeletal  (+) Arthritis , Osteoarthritis,    Abdominal Normal abdominal exam  (+)   Peds  Hematology   Anesthesia Other Findings   Reproductive/Obstetrics                             Anesthesia Physical Anesthesia Plan  ASA: II  Anesthesia Plan: MAC   Post-op Pain Management:    Induction: Intravenous  Airway Management Planned: Nasal Cannula  Additional Equipment:   Intra-op Plan:   Post-operative Plan:   Informed Consent: I have reviewed the patients History and Physical, chart, labs and discussed the procedure including the risks, benefits and alternatives for the proposed anesthesia with the patient or authorized representative who has indicated his/her understanding and acceptance.   Dental advisory given  Plan Discussed with: CRNA  Anesthesia Plan Comments:         Anesthesia Quick Evaluation

## 2015-12-28 NOTE — Discharge Instructions (Signed)
Anesthesia, Adult, Care After °Refer to this sheet in the next few weeks. These instructions provide you with information on caring for yourself after your procedure. Your health care provider may also give you more specific instructions. Your treatment has been planned according to current medical practices, but problems sometimes occur. Call your health care provider if you have any problems or questions after your procedure. °WHAT TO EXPECT AFTER THE PROCEDURE °After the procedure, it is typical to experience: °· Sleepiness. °· Nausea and vomiting. °HOME CARE INSTRUCTIONS °· For the first 24 hours after general anesthesia: °¨ Have a responsible person with you. °¨ Do not drive a car. If you are alone, do not take public transportation. °¨ Do not drink alcohol. °¨ Do not take medicine that has not been prescribed by your health care provider. °¨ Do not sign important papers or make important decisions. °¨ You may resume a normal diet and activities as directed by your health care provider. °· Change bandages (dressings) as directed. °· If you have questions or problems that seem related to general anesthesia, call the hospital and ask for the anesthetist or anesthesiologist on call. °SEEK MEDICAL CARE IF: °· You have nausea and vomiting that continue the day after anesthesia. °· You develop a rash. °SEEK IMMEDIATE MEDICAL CARE IF:  °· You have difficulty breathing. °· You have chest pain. °· You have any allergic problems. °  °This information is not intended to replace advice given to you by your health care provider. Make sure you discuss any questions you have with your health care provider. °  °Document Released: 01/09/2001 Document Revised: 10/24/2014 Document Reviewed: 02/01/2012 °[employer] Interactive Patient Education ©2016 [employer] ° °

## 2015-12-29 ENCOUNTER — Encounter (HOSPITAL_COMMUNITY): Payer: Self-pay | Admitting: Ophthalmology

## 2016-02-12 ENCOUNTER — Other Ambulatory Visit: Payer: Self-pay

## 2016-02-12 MED ORDER — AMLODIPINE BESYLATE 5 MG PO TABS: ORAL_TABLET | ORAL | Status: DC

## 2016-02-23 ENCOUNTER — Encounter (HOSPITAL_COMMUNITY): Payer: Self-pay | Admitting: Emergency Medicine

## 2016-02-23 ENCOUNTER — Ambulatory Visit (INDEPENDENT_AMBULATORY_CARE_PROVIDER_SITE_OTHER): Payer: Medicare Other | Admitting: Family Medicine

## 2016-02-23 ENCOUNTER — Emergency Department (HOSPITAL_COMMUNITY)
Admission: EM | Admit: 2016-02-23 | Discharge: 2016-02-23 | Disposition: A | Discharge status: Home / Self Care | Payer: Medicare Other | Attending: Emergency Medicine | Admitting: Emergency Medicine

## 2016-02-23 ENCOUNTER — Emergency Department (HOSPITAL_COMMUNITY): Payer: Medicare Other

## 2016-02-23 ENCOUNTER — Encounter: Payer: Self-pay | Admitting: Family Medicine

## 2016-02-23 VITALS — BP 124/65 | HR 98 | Temp 97.2°F | Ht 61.0 in | Wt 95.6 lb

## 2016-02-23 DIAGNOSIS — Z7951 Long term (current) use of inhaled steroids: Secondary | ICD-10-CM | POA: Diagnosis not present

## 2016-02-23 DIAGNOSIS — Z8719 Personal history of other diseases of the digestive system: Secondary | ICD-10-CM | POA: Insufficient documentation

## 2016-02-23 DIAGNOSIS — M9913 Subluxation complex (vertebral) of lumbar region: Secondary | ICD-10-CM | POA: Diagnosis not present

## 2016-02-23 DIAGNOSIS — R0602 Shortness of breath: Secondary | ICD-10-CM | POA: Diagnosis not present

## 2016-02-23 DIAGNOSIS — I10 Essential (primary) hypertension: Secondary | ICD-10-CM | POA: Diagnosis not present

## 2016-02-23 DIAGNOSIS — I6789 Other cerebrovascular disease: Secondary | ICD-10-CM | POA: Diagnosis not present

## 2016-02-23 DIAGNOSIS — L89159 Pressure ulcer of sacral region, unspecified stage: Secondary | ICD-10-CM | POA: Insufficient documentation

## 2016-02-23 DIAGNOSIS — Z79899 Other long term (current) drug therapy: Secondary | ICD-10-CM | POA: Insufficient documentation

## 2016-02-23 DIAGNOSIS — A419 Sepsis, unspecified organism: Secondary | ICD-10-CM | POA: Diagnosis not present

## 2016-02-23 DIAGNOSIS — F05 Delirium due to known physiological condition: Secondary | ICD-10-CM | POA: Diagnosis not present

## 2016-02-23 DIAGNOSIS — F4489 Other dissociative and conversion disorders: Secondary | ICD-10-CM | POA: Diagnosis not present

## 2016-02-23 DIAGNOSIS — R8290 Unspecified abnormal findings in urine: Secondary | ICD-10-CM | POA: Diagnosis not present

## 2016-02-23 DIAGNOSIS — Z791 Long term (current) use of non-steroidal anti-inflammatories (NSAID): Secondary | ICD-10-CM | POA: Insufficient documentation

## 2016-02-23 DIAGNOSIS — L8990 Pressure ulcer of unspecified site, unspecified stage: Secondary | ICD-10-CM

## 2016-02-23 DIAGNOSIS — M1990 Unspecified osteoarthritis, unspecified site: Secondary | ICD-10-CM | POA: Insufficient documentation

## 2016-02-23 LAB — URINALYSIS, ROUTINE W REFLEX MICROSCOPIC
Bilirubin Urine: NEGATIVE
GLUCOSE, UA: NEGATIVE mg/dL
HGB URINE DIPSTICK: NEGATIVE
KETONES UR: NEGATIVE mg/dL
LEUKOCYTES UA: NEGATIVE
Nitrite: POSITIVE — AB
PH: 7 (ref 5.0–8.0)
PROTEIN: NEGATIVE mg/dL
Specific Gravity, Urine: 1.01 (ref 1.005–1.030)

## 2016-02-23 LAB — URINALYSIS, COMPLETE
BILIRUBIN UA: NEGATIVE
GLUCOSE, UA: NEGATIVE
KETONES UA: NEGATIVE
NITRITE UA: POSITIVE — AB
Protein, UA: NEGATIVE
RBC UA: NEGATIVE
SPEC GRAV UA: 1.015 (ref 1.005–1.030)
UUROB: 0.2 mg/dL (ref 0.2–1.0)
pH, UA: 7 (ref 5.0–7.5)

## 2016-02-23 LAB — MICROSCOPIC EXAMINATION: RBC MICROSCOPIC, UA: NONE SEEN /HPF (ref 0–?)

## 2016-02-23 LAB — CBC WITH DIFFERENTIAL/PLATELET
BASOS ABS: 0 10*3/uL (ref 0.0–0.1)
Basophils Relative: 0 %
Eosinophils Absolute: 0.1 10*3/uL (ref 0.0–0.7)
Eosinophils Relative: 1 %
HEMATOCRIT: 38.3 % (ref 36.0–46.0)
Hemoglobin: 12.2 g/dL (ref 12.0–15.0)
LYMPHS ABS: 1.5 10*3/uL (ref 0.7–4.0)
LYMPHS PCT: 15 %
MCH: 27 pg (ref 26.0–34.0)
MCHC: 31.9 g/dL (ref 30.0–36.0)
MCV: 84.7 fL (ref 78.0–100.0)
MONO ABS: 0.5 10*3/uL (ref 0.1–1.0)
Monocytes Relative: 5 %
NEUTROS ABS: 8.1 10*3/uL — AB (ref 1.7–7.7)
Neutrophils Relative %: 79 %
Platelets: 358 10*3/uL (ref 150–400)
RBC: 4.52 MIL/uL (ref 3.87–5.11)
RDW: 14.2 % (ref 11.5–15.5)
WBC: 10.2 10*3/uL (ref 4.0–10.5)

## 2016-02-23 LAB — COMPREHENSIVE METABOLIC PANEL
ALBUMIN: 3.3 g/dL — AB (ref 3.5–5.0)
ALT: 18 U/L (ref 14–54)
ANION GAP: 12 (ref 5–15)
AST: 27 U/L (ref 15–41)
Alkaline Phosphatase: 85 U/L (ref 38–126)
BILIRUBIN TOTAL: 0.3 mg/dL (ref 0.3–1.2)
BUN: 11 mg/dL (ref 6–20)
CHLORIDE: 103 mmol/L (ref 101–111)
CO2: 28 mmol/L (ref 22–32)
Calcium: 9.1 mg/dL (ref 8.9–10.3)
Creatinine, Ser: 0.6 mg/dL (ref 0.44–1.00)
GFR calc Af Amer: >60 mL/min (ref >60)
GFR calc non Af Amer: >60 mL/min (ref >60)
GLUCOSE: 96 mg/dL (ref 65–99)
POTASSIUM: 3.3 mmol/L — AB (ref 3.5–5.1)
SODIUM: 143 mmol/L (ref 135–145)
TOTAL PROTEIN: 5.9 g/dL — AB (ref 6.5–8.1)

## 2016-02-23 LAB — URINE MICROSCOPIC-ADD ON

## 2016-02-23 MED ORDER — CEFTRIAXONE SODIUM 1 G IJ SOLR
1.0000 g | Freq: Once | INTRAMUSCULAR | Status: AC
  Administered 2016-02-23: 1 g via INTRAMUSCULAR

## 2016-02-23 MED ORDER — DOXYCYCLINE HYCLATE 100 MG PO TABS
100.0000 mg | ORAL_TABLET | Freq: Once | ORAL | Status: AC
  Administered 2016-02-23: 100 mg via ORAL
  Filled 2016-02-23: qty 1

## 2016-02-23 MED ORDER — DOXYCYCLINE HYCLATE 100 MG PO CAPS: 100.0000 mg | ORAL_CAPSULE | Freq: Two times a day (BID) | ORAL | Status: DC

## 2016-02-23 NOTE — ED Provider Notes (Signed)
CSN: [id number]     Arrival date & time 02/23/16  1706 History   First MD Initiated Contact with Patient 02/23/16 1708     Chief Complaint  Patient presents with  . Code Sepsis  PT HAS HAD A SACRAL ULCER FOR "AWHILE."  PT HAS BEEN CARING FOR IT AT HOME BY HERSELF.  SHE WENT TO HER PCP OFFICE TODAY.  THEY GAVE HER A SHOT OF IM ROCEPHIN AND CALLED EMS TO BRING HER TO THE ED.   (Consider location/radiation/quality/duration/timing/severity/associated sxs/prior Treatment) The history is provided by the patient and the EMS personnel.    Past Medical History  Diagnosis Date  . Hypertension   . Irritable bowel syndrome   . Arthritis    Past Surgical History  Procedure Laterality Date  . Abdominal hysterectomy    . Appendectomy    . Thyroidectomy    . Breast biopsy Left   . Cataract extraction w/phaco Right 11/12/2015    Procedure: CATARACT EXTRACTION PHACO AND INTRAOCULAR LENS PLACEMENT RIGHT EYE;  Surgeon: Tonny Branch, MD;  Location: [facility];  Service: Ophthalmology;  Laterality: Right;  CDE 7.40  . Cataract extraction w/phaco Left 12/28/2015    Procedure: CATARACT EXTRACTION PHACO AND INTRAOCULAR LENS PLACEMENT LEFT EYE CDE=4.79;  Surgeon: Tonny Branch, MD;  Location: [facility];  Service: Ophthalmology;  Laterality: Left;   Family History  Problem Relation Age of Onset  . Heart disease Mother   . Diabetes Father    Social History  Substance Use Topics  . Smoking status: Never Smoker   . Smokeless tobacco: Never Used  . Alcohol Use: No   OB History    No data available     Review of Systems  Skin: Positive for wound.  All other systems reviewed and are negative.     Allergies  Bextra; Codeine; Detrol la; Lipitor; and Prednisone  Home Medications   Prior to Admission medications   Medication Sig Start Date End Date Taking? Authorizing Provider  amLODipine (NORVASC) 5 MG tablet TAKE 1 TABLET (5 MG TOTAL) BY MOUTH DAILY. 02/12/16  Yes Claretta Fraise, MD  calcium carbonate  (OS-CAL) 600 MG TABS tablet Take 600 mg by mouth daily with breakfast.   Yes Historical Provider, MD  fluticasone (FLONASE) 50 MCG/ACT nasal spray Place 2 sprays into both nostrils daily. 07/01/15  Yes Timmothy Euler, MD  ibuprofen (ADVIL,MOTRIN) 200 MG tablet Take 200-400 mg by mouth every 6 (six) hours as needed for moderate pain.   Yes Historical Provider, MD  meloxicam (MOBIC) 15 MG tablet Take 1 tablet (15 mg total) by mouth daily. For joint and muscle pain Patient taking differently: Take 15 mg by mouth 2 (two) times daily. For joint and muscle pain 12/10/15  Yes Claretta Fraise, MD  minocycline (MINOCIN) 100 MG capsule Take 1 capsule (100 mg total) by mouth 2 (two) times daily. For ten days 12/10/15  Yes Claretta Fraise, MD  Misc Natural Products (OSTEO BI-FLEX JOINT SHIELD PO) Apply 1 application topically as needed (pain).    Yes Historical Provider, MD  Omega-3 Fatty Acids (FISH OIL) 1200 MG CAPS Take 1 capsule by mouth 2 (two) times daily.   Yes Historical Provider, MD  doxycycline (VIBRAMYCIN) 100 MG capsule Take 1 capsule (100 mg total) by mouth 2 (two) times daily. 02/23/16   Isla Pence, MD   BP 147/83 mmHg  Pulse 80  Temp(Src) 98.9 F (37.2 C) (Rectal)  Resp 16  Ht 5\' 2"  (1.575 m)  Wt 95 lb (43.092  kg)  BMI 17.37 kg/m2  SpO2 100% Physical Exam  Constitutional: She appears well-developed and well-nourished.  HENT:  Head: Normocephalic and atraumatic.  Right Ear: External ear normal.  Left Ear: External ear normal.  Nose: Nose normal.  Mouth/Throat: Oropharynx is clear and moist.  Eyes: Conjunctivae are normal. Pupils are equal, round, and reactive to light.  Neck: Normal range of motion. Neck supple.  Cardiovascular: Normal rate, regular rhythm, normal heart sounds and intact distal pulses.   Pulmonary/Chest: Effort normal and breath sounds normal.  Abdominal: Soft. Bowel sounds are normal.  Musculoskeletal: Normal range of motion.  Skin:     Nursing note and vitals  reviewed.   ED Course  Procedures (including critical care time) Labs Review Labs Reviewed  COMPREHENSIVE METABOLIC PANEL - Abnormal; Notable for the following:    Potassium 3.3 (*)    Total Protein 5.9 (*)    Albumin 3.3 (*)    All other components within normal limits  CBC WITH DIFFERENTIAL/PLATELET - Abnormal; Notable for the following:    Neutro Abs 8.1 (*)    All other components within normal limits  URINALYSIS, ROUTINE W REFLEX MICROSCOPIC (NOT AT [facility]) - Abnormal; Notable for the following:    Nitrite POSITIVE (*)    All other components within normal limits  URINE MICROSCOPIC-ADD ON - Abnormal; Notable for the following:    Squamous Epithelial / LPF 0-5 (*)    Bacteria, UA FEW (*)    All other components within normal limits  URINE CULTURE  CULTURE, BLOOD (ROUTINE X 2)  CULTURE, BLOOD (ROUTINE X 2)  WOUND CULTURE    Imaging Review Dg Chest 2 View  02/23/2016  CLINICAL DATA:  Shortness of breath.  Anal infection. EXAM: CHEST  2 VIEW COMPARISON:  None. FINDINGS: The heart size and mediastinal contours are within normal limits. Both lungs are clear. No evidence of pleural effusion or pneumothorax. Thoracolumbar dextroscoliosis noted. IMPRESSION: No active cardiopulmonary disease. Electronically Signed   By: Earle Gell M.D.   On: 02/23/2016 18:22   Dg Sacrum/coccyx  02/23/2016  CLINICAL DATA:  Anal infection. EXAM: SACRUM AND COCCYX - 2+ VIEW COMPARISON:  10/23/2014 FINDINGS: Severe arthropathy of both hips with flattening of both femurs and lateral subluxation of both femurs along the acetabula. Full-thickness loss of articular cartilage with confluent degenerative subcortical cystic lesions in both femoral heads and acetabula. Bony demineralization. Deformities of the left pubic bones related to the fractures from January of last year. The bony demineralization is severe enough that some cortical margins are highly indistinct especially in the sacrum and coccyx. Anterolisthesis  at L3-4 and L4-5 with loss of disc height at L5-S1. Mild degenerative findings in the sacroiliac joints. IMPRESSION: 1. No compelling findings of sacrococcygeal osteomyelitis, although sensitivity is low due to the bony demineralization. Next item markedly severe arthropathy of both hips. 2. Healed fractures the left pubic rami. 3. Lower lumbar subluxations, spondylosis, and degenerative disc disease, as shown on the CT scan from 2014. 4. Mild degenerative findings in both sacroiliac joints. Electronically Signed   By: Van Clines M.D.   On: 02/23/2016 18:21   I have personally reviewed and evaluated these images and lab results as part of my medical decision-making.   EKG Interpretation   Date/Time:  Tuesday Feb 23 2016 17:13:39 EDT Ventricular Rate:  83 PR Interval:  142 QRS Duration: 74 QT Interval:  415 QTC Calculation: 488 R Axis:   -30 Text Interpretation:  Sinus rhythm Atrial premature complex Probable  left  atrial enlargement Left axis deviation Anterior infarct, old Confirmed by  [facility] MD, Deborrah Mabin ([id number]) on 02/23/2016 5:16:57 PM      MDM  I DO NOT THINK PT IS SEPTIC.  BP HAS BEEN GOOD.  SHE IS AFEBRILE.  SHE LOOKS GOOD.  SHE HAS BEEN AMBULATORY WHILE HERE.  PT OK FOR D/C HOME.  SHE WILL BE REFERRED TO THE WOUND CARE CLINIC NEAR HER HOUSE IN [place]. Final diagnoses:  Sacral decubitus ulcer, unspecified pressure ulcer stage        Isla Pence, MD 02/23/16 2226

## 2016-02-23 NOTE — Progress Notes (Addendum)
Subjective:  Patient ID: Sandra Hamilton, female    DOB: [birth year]  Age: 80 y.o. MRN: [id number]  CC: Abscess   HPI Sandra Hamilton presents for *Lesion on buttocks. She says there are 3 sores. Her worst on the bottom has been leaking yellow fluid. It is soiled several sets of closing and her bedclothes. She says it has been like this for several weeks. She also states that this is the first time she's been to see [place] in this office even though we have been in this office for 20 years and she has documented multiple visits. Additionally she says that her daughter comes to her house to clean it every other day. Her daughter was later contacted during the visit and noted that she had been unable to check on her mother because her husband has been ill recently. Patient states that she's been cleaning the lesion on her buttocks regularly. She denies any fever chills or sweats. The lesion is moderately painful but her biggest concern is that it is messy. She is having some dysuria.   History Sandra Hamilton has a past medical history of Hypertension; Irritable bowel syndrome; and Arthritis.   She has past surgical history that includes Abdominal hysterectomy; Appendectomy; Thyroidectomy; Breast biopsy (Left); Cataract extraction w/PHACO (Right, 11/12/2015); and Cataract extraction w/PHACO (Left, 12/28/2015).   Her family history includes Diabetes in her father; Heart disease in her mother.She reports that she has never smoked. She has never used smokeless tobacco. She reports that she does not drink alcohol or use illicit drugs.    ROS Review of Systems  Constitutional: Negative for fever, activity change and appetite change.  HENT: Negative for congestion, rhinorrhea and sore throat.   Eyes: Negative for visual disturbance.  Respiratory: Negative for cough and shortness of breath.   Cardiovascular: Negative for chest pain and palpitations.  Gastrointestinal: Negative for nausea, abdominal pain and diarrhea.    Genitourinary: Negative for dysuria.  Musculoskeletal: Negative for myalgias and arthralgias.  Skin: Positive for wound.    Objective:  BP 124/65 mmHg  Pulse 98  Temp(Src) 97.2 F (36.2 C) (Oral)  Ht 5\' 1"  (1.549 m)  Wt 95 lb 9.6 oz (43.364 kg)  BMI 18.07 kg/m2  SpO2 97%  BP Readings from Last 3 Encounters:  02/23/16 119/71  02/23/16 124/65  12/28/15 96/52    Wt Readings from Last 3 Encounters:  02/23/16 95 lb (43.092 kg)  02/23/16 95 lb 9.6 oz (43.364 kg)  12/10/15 97 lb 3.2 oz (44.09 kg)     Physical Exam  Constitutional: She appears well-developed and well-nourished. No distress.  HENT:  Head: Normocephalic and atraumatic.  Right Ear: External ear normal.  Left Ear: External ear normal.  Nose: Nose normal.  Mouth/Throat: Oropharynx is clear and moist.  Eyes: Conjunctivae and EOM are normal. Pupils are equal, round, and reactive to light.  Neck: Normal range of motion. Neck supple. No thyromegaly present.  Cardiovascular: Normal rate, regular rhythm and normal heart sounds.   No murmur heard. Pulmonary/Chest: Effort normal and breath sounds normal. No respiratory distress. She has no wheezes. She has no rales.  Abdominal: Soft. Bowel sounds are normal. She exhibits no distension. There is no tenderness.  Lymphadenopathy:    She has no cervical adenopathy.  Neurological: She is alert. She has normal reflexes.  Skin: Skin is warm and dry.  There is a 4 cm full-thickness decubitus at the midline sacrum at the fold of the buttocks. This has copious mucopurulent drainage.  It is irregular and erythematous and there is muscle tissue visible at the base.  Psychiatric: Her speech is normal. Judgment normal. Her mood appears anxious. Her affect is blunt. Her affect is not labile. She is slowed. Cognition and memory are impaired.     Lab Results  Component Value Date   WBC 10.2 02/23/2016   HGB 12.2 02/23/2016   HCT 38.3 02/23/2016   PLT 358 02/23/2016   GLUCOSE 96  02/23/2016   CHOL 145 01/05/2015   TRIG 70 01/05/2015   HDL 70 01/05/2015   LDLCALC 126* 05/05/2014   ALT 18 02/23/2016   AST 27 02/23/2016   NA 143 02/23/2016   K 3.3* 02/23/2016   CL 103 02/23/2016   CREATININE 0.60 02/23/2016   BUN 11 02/23/2016   CO2 28 02/23/2016    No results found.  Assessment & Plan:   Sandra Hamilton was seen today for abscess.  Diagnoses and all orders for this visit:  Decubitus ulcer -     cefTRIAXone (ROCEPHIN) injection 1 g; Inject 1 g into the muscle once.  Foul smelling urine -     Urinalysis, Complete -     Urine culture -     cefTRIAXone (ROCEPHIN) injection 1 g; Inject 1 g into the muscle once.  Acute confusional state -     Urinalysis, Complete -     Urine culture -     cefTRIAXone (ROCEPHIN) injection 1 g; Inject 1 g into the muscle once.  Other orders -     Microscopic Examination  Due to the extensive nature of the ulcer, the infected nature of it and the infected nature. Her urine and the likelihood that this has led to severe at least some bacteremia that has been affecting her level of consciousness it was felt that she should be treated in house. Report was given to [facility] E.D. EMS Contacted and transport performed.  I am having Ms. Berhe maintain her Fish Oil, calcium carbonate, Misc Natural Products (OSTEO BI-FLEX JOINT SHIELD PO), V-2 HIGH COMPRESSION HOSE, fluticasone, minocycline, meloxicam, ibuprofen, and amLODipine. We administered cefTRIAXone.  Meds ordered this encounter  Medications  . cefTRIAXone (ROCEPHIN) injection 1 g    Sig:     Order Specific Question:  Antibiotic Indication:    Answer:  Other Indication (list below)    Order Specific Question:  Other Indication:    Answer:  decubitis ulcer     Follow-up: Return After hospital release.  Claretta Fraise, M.D.

## 2016-02-23 NOTE — Discharge Instructions (Signed)
ASK YOUR PCP FOR A REFERRAL TO THE WOUND CARE CENTER IN [place].

## 2016-02-23 NOTE — ED Notes (Addendum)
Pt brought to ED by [facility] from [facility] medicine office to RO sepsis, pt had a pressures ulcer on her rear, AOx3, EMS states VS WNL except SPO2 88%. Pressure ulcer present on her rear unable to stage due to drainage and sludge present on arrival. Rocephin IM given PTA.

## 2016-02-24 ENCOUNTER — Telehealth: Payer: Self-pay | Admitting: Family Medicine

## 2016-02-24 ENCOUNTER — Other Ambulatory Visit: Payer: Self-pay | Admitting: *Deleted

## 2016-02-24 DIAGNOSIS — L89159 Pressure ulcer of sacral region, unspecified stage: Secondary | ICD-10-CM

## 2016-02-24 NOTE — Telephone Encounter (Signed)
Spoke to pt's daughter and she is aware we are taking care of the referral and will receive a phone call with appt details once it is made.Pt's daughter voiced understanding.

## 2016-02-24 NOTE — Addendum Note (Signed)
Addended by: Marin Olp on: 02/24/2016 11:07 AM   Modules accepted: Orders

## 2016-02-25 ENCOUNTER — Telehealth: Payer: Self-pay | Admitting: Family Medicine

## 2016-02-25 LAB — URINE CULTURE
Culture: 20000 — AB
SPECIAL REQUESTS: NORMAL

## 2016-02-26 ENCOUNTER — Other Ambulatory Visit: Payer: Self-pay | Admitting: *Deleted

## 2016-02-26 DIAGNOSIS — L89154 Pressure ulcer of sacral region, stage 4: Secondary | ICD-10-CM | POA: Diagnosis not present

## 2016-02-26 DIAGNOSIS — R531 Weakness: Secondary | ICD-10-CM | POA: Diagnosis not present

## 2016-02-26 LAB — WOUND CULTURE

## 2016-02-26 LAB — URINE CULTURE

## 2016-02-26 MED ORDER — CIPROFLOXACIN HCL 500 MG PO TABS: 500.0000 mg | ORAL_TABLET | Freq: Two times a day (BID) | ORAL | Status: DC

## 2016-02-26 NOTE — Addendum Note (Signed)
Addended by: Louann Sjogren A on: 02/26/2016 09:03 AM   Modules accepted: Orders

## 2016-02-28 LAB — CULTURE, BLOOD (ROUTINE X 2)
CULTURE: NO GROWTH
Culture: NO GROWTH

## 2016-02-29 DIAGNOSIS — R531 Weakness: Secondary | ICD-10-CM | POA: Diagnosis not present

## 2016-02-29 DIAGNOSIS — L89154 Pressure ulcer of sacral region, stage 4: Secondary | ICD-10-CM | POA: Diagnosis not present

## 2016-03-01 ENCOUNTER — Telehealth: Payer: Self-pay | Admitting: Family Medicine

## 2016-03-01 NOTE — Telephone Encounter (Signed)
Verbal order given  

## 2016-03-01 NOTE — Telephone Encounter (Signed)
Sandra Hamilton I already gave verbal ok not sure if anything else needs to be done

## 2016-03-01 NOTE — Telephone Encounter (Signed)
Please call with verbal order as requested

## 2016-03-02 DIAGNOSIS — I10 Essential (primary) hypertension: Secondary | ICD-10-CM | POA: Diagnosis not present

## 2016-03-02 DIAGNOSIS — Z888 Allergy status to other drugs, medicaments and biological substances status: Secondary | ICD-10-CM | POA: Diagnosis not present

## 2016-03-02 DIAGNOSIS — Z885 Allergy status to narcotic agent status: Secondary | ICD-10-CM | POA: Diagnosis not present

## 2016-03-02 DIAGNOSIS — M1990 Unspecified osteoarthritis, unspecified site: Secondary | ICD-10-CM | POA: Diagnosis not present

## 2016-03-02 DIAGNOSIS — L89154 Pressure ulcer of sacral region, stage 4: Secondary | ICD-10-CM | POA: Diagnosis not present

## 2016-03-02 DIAGNOSIS — L89159 Pressure ulcer of sacral region, unspecified stage: Secondary | ICD-10-CM | POA: Diagnosis not present

## 2016-03-02 DIAGNOSIS — Z79899 Other long term (current) drug therapy: Secondary | ICD-10-CM | POA: Diagnosis not present

## 2016-03-03 DIAGNOSIS — R531 Weakness: Secondary | ICD-10-CM | POA: Diagnosis not present

## 2016-03-03 DIAGNOSIS — L89154 Pressure ulcer of sacral region, stage 4: Secondary | ICD-10-CM | POA: Diagnosis not present

## 2016-03-07 ENCOUNTER — Ambulatory Visit (INDEPENDENT_AMBULATORY_CARE_PROVIDER_SITE_OTHER): Payer: Medicare Other | Admitting: Family Medicine

## 2016-03-07 ENCOUNTER — Encounter: Payer: Self-pay | Admitting: Family Medicine

## 2016-03-07 VITALS — BP 109/71 | HR 80 | Temp 97.2°F | Ht 61.0 in | Wt 101.4 lb

## 2016-03-07 DIAGNOSIS — R531 Weakness: Secondary | ICD-10-CM | POA: Diagnosis not present

## 2016-03-07 DIAGNOSIS — F0390 Unspecified dementia without behavioral disturbance: Secondary | ICD-10-CM | POA: Diagnosis not present

## 2016-03-07 DIAGNOSIS — R601 Generalized edema: Secondary | ICD-10-CM | POA: Diagnosis not present

## 2016-03-07 DIAGNOSIS — L8990 Pressure ulcer of unspecified site, unspecified stage: Secondary | ICD-10-CM | POA: Diagnosis not present

## 2016-03-07 DIAGNOSIS — L89154 Pressure ulcer of sacral region, stage 4: Secondary | ICD-10-CM | POA: Diagnosis not present

## 2016-03-07 DIAGNOSIS — I10 Essential (primary) hypertension: Secondary | ICD-10-CM | POA: Insufficient documentation

## 2016-03-07 MED ORDER — DONEPEZIL HCL 5 MG PO TABS: 5.0000 mg | ORAL_TABLET | Freq: Every day | ORAL | Status: DC

## 2016-03-07 NOTE — Patient Instructions (Signed)
Cut your amlodipine in half and take only one half daily

## 2016-03-07 NOTE — Progress Notes (Signed)
Subjective:  Patient ID: Sandra Hamilton, female    DOB: Sep 25, 1936  Age: 80 y.o. MRN: [id number]  CC: Dementia   HPI Sandra Hamilton presents for swelling in feet. Family concerned about memory. Pt. Unable to remember recent events.  follow-up of hypertension. Patient has no history of headache chest pain or shortness of breath or recent cough. Patient also denies symptoms of TIA such as numbness weakness lateralizing. Patient checks  blood pressure at home and has not had any elevated readings recently. Patient denies side effects from his medication. States taking it regularly. She says that the swelling gets worse through the day and does not completely go down even when she lays down. She denies any orthopnea. No PND. And also no shortness of breath or fatigue. Home health is addressing her decubitus at this point. They are checking on her blood pressure as well.   History Sandra Hamilton has a past medical history of Hypertension; Irritable bowel syndrome; and Arthritis.   She has past surgical history that includes Abdominal hysterectomy; Appendectomy; Thyroidectomy; Breast biopsy (Left); Cataract extraction w/PHACO (Right, 11/12/2015); and Cataract extraction w/PHACO (Left, 12/28/2015).   Her family history includes Diabetes in her father; Heart disease in her mother.She reports that she has never smoked. She has never used smokeless tobacco. She reports that she does not drink alcohol or use illicit drugs.    ROS Review of Systems  Constitutional: Negative for fever, activity change and appetite change.  HENT: Negative for congestion, rhinorrhea and sore throat.   Eyes: Negative for visual disturbance.  Respiratory: Negative for cough and shortness of breath.   Cardiovascular: Negative for chest pain and palpitations.  Gastrointestinal: Negative for nausea, abdominal pain and diarrhea.  Genitourinary: Negative for dysuria.  Musculoskeletal: Negative for myalgias and arthralgias.  Skin:  Positive for wound.    Objective:  BP 109/71 mmHg  Pulse 80  Temp(Src) 97.2 F (36.2 C) (Oral)  Ht 5' 1"  (1.549 m)  Wt 101 lb 6.4 oz (45.995 kg)  BMI 19.17 kg/m2  SpO2 80%  BP Readings from Last 3 Encounters:  03/07/16 109/71  02/23/16 147/83  02/23/16 124/65    Wt Readings from Last 3 Encounters:  03/07/16 101 lb 6.4 oz (45.995 kg)  02/23/16 95 lb (43.092 kg)  02/23/16 95 lb 9.6 oz (43.364 kg)     Physical Exam  Constitutional: She appears well-developed and well-nourished. No distress.  HENT:  Head: Normocephalic and atraumatic.  Right Ear: External ear normal.  Left Ear: External ear normal.  Nose: Nose normal.  Mouth/Throat: Oropharynx is clear and moist.  Eyes: Conjunctivae and EOM are normal. Pupils are equal, round, and reactive to light.  Neck: Normal range of motion. Neck supple. No thyromegaly present.  Cardiovascular: Normal rate, regular rhythm and normal heart sounds.   No murmur heard. Pulmonary/Chest: Effort normal and breath sounds normal. No respiratory distress. She has no wheezes. She has no rales.  Abdominal: Soft. Bowel sounds are normal. She exhibits no distension. There is no tenderness.  Lymphadenopathy:    She has no cervical adenopathy.  Neurological: She is alert. She has normal reflexes.  Skin: Skin is warm and dry.  There is a 4 cm full-thickness decubitus at the midline sacrum at the fold of the buttocks. This has copious mucopurulent drainage. It is irregular and erythematous and there is muscle tissue visible at the base.  Psychiatric: Her speech is normal. Judgment normal. Her mood appears anxious. Her affect is blunt. Her affect is  not labile. She is slowed. Cognition and memory are impaired.   MMSE - Mini Mental State Exam 03/07/2016 07/01/2015  Orientation to time 5 5  Orientation to Place 5 5  Registration 3 3  Attention/ Calculation 3 5  Recall 0 0  Language- name 2 objects 2 2  Language- repeat 1 1  Language- follow 3 step  command 3 3  Language- read & follow direction 1 1  Write a sentence 1 1  Copy design 1 1  Total score 25 27     Lab Results  Component Value Date   WBC 10.2 02/23/2016   HGB 12.2 02/23/2016   HCT 38.3 02/23/2016   PLT 358 02/23/2016   GLUCOSE 96 02/23/2016   CHOL 145 01/05/2015   TRIG 70 01/05/2015   HDL 70 01/05/2015   LDLCALC 126* 05/05/2014   ALT 18 02/23/2016   AST 27 02/23/2016   NA 143 02/23/2016   K 3.3* 02/23/2016   CL 103 02/23/2016   CREATININE 0.60 02/23/2016   BUN 11 02/23/2016   CO2 28 02/23/2016    Dg Chest 2 View  02/23/2016  CLINICAL DATA:  Shortness of breath.  Anal infection. EXAM: CHEST  2 VIEW COMPARISON:  None. FINDINGS: The heart size and mediastinal contours are within normal limits. Both lungs are clear. No evidence of pleural effusion or pneumothorax. Thoracolumbar dextroscoliosis noted. IMPRESSION: No active cardiopulmonary disease. Electronically Signed   By: Earle Gell M.D.   On: 02/23/2016 18:22   Dg Sacrum/coccyx  02/23/2016  CLINICAL DATA:  Anal infection. EXAM: SACRUM AND COCCYX - 2+ VIEW COMPARISON:  10/23/2014 FINDINGS: Severe arthropathy of both hips with flattening of both femurs and lateral subluxation of both femurs along the acetabula. Full-thickness loss of articular cartilage with confluent degenerative subcortical cystic lesions in both femoral heads and acetabula. Bony demineralization. Deformities of the left pubic bones related to the fractures from January of last year. The bony demineralization is severe enough that some cortical margins are highly indistinct especially in the sacrum and coccyx. Anterolisthesis at L3-4 and L4-5 with loss of disc height at L5-S1. Mild degenerative findings in the sacroiliac joints. IMPRESSION: 1. No compelling findings of sacrococcygeal osteomyelitis, although sensitivity is low due to the bony demineralization. Next item markedly severe arthropathy of both hips. 2. Healed fractures the left pubic rami.  3. Lower lumbar subluxations, spondylosis, and degenerative disc disease, as shown on the CT scan from 2014. 4. Mild degenerative findings in both sacroiliac joints. Electronically Signed   By: Van Clines M.D.   On: 02/23/2016 18:21    Assessment & Plan:   Sandra Hamilton was seen today for dementia.  Diagnoses and all orders for this visit:  Dementia, without behavioral disturbance -     TSH  Generalized edema -     CMP14+EGFR -     TSH  Essential hypertension -     CMP14+EGFR -     TSH  Decubitus ulcer -     CBC with Differential/Platelet -     CMP14+EGFR -     TSH  Other orders -     donepezil (ARICEPT) 5 MG tablet; Take 1 tablet (5 mg total) by mouth at bedtime.    Continue home health/wound care   I have discontinued Sandra Hamilton's ciprofloxacin. I am also having her start on donepezil. Additionally, I am having her maintain her Fish Oil, calcium carbonate, Misc Natural Products (OSTEO BI-FLEX JOINT SHIELD PO), fluticasone, minocycline, meloxicam, ibuprofen, amLODipine, and doxycycline.  Meds ordered this encounter  Medications  . donepezil (ARICEPT) 5 MG tablet    Sig: Take 1 tablet (5 mg total) by mouth at bedtime.    Dispense:  30 tablet    Refill:  1     Follow-up: Return in about 1 month (around 04/07/2016) for Edema  and memory.  Claretta Fraise, M.D.

## 2016-03-08 ENCOUNTER — Telehealth: Payer: Self-pay | Admitting: Family Medicine

## 2016-03-08 DIAGNOSIS — R531 Weakness: Secondary | ICD-10-CM | POA: Diagnosis not present

## 2016-03-08 DIAGNOSIS — L89154 Pressure ulcer of sacral region, stage 4: Secondary | ICD-10-CM | POA: Diagnosis not present

## 2016-03-08 LAB — CBC WITH DIFFERENTIAL/PLATELET
BASOS ABS: 0 10*3/uL (ref 0.0–0.2)
Basos: 1 %
EOS (ABSOLUTE): 0.2 10*3/uL (ref 0.0–0.4)
EOS: 5 %
Hematocrit: 37.6 % (ref 34.0–46.6)
Hemoglobin: 11.9 g/dL (ref 11.1–15.9)
IMMATURE GRANULOCYTES: 0 %
Immature Grans (Abs): 0 10*3/uL (ref 0.0–0.1)
LYMPHS ABS: 0.9 10*3/uL (ref 0.7–3.1)
Lymphs: 19 %
MCH: 26.8 pg (ref 26.6–33.0)
MCHC: 31.6 g/dL (ref 31.5–35.7)
MCV: 85 fL (ref 79–97)
MONOS ABS: 0.3 10*3/uL (ref 0.1–0.9)
Monocytes: 5 %
NEUTROS PCT: 70 %
Neutrophils Absolute: 3.4 10*3/uL (ref 1.4–7.0)
PLATELETS: 281 10*3/uL (ref 150–379)
RBC: 4.44 x10E6/uL (ref 3.77–5.28)
RDW: 15.9 % — AB (ref 12.3–15.4)
WBC: 4.8 10*3/uL (ref 3.4–10.8)

## 2016-03-08 LAB — CMP14+EGFR
A/G RATIO: 1.8 (ref 1.2–2.2)
ALT: 13 IU/L (ref 0–32)
AST: 18 IU/L (ref 0–40)
Albumin: 4.3 g/dL (ref 3.5–4.8)
Alkaline Phosphatase: 99 IU/L (ref 39–117)
BILIRUBIN TOTAL: 0.5 mg/dL (ref 0.0–1.2)
BUN/Creatinine Ratio: 24 (ref 12–28)
BUN: 14 mg/dL (ref 8–27)
CALCIUM: 9 mg/dL (ref 8.7–10.3)
CHLORIDE: 102 mmol/L (ref 96–106)
CO2: 24 mmol/L (ref 18–29)
Creatinine, Ser: 0.59 mg/dL (ref 0.57–1.00)
GFR, EST AFRICAN AMERICAN: 102 mL/min/{1.73_m2} (ref >59)
GFR, EST NON AFRICAN AMERICAN: 89 mL/min/{1.73_m2} (ref >59)
Globulin, Total: 2.4 g/dL (ref 1.5–4.5)
Glucose: 95 mg/dL (ref 65–99)
POTASSIUM: 3.7 mmol/L (ref 3.5–5.2)
SODIUM: 144 mmol/L (ref 134–144)
Total Protein: 6.7 g/dL (ref 6.0–8.5)

## 2016-03-08 LAB — TSH: TSH: 3.52 u[IU]/mL (ref 0.450–4.500)

## 2016-03-08 MED ORDER — ENSURE HIGH PROTEIN PO LIQD
ORAL | Status: DC
Start: 2016-03-08 — End: 2017-02-14

## 2016-03-08 NOTE — Telephone Encounter (Signed)
Family aware of results and they would like a rx for ensure sent to pharmacy please.

## 2016-03-10 DIAGNOSIS — R531 Weakness: Secondary | ICD-10-CM | POA: Diagnosis not present

## 2016-03-10 DIAGNOSIS — L89154 Pressure ulcer of sacral region, stage 4: Secondary | ICD-10-CM | POA: Diagnosis not present

## 2016-03-11 DIAGNOSIS — L89154 Pressure ulcer of sacral region, stage 4: Secondary | ICD-10-CM | POA: Diagnosis not present

## 2016-03-11 DIAGNOSIS — R531 Weakness: Secondary | ICD-10-CM | POA: Diagnosis not present

## 2016-03-14 DIAGNOSIS — F0390 Unspecified dementia without behavioral disturbance: Secondary | ICD-10-CM | POA: Diagnosis not present

## 2016-03-14 DIAGNOSIS — Z79899 Other long term (current) drug therapy: Secondary | ICD-10-CM | POA: Diagnosis not present

## 2016-03-14 DIAGNOSIS — E871 Hypo-osmolality and hyponatremia: Secondary | ICD-10-CM | POA: Diagnosis not present

## 2016-03-14 DIAGNOSIS — L89154 Pressure ulcer of sacral region, stage 4: Secondary | ICD-10-CM | POA: Diagnosis not present

## 2016-03-14 DIAGNOSIS — R748 Abnormal levels of other serum enzymes: Secondary | ICD-10-CM | POA: Diagnosis not present

## 2016-03-14 DIAGNOSIS — R109 Unspecified abdominal pain: Secondary | ICD-10-CM | POA: Diagnosis not present

## 2016-03-14 DIAGNOSIS — K5660 Unspecified intestinal obstruction: Secondary | ICD-10-CM | POA: Diagnosis not present

## 2016-03-15 ENCOUNTER — Inpatient Hospital Stay (HOSPITAL_COMMUNITY)
Admission: EM | Admit: 2016-03-15 | Discharge: 2016-03-22 | DRG: 326 | Disposition: A | Discharge status: Home Health | Payer: Medicare Other | Attending: Surgery | Admitting: Surgery

## 2016-03-15 ENCOUNTER — Emergency Department (HOSPITAL_COMMUNITY): Payer: Medicare Other | Admitting: Certified Registered"

## 2016-03-15 ENCOUNTER — Encounter (HOSPITAL_COMMUNITY): Admission: EM | Disposition: A | Discharge status: Home Health | Payer: Self-pay | Source: Home / Self Care

## 2016-03-15 ENCOUNTER — Encounter (HOSPITAL_COMMUNITY): Payer: Self-pay

## 2016-03-15 DIAGNOSIS — K668 Other specified disorders of peritoneum: Secondary | ICD-10-CM | POA: Diagnosis not present

## 2016-03-15 DIAGNOSIS — I10 Essential (primary) hypertension: Secondary | ICD-10-CM | POA: Diagnosis present

## 2016-03-15 DIAGNOSIS — R1084 Generalized abdominal pain: Secondary | ICD-10-CM | POA: Diagnosis not present

## 2016-03-15 DIAGNOSIS — K631 Perforation of intestine (nontraumatic): Secondary | ICD-10-CM | POA: Diagnosis not present

## 2016-03-15 DIAGNOSIS — K2970 Gastritis, unspecified, without bleeding: Secondary | ICD-10-CM | POA: Diagnosis not present

## 2016-03-15 DIAGNOSIS — K256 Chronic or unspecified gastric ulcer with both hemorrhage and perforation: Secondary | ICD-10-CM | POA: Diagnosis not present

## 2016-03-15 DIAGNOSIS — I959 Hypotension, unspecified: Secondary | ICD-10-CM | POA: Diagnosis not present

## 2016-03-15 DIAGNOSIS — N179 Acute kidney failure, unspecified: Secondary | ICD-10-CM | POA: Diagnosis not present

## 2016-03-15 DIAGNOSIS — K255 Chronic or unspecified gastric ulcer with perforation: Secondary | ICD-10-CM | POA: Diagnosis present

## 2016-03-15 DIAGNOSIS — Z888 Allergy status to other drugs, medicaments and biological substances status: Secondary | ICD-10-CM | POA: Diagnosis not present

## 2016-03-15 DIAGNOSIS — R1030 Lower abdominal pain, unspecified: Secondary | ICD-10-CM | POA: Diagnosis not present

## 2016-03-15 DIAGNOSIS — E871 Hypo-osmolality and hyponatremia: Secondary | ICD-10-CM | POA: Diagnosis not present

## 2016-03-15 DIAGNOSIS — Z79899 Other long term (current) drug therapy: Secondary | ICD-10-CM

## 2016-03-15 DIAGNOSIS — F0390 Unspecified dementia without behavioral disturbance: Secondary | ICD-10-CM | POA: Diagnosis present

## 2016-03-15 DIAGNOSIS — L89154 Pressure ulcer of sacral region, stage 4: Secondary | ICD-10-CM | POA: Diagnosis present

## 2016-03-15 DIAGNOSIS — Z885 Allergy status to narcotic agent status: Secondary | ICD-10-CM | POA: Diagnosis not present

## 2016-03-15 DIAGNOSIS — R4182 Altered mental status, unspecified: Secondary | ICD-10-CM

## 2016-03-15 DIAGNOSIS — K251 Acute gastric ulcer with perforation: Secondary | ICD-10-CM | POA: Diagnosis not present

## 2016-03-15 DIAGNOSIS — K589 Irritable bowel syndrome without diarrhea: Secondary | ICD-10-CM | POA: Diagnosis present

## 2016-03-15 DIAGNOSIS — Z4682 Encounter for fitting and adjustment of non-vascular catheter: Secondary | ICD-10-CM | POA: Diagnosis not present

## 2016-03-15 DIAGNOSIS — R110 Nausea: Secondary | ICD-10-CM

## 2016-03-15 DIAGNOSIS — K5660 Unspecified intestinal obstruction: Secondary | ICD-10-CM | POA: Diagnosis not present

## 2016-03-15 DIAGNOSIS — K5710 Diverticulosis of small intestine without perforation or abscess without bleeding: Secondary | ICD-10-CM | POA: Diagnosis not present

## 2016-03-15 DIAGNOSIS — R748 Abnormal levels of other serum enzymes: Secondary | ICD-10-CM | POA: Diagnosis not present

## 2016-03-15 DIAGNOSIS — M1990 Unspecified osteoarthritis, unspecified site: Secondary | ICD-10-CM | POA: Diagnosis not present

## 2016-03-15 DIAGNOSIS — K259 Gastric ulcer, unspecified as acute or chronic, without hemorrhage or perforation: Secondary | ICD-10-CM

## 2016-03-15 HISTORY — PX: ABDOMINAL SURGERY: SHX537

## 2016-03-15 HISTORY — PX: LAPAROTOMY: SHX154

## 2016-03-15 LAB — CBC
HEMATOCRIT: 41.6 % (ref 36.0–46.0)
HEMOGLOBIN: 13.7 g/dL (ref 12.0–15.0)
MCH: 27.5 pg (ref 26.0–34.0)
MCHC: 32.9 g/dL (ref 30.0–36.0)
MCV: 83.4 fL (ref 78.0–100.0)
Platelets: 247 10*3/uL (ref 150–400)
RBC: 4.99 MIL/uL (ref 3.87–5.11)
RDW: 15.9 % — ABNORMAL HIGH (ref 11.5–15.5)
WBC: 5 10*3/uL (ref 4.0–10.5)

## 2016-03-15 LAB — COMPREHENSIVE METABOLIC PANEL
ALK PHOS: 93 U/L (ref 38–126)
ALT: 14 U/L (ref 14–54)
AST: 18 U/L (ref 15–41)
Albumin: 3.2 g/dL — ABNORMAL LOW (ref 3.5–5.0)
Anion gap: 12 (ref 5–15)
BILIRUBIN TOTAL: 1.1 mg/dL (ref 0.3–1.2)
BUN: 35 mg/dL — ABNORMAL HIGH (ref 6–20)
CALCIUM: 9.7 mg/dL (ref 8.9–10.3)
CO2: 20 mmol/L — ABNORMAL LOW (ref 22–32)
Chloride: 94 mmol/L — ABNORMAL LOW (ref 101–111)
Creatinine, Ser: 1.25 mg/dL — ABNORMAL HIGH (ref 0.44–1.00)
GFR, EST AFRICAN AMERICAN: 47 mL/min — AB (ref >60)
GFR, EST NON AFRICAN AMERICAN: 40 mL/min — AB (ref >60)
GLUCOSE: 197 mg/dL — AB (ref 65–99)
POTASSIUM: 5.2 mmol/L — AB (ref 3.5–5.1)
Sodium: 126 mmol/L — ABNORMAL LOW (ref 135–145)
TOTAL PROTEIN: 5.7 g/dL — AB (ref 6.5–8.1)

## 2016-03-15 LAB — I-STAT CG4 LACTIC ACID, ED: LACTIC ACID, VENOUS: 3.8 mmol/L — AB (ref 0.5–2.0)

## 2016-03-15 LAB — MAGNESIUM: MAGNESIUM: 1.7 mg/dL (ref 1.7–2.4)

## 2016-03-15 LAB — CBC WITH DIFFERENTIAL/PLATELET
Basophils Absolute: 0 10*3/uL (ref 0.0–0.1)
Basophils Relative: 0 %
EOS ABS: 0 10*3/uL (ref 0.0–0.7)
EOS PCT: 0 %
HCT: 47.6 % — ABNORMAL HIGH (ref 36.0–46.0)
Hemoglobin: 15.6 g/dL — ABNORMAL HIGH (ref 12.0–15.0)
LYMPHS ABS: 0.6 10*3/uL — AB (ref 0.7–4.0)
LYMPHS PCT: 17 %
MCH: 27.4 pg (ref 26.0–34.0)
MCHC: 32.8 g/dL (ref 30.0–36.0)
MCV: 83.7 fL (ref 78.0–100.0)
Monocytes Absolute: 0.2 10*3/uL (ref 0.1–1.0)
Monocytes Relative: 6 %
Neutro Abs: 2.9 10*3/uL (ref 1.7–7.7)
Neutrophils Relative %: 77 %
PLATELETS: 313 10*3/uL (ref 150–400)
RBC: 5.69 MIL/uL — ABNORMAL HIGH (ref 3.87–5.11)
RDW: 15.6 % — AB (ref 11.5–15.5)
WBC: 3.7 10*3/uL — AB (ref 4.0–10.5)

## 2016-03-15 LAB — BASIC METABOLIC PANEL
ANION GAP: 7 (ref 5–15)
BUN: 29 mg/dL — ABNORMAL HIGH (ref 6–20)
CHLORIDE: 102 mmol/L (ref 101–111)
CO2: 21 mmol/L — ABNORMAL LOW (ref 22–32)
Calcium: 8.8 mg/dL — ABNORMAL LOW (ref 8.9–10.3)
Creatinine, Ser: 1.01 mg/dL — ABNORMAL HIGH (ref 0.44–1.00)
GFR, EST NON AFRICAN AMERICAN: 52 mL/min — AB (ref >60)
Glucose, Bld: 124 mg/dL — ABNORMAL HIGH (ref 65–99)
Potassium: 5.3 mmol/L — ABNORMAL HIGH (ref 3.5–5.1)
SODIUM: 130 mmol/L — AB (ref 135–145)

## 2016-03-15 LAB — CREATININE, SERUM
CREATININE: 1.09 mg/dL — AB (ref 0.44–1.00)
GFR, EST AFRICAN AMERICAN: 55 mL/min — AB (ref >60)
GFR, EST NON AFRICAN AMERICAN: 48 mL/min — AB (ref >60)

## 2016-03-15 LAB — PROTIME-INR
INR: 1.13 (ref 0.00–1.49)
PROTHROMBIN TIME: 14.7 s (ref 11.6–15.2)

## 2016-03-15 LAB — MRSA PCR SCREENING: MRSA BY PCR: NEGATIVE

## 2016-03-15 LAB — LIPASE, BLOOD: LIPASE: 136 U/L — AB (ref 11–51)

## 2016-03-15 SURGERY — LAPAROTOMY, EXPLORATORY
Anesthesia: General | Site: Abdomen

## 2016-03-15 MED ORDER — ENOXAPARIN SODIUM 40 MG/0.4ML ~~LOC~~ SOLN
40.0000 mg | SUBCUTANEOUS | Status: DC
  Administered 2016-03-16: 40 mg via SUBCUTANEOUS
  Filled 2016-03-15: qty 0.4

## 2016-03-15 MED ORDER — POTASSIUM CHLORIDE IN NACL 20-0.9 MEQ/L-% IV SOLN
INTRAVENOUS | Status: DC
  Filled 2016-03-15 (×3): qty 1000

## 2016-03-15 MED ORDER — KETOROLAC TROMETHAMINE 30 MG/ML IJ SOLN
30.0000 mg | Freq: Four times a day (QID) | INTRAMUSCULAR | Status: DC | PRN
  Administered 2016-03-15: 30 mg via INTRAVENOUS
  Filled 2016-03-15: qty 1

## 2016-03-15 MED ORDER — SODIUM CHLORIDE 0.9 % IV SOLN
INTRAVENOUS | Status: DC
  Administered 2016-03-15: 100 mL/h via INTRAVENOUS
  Administered 2016-03-16 – 2016-03-17 (×2): via INTRAVENOUS

## 2016-03-15 MED ORDER — ONDANSETRON HCL 4 MG/2ML IJ SOLN: 4.0000 mg | Freq: Four times a day (QID) | INTRAMUSCULAR | Status: DC | PRN

## 2016-03-15 MED ORDER — MUPIROCIN 2 % EX OINT: 1.0000 "application " | TOPICAL_OINTMENT | Freq: Two times a day (BID) | CUTANEOUS | Status: DC

## 2016-03-15 MED ORDER — HYDRALAZINE HCL 20 MG/ML IJ SOLN: 10.0000 mg | INTRAMUSCULAR | Status: DC | PRN

## 2016-03-15 MED ORDER — PROPOFOL 10 MG/ML IV BOLUS
INTRAVENOUS | Status: AC
  Filled 2016-03-15: qty 20

## 2016-03-15 MED ORDER — PANTOPRAZOLE SODIUM 40 MG IV SOLR
40.0000 mg | Freq: Two times a day (BID) | INTRAVENOUS | Status: DC
  Administered 2016-03-15 – 2016-03-18 (×8): 40 mg via INTRAVENOUS
  Filled 2016-03-15 (×8): qty 40

## 2016-03-15 MED ORDER — LIDOCAINE HCL (CARDIAC) 20 MG/ML IV SOLN
INTRAVENOUS | Status: DC | PRN
  Administered 2016-03-15: 20 mg via INTRAVENOUS

## 2016-03-15 MED ORDER — FENTANYL CITRATE (PF) 100 MCG/2ML IJ SOLN
INTRAMUSCULAR | Status: AC
Start: 2016-03-15 — End: 2016-03-15
  Administered 2016-03-15: 25 ug via INTRAVENOUS
  Filled 2016-03-15: qty 2

## 2016-03-15 MED ORDER — PHENYLEPHRINE HCL 10 MG/ML IJ SOLN
INTRAMUSCULAR | Status: DC | PRN
  Administered 2016-03-15 (×2): 80 ug via INTRAVENOUS

## 2016-03-15 MED ORDER — MORPHINE SULFATE (PF) 2 MG/ML IV SOLN
2.0000 mg | INTRAVENOUS | Status: DC | PRN
  Filled 2016-03-15: qty 1

## 2016-03-15 MED ORDER — PIPERACILLIN-TAZOBACTAM 3.375 G IVPB
3.3750 g | Freq: Three times a day (TID) | INTRAVENOUS | Status: DC
  Administered 2016-03-15 – 2016-03-22 (×22): 3.375 g via INTRAVENOUS
  Filled 2016-03-15 (×28): qty 50

## 2016-03-15 MED ORDER — PHENYLEPHRINE HCL 10 MG/ML IJ SOLN
10.0000 mg | INTRAVENOUS | Status: DC | PRN
  Administered 2016-03-15: 40 ug/min via INTRAVENOUS

## 2016-03-15 MED ORDER — DIPHENHYDRAMINE HCL 12.5 MG/5ML PO ELIX
12.5000 mg | ORAL_SOLUTION | Freq: Four times a day (QID) | ORAL | Status: DC | PRN
  Filled 2016-03-15: qty 5

## 2016-03-15 MED ORDER — ONDANSETRON HCL 4 MG/2ML IJ SOLN
INTRAMUSCULAR | Status: AC
  Filled 2016-03-15: qty 2

## 2016-03-15 MED ORDER — ONDANSETRON 4 MG PO TBDP
4.0000 mg | ORAL_TABLET | Freq: Four times a day (QID) | ORAL | Status: DC | PRN
  Filled 2016-03-15: qty 1

## 2016-03-15 MED ORDER — SODIUM CHLORIDE 0.9 % IV SOLN
INTRAVENOUS | Status: DC | PRN
  Administered 2016-03-15: 04:00:00 via INTRAVENOUS

## 2016-03-15 MED ORDER — PROPOFOL 10 MG/ML IV BOLUS
INTRAVENOUS | Status: DC | PRN
  Administered 2016-03-15: 90 mg via INTRAVENOUS

## 2016-03-15 MED ORDER — CHLORHEXIDINE GLUCONATE CLOTH 2 % EX PADS
6.0000 | MEDICATED_PAD | Freq: Every day | CUTANEOUS | Status: AC
  Administered 2016-03-15 – 2016-03-18 (×4): 6 via TOPICAL

## 2016-03-15 MED ORDER — SODIUM CHLORIDE 0.9 % IV BOLUS (SEPSIS)
1000.0000 mL | Freq: Once | INTRAVENOUS | Status: AC
  Administered 2016-03-15: 1000 mL via INTRAVENOUS

## 2016-03-15 MED ORDER — FENTANYL CITRATE (PF) 100 MCG/2ML IJ SOLN
25.0000 ug | INTRAMUSCULAR | Status: DC | PRN
  Administered 2016-03-15 (×3): 25 ug via INTRAVENOUS

## 2016-03-15 MED ORDER — ALBUMIN HUMAN 5 % IV SOLN
INTRAVENOUS | Status: DC | PRN
  Administered 2016-03-15: 05:00:00 via INTRAVENOUS

## 2016-03-15 MED ORDER — FENTANYL CITRATE (PF) 250 MCG/5ML IJ SOLN
INTRAMUSCULAR | Status: AC
  Filled 2016-03-15: qty 5

## 2016-03-15 MED ORDER — SUCCINYLCHOLINE CHLORIDE 20 MG/ML IJ SOLN
INTRAMUSCULAR | Status: DC | PRN
  Administered 2016-03-15: 100 mg via INTRAVENOUS

## 2016-03-15 MED ORDER — 0.9 % SODIUM CHLORIDE (POUR BTL) OPTIME
TOPICAL | Status: DC | PRN
  Administered 2016-03-15: 4000 mL

## 2016-03-15 MED ORDER — FENTANYL CITRATE (PF) 100 MCG/2ML IJ SOLN
INTRAMUSCULAR | Status: DC | PRN
  Administered 2016-03-15: 100 ug via INTRAVENOUS

## 2016-03-15 MED ORDER — DIPHENHYDRAMINE HCL 50 MG/ML IJ SOLN
12.5000 mg | Freq: Four times a day (QID) | INTRAMUSCULAR | Status: DC | PRN
  Administered 2016-03-16 – 2016-03-18 (×2): 12.5 mg via INTRAVENOUS
  Filled 2016-03-15 (×2): qty 1

## 2016-03-15 SURGICAL SUPPLY — 41 items
BLADE SURG ROTATE 9660 (MISCELLANEOUS) IMPLANT
CANISTER SUCTION 2500CC (MISCELLANEOUS) ×3 IMPLANT
CHLORAPREP W/TINT 26ML (MISCELLANEOUS) ×3 IMPLANT
COVER SURGICAL LIGHT HANDLE (MISCELLANEOUS) ×3 IMPLANT
DRAPE LAPAROSCOPIC ABDOMINAL (DRAPES) ×3 IMPLANT
DRAPE WARM FLUID 44X44 (DRAPE) ×3 IMPLANT
DRSG OPSITE POSTOP 4X10 (GAUZE/BANDAGES/DRESSINGS) IMPLANT
DRSG OPSITE POSTOP 4X8 (GAUZE/BANDAGES/DRESSINGS) IMPLANT
DRSG PAD ABDOMINAL 8X10 ST (GAUZE/BANDAGES/DRESSINGS) ×3 IMPLANT
ELECT BLADE 6.5 EXT (BLADE) IMPLANT
ELECT CAUTERY BLADE 6.4 (BLADE) ×6 IMPLANT
ELECT REM PT RETURN 9FT ADLT (ELECTROSURGICAL) ×3
ELECTRODE REM PT RTRN 9FT ADLT (ELECTROSURGICAL) ×1 IMPLANT
GLOVE BIO SURGEON STRL SZ7 (GLOVE) ×3 IMPLANT
GLOVE BIO SURGEON STRL SZ8 (GLOVE) ×15 IMPLANT
GLOVE BIOGEL PI IND STRL 7.5 (GLOVE) ×1 IMPLANT
GLOVE BIOGEL PI INDICATOR 7.5 (GLOVE) ×2
GOWN STRL REUS W/ TWL LRG LVL3 (GOWN DISPOSABLE) ×2 IMPLANT
GOWN STRL REUS W/TWL LRG LVL3 (GOWN DISPOSABLE) ×4
KIT BASIN OR (CUSTOM PROCEDURE TRAY) ×3 IMPLANT
KIT ROOM TURNOVER OR (KITS) ×3 IMPLANT
LIGASURE IMPACT 36 18CM CVD LR (INSTRUMENTS) IMPLANT
NS IRRIG 1000ML POUR BTL (IV SOLUTION) ×6 IMPLANT
PACK GENERAL/GYN (CUSTOM PROCEDURE TRAY) ×3 IMPLANT
PAD ARMBOARD 7.5X6 YLW CONV (MISCELLANEOUS) ×3 IMPLANT
SPECIMEN JAR LARGE (MISCELLANEOUS) IMPLANT
SPONGE GAUZE 4X4 12PLY STER LF (GAUZE/BANDAGES/DRESSINGS) ×3 IMPLANT
SPONGE LAP 18X18 X RAY DECT (DISPOSABLE) IMPLANT
STAPLER VISISTAT 35W (STAPLE) ×3 IMPLANT
SUCTION POOLE TIP (SUCTIONS) ×3 IMPLANT
SUT PDS AB 1 TP1 54 (SUTURE) ×6 IMPLANT
SUT PDS AB 1 TP1 96 (SUTURE) ×6 IMPLANT
SUT SILK 2 0 SH CR/8 (SUTURE) ×3 IMPLANT
SUT SILK 2 0 TIES 10X30 (SUTURE) ×3 IMPLANT
SUT SILK 3 0 SH CR/8 (SUTURE) ×3 IMPLANT
SUT SILK 3 0 TIES 10X30 (SUTURE) ×3 IMPLANT
SUT VIC AB 3-0 SH 18 (SUTURE) IMPLANT
TAPE CLOTH SURG 6X10 WHT LF (GAUZE/BANDAGES/DRESSINGS) ×3 IMPLANT
TOWEL OR 17X26 10 PK STRL BLUE (TOWEL DISPOSABLE) ×3 IMPLANT
TRAY FOLEY CATH 16FRSI W/METER (SET/KITS/TRAYS/PACK) IMPLANT
YANKAUER SUCT BULB TIP NO VENT (SUCTIONS) IMPLANT

## 2016-03-15 NOTE — Anesthesia Procedure Notes (Signed)
Procedure Name: Intubation Date/Time: 03/15/2016 4:37 AM Performed by: Manuela Schwartz B Pre-anesthesia Checklist: Patient identified, Emergency Drugs available, Suction available, Patient being monitored and Timeout performed Patient Re-evaluated:Patient Re-evaluated prior to inductionOxygen Delivery Method: Circle system utilized Preoxygenation: Pre-oxygenation with 100% oxygen Intubation Type: IV induction and Rapid sequence Laryngoscope Size: Mac and 3 Grade View: Grade I Tube type: Subglottic suction tube Tube size: 7.5 mm Number of attempts: 1 Airway Equipment and Method: Stylet Placement Confirmation: ETT inserted through vocal cords under direct vision,  positive ETCO2 and breath sounds checked- equal and bilateral Secured at: 21 cm Tube secured with: Tape Dental Injury: Teeth and Oropharynx as per pre-operative assessment

## 2016-03-15 NOTE — ED Notes (Signed)
Per EMS: Pt from [facility]. Dx with bowel perf. Pt complaining of nausea and vomiting. Hx Stage 4 pressure ulcer to buttock. BP = 142/50, 85bpm, 96% on room air.

## 2016-03-15 NOTE — ED Provider Notes (Signed)
CSN: [id number]     Arrival date & time 03/15/16  0254 History   First MD Initiated Contact with Patient 03/15/16 0258     Chief Complaint  Patient presents with  . Abdominal Pain     (Consider location/radiation/quality/duration/timing/severity/associated sxs/prior Treatment) HPI Comments: Patient presents to the emergency department as a transfer from [facility]. Patient was seen for nausea, vomiting and abdominal discomfort. She had CT scan performed that showed free air in her abdomen consistent with perforated viscus. Patient was accepted for transfer by Dr. Georgette Dover anticipating exploratory laparotomy.  At arrival patient is not complaining of significant pain. She reports that she has had nausea, vomiting and poor oral intake for some time. She is very weakened. She lives alone.  Patient is a 80 y.o. female presenting with abdominal pain.  Abdominal Pain Associated symptoms: nausea and vomiting     Past Medical History  Diagnosis Date  . Hypertension   . Irritable bowel syndrome   . Arthritis    Past Surgical History  Procedure Laterality Date  . Abdominal hysterectomy    . Appendectomy    . Thyroidectomy    . Breast biopsy Left   . Cataract extraction w/phaco Right 11/12/2015    Procedure: CATARACT EXTRACTION PHACO AND INTRAOCULAR LENS PLACEMENT RIGHT EYE;  Surgeon: Tonny Branch, MD;  Location: [facility];  Service: Ophthalmology;  Laterality: Right;  CDE 7.40  . Cataract extraction w/phaco Left 12/28/2015    Procedure: CATARACT EXTRACTION PHACO AND INTRAOCULAR LENS PLACEMENT LEFT EYE CDE=4.79;  Surgeon: Tonny Branch, MD;  Location: [facility];  Service: Ophthalmology;  Laterality: Left;   Family History  Problem Relation Age of Onset  . Heart disease Mother   . Diabetes Father    Social History  Substance Use Topics  . Smoking status: Never Smoker   . Smokeless tobacco: Never Used  . Alcohol Use: No   OB History    No data available     Review of Systems   Gastrointestinal: Positive for nausea, vomiting and abdominal pain.  All other systems reviewed and are negative.     Allergies  Bextra; Codeine; Detrol la; Lipitor; and Prednisone  Home Medications   Prior to Admission medications   Medication Sig Start Date End Date Taking? Authorizing Provider  amLODipine (NORVASC) 5 MG tablet TAKE 1 TABLET (5 MG TOTAL) BY MOUTH DAILY. 02/12/16   Claretta Fraise, MD  calcium carbonate (OS-CAL) 600 MG TABS tablet Take 600 mg by mouth daily with breakfast.    Historical Provider, MD  donepezil (ARICEPT) 5 MG tablet Take 1 tablet (5 mg total) by mouth at bedtime. 03/07/16   Claretta Fraise, MD  doxycycline (VIBRAMYCIN) 100 MG capsule Take 1 capsule (100 mg total) by mouth 2 (two) times daily. Patient not taking: Reported on 03/07/2016 02/23/16   Isla Pence, MD  fluticasone [facility]) 50 MCG/ACT nasal spray Place 2 sprays into both nostrils daily. 07/01/15   Timmothy Euler, MD  ibuprofen (ADVIL,MOTRIN) 200 MG tablet Take 200-400 mg by mouth every 6 (six) hours as needed for moderate pain.    Historical Provider, MD  meloxicam (MOBIC) 15 MG tablet Take 1 tablet (15 mg total) by mouth daily. For joint and muscle pain Patient taking differently: Take 15 mg by mouth 2 (two) times daily. For joint and muscle pain 12/10/15   Claretta Fraise, MD  minocycline (MINOCIN) 100 MG capsule Take 1 capsule (100 mg total) by mouth 2 (two) times daily. For ten days Patient not  taking: Reported on 03/07/2016 12/10/15   Claretta Fraise, MD  Misc Natural Products (OSTEO BI-FLEX JOINT SHIELD PO) Apply 1 application topically as needed (pain).     Historical Provider, MD  Nutritional Supplements (ENSURE HIGH PROTEIN) LIQD Drink 2 -4 cans daily 03/08/16   Claretta Fraise, MD  Omega-3 Fatty Acids (FISH OIL) 1200 MG CAPS Take 1 capsule by mouth 2 (two) times daily.    Historical Provider, MD   BP 107/67 mmHg  Pulse 83  Temp(Src) 97.6 F (36.4 C) (Oral)  Resp 21  SpO2 100% Physical Exam   Constitutional: She is oriented to person, place, and time. No distress.  HENT:  Head: Normocephalic and atraumatic.  Right Ear: Hearing normal.  Left Ear: Hearing normal.  Nose: Nose normal.  Mouth/Throat: Oropharynx is clear and moist and mucous membranes are normal.  Eyes: Conjunctivae and EOM are normal. Pupils are equal, round, and reactive to light.  Neck: Normal range of motion. Neck supple.  Cardiovascular: Regular rhythm, S1 normal and S2 normal.  Exam reveals no gallop and no friction rub.   No murmur heard. Pulmonary/Chest: Effort normal and breath sounds normal. No respiratory distress. She exhibits no tenderness.  Abdominal: Soft. Normal appearance and bowel sounds are normal. She exhibits distension. There is no hepatosplenomegaly. There is generalized tenderness. There is no rebound, no guarding, no tenderness at McBurney's point and negative Murphy's sign. No hernia.  Musculoskeletal: Normal range of motion.  Neurological: She is alert and oriented to person, place, and time. She has normal strength. No cranial nerve deficit or sensory deficit. Coordination normal. GCS eye subscore is 4. GCS verbal subscore is 5. GCS motor subscore is 6.  Skin: Skin is warm, dry and intact. No rash noted. No cyanosis.  Psychiatric: She has a normal mood and affect. Her speech is normal and behavior is normal. Thought content normal.  Nursing note and vitals reviewed.   ED Course  Procedures (including critical care time) Labs Review Labs Reviewed  CBC WITH DIFFERENTIAL/PLATELET - Abnormal; Notable for the following:    WBC 3.7 (*)    RBC 5.69 (*)    Hemoglobin 15.6 (*)    HCT 47.6 (*)    RDW 15.6 (*)    Lymphs Abs 0.6 (*)    All other components within normal limits  I-STAT CG4 LACTIC ACID, ED - Abnormal; Notable for the following:    Lactic Acid, Venous 3.80 (*)    All other components within normal limits  COMPREHENSIVE METABOLIC PANEL  LIPASE, BLOOD  PROTIME-INR    Imaging  Review No results found. I have personally reviewed and evaluated these images and lab results as part of my medical decision-making.   EKG Interpretation   Date/Time:  Tuesday Mar 15 2016 03:02:04 EDT Ventricular Rate:  85 PR Interval:  132 QRS Duration: 83 QT Interval:  365 QTC Calculation: 434 R Axis:   6 Text Interpretation:  Sinus rhythm Low voltage, extremity and precordial  leads Probable anteroseptal infarct, old Baseline wander in lead(s) V5 No  significant change since last tracing Confirmed by POLLINA  MD,  [place] [phone]) on 03/15/2016 3:56:19 AM      MDM   Final diagnoses:  Pneumoperitoneum    Patient transferred from [facility] for surgical treatment of pneumoperitoneum. Patient is stable at arrival. Labs are reviewed she does have a leukocytosis. Patient also had very slightly elevated potassium, will recheck. Dr. Georgette Dover present in the ER, has evaluated the patient and will take patient  to operating room.    Orpah Greek, MD 03/15/16 [phone]

## 2016-03-15 NOTE — Significant Event (Signed)
Spoke with Estill Bakes [place] regarding latest BMP per verbal order from Dr. Redmond Pulling who told RN to call PA with BMP results. Per Emina, to change fluids to NS at same rate (100cc/hour) and for BMP tomorrow. Zymiere Trostle, Therapist, sports.

## 2016-03-15 NOTE — Consult Note (Addendum)
WOC wound consult note Reason for Consult: Consult requested for sacrum.  Family at bedside states this is a chronic stage 4 pressure injury which previously had exposed bone and has greatly improved.  She is followed by the outpatient wound care center in [place] and has been using a collagen dressing.  This topical treatment is not available in the [place]; discussed that we will substitute Silver Hydrofiber while in the hospital since this is a similar product, and they verbalize understanding. Wound type: Chronic stage 4 pressure injury Pressure Ulcer POA: Yes Measurement: 2X1X.2cm Wound bed: pink and moist Drainage (amount, consistency, odor) small amt tan drainage, no odor Periwound: Intact skin surrounding Dressing procedure/placement/frequency: Aquacel to promote healing, foam dressing to protect from further injury; pt can resume follow-up at the outpatient wound care center after discharge. Please re-consult if further assistance is needed.  Thank-you,  Julien Girt MSN, [place], [place], [place], [place]

## 2016-03-15 NOTE — Transfer of Care (Signed)
Immediate Anesthesia Transfer of Care Note  Patient: Sandra Hamilton  Procedure(s) Performed: Procedure(s): EXPLORATORY LAPAROTOMY, CLOSURE OF PYLORIC ULCER  (N/A)  Patient Location: PACU  Anesthesia Type:General  Level of Consciousness: awake, alert  and oriented  Airway & Oxygen Therapy: Patient Spontanous Breathing and Patient connected to nasal cannula oxygen  Post-op Assessment: Report given to RN and Post -op Vital signs reviewed and stable  Post vital signs: Reviewed and stable  Last Vitals:  Filed Vitals:   03/15/16 0350 03/15/16 0400  BP: 72/59 123/68  Pulse: 81 83  Temp:    Resp: 23 22    Last Pain: There were no vitals filed for this visit.       Complications: No apparent anesthesia complications

## 2016-03-15 NOTE — Progress Notes (Signed)
Notified MD Rosendo Gros about patient's stated allergy to morphine. Rosendo Gros requested to put in an order for [employer]. Spoke with pharmacy and confirmed it is okay to give medication despite patient's reaction to Bextra. Will continue to monitor patient.  Levon Hedger RN 5:35 PM 03/15/2016

## 2016-03-15 NOTE — Care Management Note (Signed)
Case Management Note  Patient Details  Name: Sandra Hamilton MRN: [id number] Date of Birth: 05-26-1936  Subjective/Objective:         Pt admitted with abdominal pain           Action/Plan:  Pt is from home alone with children close by.  Pt is already active with [facility] for [facility] PT and RN (stage 4 sacral wound).  CM ordered PT/OT consult.  CM will continue to monitor for disposition needs   Expected Discharge Date:   (pending)               Expected Discharge Plan:  [place]  In-House Referral:     Discharge planning Services  CM Consult  Post Acute Care Choice:    Choice offered to:     DME Arranged:    DME Agency:     HH Arranged:    [place] Agency:     Status of Service:  In process, will continue to follow  Medicare Important Message Given:    Date Medicare IM Given:    Medicare IM give by:    Date Additional Medicare IM Given:    Additional Medicare Important Message give by:     If discussed at [place] of Stay Meetings, dates discussed:    Additional Comments:  Maryclare Labrador, RN 03/15/2016, 2:24 PM

## 2016-03-15 NOTE — Op Note (Signed)
Surgeon: Kaylyn Lim, MD, FACS  Asst:  Verita Lamb, MD, FACS  Anes:  general  Procedure: Exploratory laparotomy, closure of perforated ulcer with Phillip Heal patch  Diagnosis: Perforated gastric ulcer  Complications: none  EBL:   minimal cc  Drains: none  Description of Procedure:  The patient was taken to OR 2 at [facility] as an early AM emergency case.  After anesthesia was administered and the patient was prepped a timeout was performed.  An upper midline incision was used to gain access to the abdomen.  Pneumoperitoneum and copious yellow fluid consistent with bile and succus entericus were aspirated from the abdominal compartments.  The antrum was grasped and a 1 cm perforation of the pyloric region was found.  This was closed primarily with 2-0 silk sutures (6) and then a piece of omentum was placed upon this and was tied down over the closure.  An NG tube was placed and was in good position.  The abdominal compartments were once again irrigated and then closure was with a # 1 PDS and packing of the wound.    The patient tolerated the procedure well and was taken to the PACU in stable condition.     Matt B. Hassell Done, [place], [facility], [place]

## 2016-03-15 NOTE — Anesthesia Preprocedure Evaluation (Addendum)
Anesthesia Evaluation  Patient identified by MRN, date of birth, ID band Patient awake    Reviewed: Allergy & Precautions, NPO status , Patient's Chart, lab work & pertinent test results  History of Anesthesia Complications Negative for: history of anesthetic complications  Airway Mallampati: I  TM Distance: >3 FB Neck ROM: Full    Dental  (+) Dental Advisory Given, Caps, Chipped   Pulmonary neg pulmonary ROS,    breath sounds clear to auscultation       Cardiovascular hypertension, Pt. on medications (-) angina Rhythm:Regular Rate:Normal     Neuro/Psych PSYCHIATRIC DISORDERS (dementia) negative neurological ROS     GI/Hepatic Neg liver ROS, PUD, Nausea and vomiting with free air in abdomen   Endo/Other  negative endocrine ROS  Renal/GU negative Renal ROS     Musculoskeletal  (+) Arthritis , Osteoarthritis,    Abdominal   Peds  Hematology negative hematology ROS (+)   Anesthesia Other Findings   Reproductive/Obstetrics                            Anesthesia Physical Anesthesia Plan  ASA: III and emergent  Anesthesia Plan: General   Post-op Pain Management:    Induction: Intravenous and Rapid sequence  Airway Management Planned: Oral ETT  Additional Equipment:   Intra-op Plan:   Post-operative Plan: Possible Post-op intubation/ventilation  Informed Consent: I have reviewed the patients History and Physical, chart, labs and discussed the procedure including the risks, benefits and alternatives for the proposed anesthesia with the patient or authorized representative who has indicated his/her understanding and acceptance.   Dental advisory given  Plan Discussed with: CRNA and Surgeon  Anesthesia Plan Comments: (Plan routine monitors, GETA with possible post op ventilation)        Anesthesia Quick Evaluation

## 2016-03-15 NOTE — Anesthesia Postprocedure Evaluation (Signed)
Anesthesia Post Note  Patient: Sandra Hamilton  Procedure(s) Performed: Procedure(s) (LRB): EXPLORATORY LAPAROTOMY, CLOSURE OF PYLORIC ULCER  (N/A)  Patient location during evaluation: PACU Anesthesia Type: General Level of consciousness: sedated, oriented and patient cooperative Pain management: pain level controlled Vital Signs Assessment: post-procedure vital signs reviewed and stable Respiratory status: spontaneous breathing, nonlabored ventilation, respiratory function stable and patient connected to nasal cannula oxygen Cardiovascular status: blood pressure returned to baseline and stable Postop Assessment: no signs of nausea or vomiting Anesthetic complications: no    Last Vitals:  Filed Vitals:   03/15/16 0641 03/15/16 0645  BP: 117/66 121/61  Pulse: 86 85  Temp:  36.4 C  Resp: 16 15    Last Pain:  Filed Vitals:   03/15/16 0651  PainSc: Asleep                 Macen Joslin,E. Valia Wingard

## 2016-03-15 NOTE — H&P (Signed)
Sandra Hamilton is an 80 y.o. female.   Chief Complaint: Abdominal pain HPI: This is a frail 80 yo female who presents with one month of gradual weakness and difficulty with PO intake.  She states that whenever she tried to eat, she would throw up.  She presented to [place] with severe abdominal pain and increasing weakness.  She was found to have free air consistent with perforated gastric ulcer.  The surgeon covering [facility] (Dr. Donne Hazel) was called and he recommended transfer.  She was given Invanz prior to transfer.  She was recently found to have a decubitus ulcer and is being treated by the wound care center at [facility].  The patient wants to be a full code.  Past Medical History  Diagnosis Date  . Hypertension   . Irritable bowel syndrome   . Arthritis   Dementia  Past Surgical History  Procedure Laterality Date  . Abdominal hysterectomy    . Appendectomy    . Thyroidectomy    . Breast biopsy Left   . Cataract extraction w/phaco Right 11/12/2015    Procedure: CATARACT EXTRACTION PHACO AND INTRAOCULAR LENS PLACEMENT RIGHT EYE;  Surgeon: Tonny Branch, MD;  Location: [facility];  Service: Ophthalmology;  Laterality: Right;  CDE 7.40  . Cataract extraction w/phaco Left 12/28/2015    Procedure: CATARACT EXTRACTION PHACO AND INTRAOCULAR LENS PLACEMENT LEFT EYE CDE=4.79;  Surgeon: Tonny Branch, MD;  Location: [facility];  Service: Ophthalmology;  Laterality: Left;    Family History  Problem Relation Age of Onset  . Heart disease Mother   . Diabetes Father    Social History:  reports that she has never smoked. She has never used smokeless tobacco. She reports that she does not drink alcohol or use illicit drugs. The patient lives alone  Allergies:  Allergies  Allergen Reactions  . Bextra [Valdecoxib] Shortness Of Breath  . Codeine Other (See Comments)    HEADACHE  . Detrol La [Tolterodine Tartrate Er]     Dizziness   . Lipitor [Atorvastatin] Other (See Comments)   REACTION IS UNKNOWN  . Prednisone Other (See Comments)    REACTION IS UNKNOWN    Prior to Admission medications   Medication Sig Start Date End Date Taking? Authorizing Provider  amLODipine (NORVASC) 5 MG tablet TAKE 1 TABLET (5 MG TOTAL) BY MOUTH DAILY. 02/12/16   Claretta Fraise, MD  calcium carbonate (OS-CAL) 600 MG TABS tablet Take 600 mg by mouth daily with breakfast.    Historical Provider, MD  donepezil (ARICEPT) 5 MG tablet Take 1 tablet (5 mg total) by mouth at bedtime. 03/07/16   Claretta Fraise, MD  doxycycline (VIBRAMYCIN) 100 MG capsule Take 1 capsule (100 mg total) by mouth 2 (two) times daily. Patient not taking: Reported on 03/07/2016 02/23/16   Isla Pence, MD  fluticasone [facility]) 50 MCG/ACT nasal spray Place 2 sprays into both nostrils daily. 07/01/15   Timmothy Euler, MD  ibuprofen (ADVIL,MOTRIN) 200 MG tablet Take 200-400 mg by mouth every 6 (six) hours as needed for moderate pain.    Historical Provider, MD  meloxicam (MOBIC) 15 MG tablet Take 1 tablet (15 mg total) by mouth daily. For joint and muscle pain Patient taking differently: Take 15 mg by mouth 2 (two) times daily. For joint and muscle pain 12/10/15   Claretta Fraise, MD  minocycline (MINOCIN) 100 MG capsule Take 1 capsule (100 mg total) by mouth 2 (two) times daily. For ten days Patient not taking: Reported on 03/07/2016 12/10/15  Claretta Fraise, MD  Misc Natural Products (OSTEO BI-FLEX JOINT SHIELD PO) Apply 1 application topically as needed (pain).     Historical Provider, MD  Nutritional Supplements (ENSURE HIGH PROTEIN) LIQD Drink 2 -4 cans daily 03/08/16   Claretta Fraise, MD  Omega-3 Fatty Acids (FISH OIL) 1200 MG CAPS Take 1 capsule by mouth 2 (two) times daily.    Historical Provider, MD     Results for orders placed or performed during the hospital encounter of 03/15/16 (from the past 48 hour(s))  I-Stat CG4 Lactic Acid, ED     Status: Abnormal   Collection Time: 03/15/16  3:28 AM  Result Value Ref Range    Lactic Acid, Venous 3.80 (HH) 0.5 - 2.0 mmol/L   Comment NOTIFIED PHYSICIAN    Lab Results  Component Value Date   WBC 3.7* 03/15/2016   HGB 15.6* 03/15/2016   HCT 47.6* 03/15/2016   MCV 83.7 03/15/2016   PLT 313 03/15/2016   Sodium - 19 K 5.3 Cl 89 CO2 22 BUN 36 Creatinine 1.09  CT abd/ pelvis - bowel perforation with large volume free air, free fluid, and oral contrast throughout the abdomen and pelvic cavity.  The perforation appears to be within the stomach with contrast extending across a peripyloric defect in the RUQ.  Marked thickening of the duodenum and proximal jejunum.  Incidental cholelithiasis.  ROS  Blood pressure 107/67, pulse 83, temperature 97.6 F (36.4 C), temperature source Oral, resp. rate 21, SpO2 100 %. Physical Exam  Frail elderly female - NAD HEENT:  EOMI, sclera anicteric Neck:  No masses, no thyromegaly Lungs:  CTA bilaterally; normal respiratory effort CV:  Regular rate and rhythm; no murmurs Abd:  Soft; diffusely tender, especially in the RUQ; mildly tender in LUQ, LLQ Ext:  Well-perfused; no edema Skin:  Warm, dry; no sign of jaundice.  Packed sacral decubitus ulcer.   Assessment/Plan Perforated viscus - likely perforated gastric ulcer Early sepsis  To OR immediately for exploratory laparotomy/ repair of gastric ulcer ICU post-op   Shakeem Stern K., MD 03/15/2016, 3:52 AM

## 2016-03-16 ENCOUNTER — Inpatient Hospital Stay (HOSPITAL_COMMUNITY): Payer: Medicare Other

## 2016-03-16 LAB — BASIC METABOLIC PANEL
ANION GAP: 7 (ref 5–15)
BUN: 32 mg/dL — ABNORMAL HIGH (ref 6–20)
CO2: 21 mmol/L — ABNORMAL LOW (ref 22–32)
Calcium: 8.5 mg/dL — ABNORMAL LOW (ref 8.9–10.3)
Chloride: 104 mmol/L (ref 101–111)
Creatinine, Ser: 1.34 mg/dL — ABNORMAL HIGH (ref 0.44–1.00)
GFR calc Af Amer: 43 mL/min — ABNORMAL LOW (ref >60)
GFR, EST NON AFRICAN AMERICAN: 37 mL/min — AB (ref >60)
Glucose, Bld: 90 mg/dL (ref 65–99)
POTASSIUM: 4.8 mmol/L (ref 3.5–5.1)
SODIUM: 132 mmol/L — AB (ref 135–145)

## 2016-03-16 LAB — CBC
HCT: 35.6 % — ABNORMAL LOW (ref 36.0–46.0)
Hemoglobin: 11.2 g/dL — ABNORMAL LOW (ref 12.0–15.0)
MCH: 26.1 pg (ref 26.0–34.0)
MCHC: 31.5 g/dL (ref 30.0–36.0)
MCV: 83 fL (ref 78.0–100.0)
Platelets: 219 10*3/uL (ref 150–400)
RBC: 4.29 MIL/uL (ref 3.87–5.11)
RDW: 16.2 % — ABNORMAL HIGH (ref 11.5–15.5)
WBC: 7.7 10*3/uL (ref 4.0–10.5)

## 2016-03-16 MED ORDER — SODIUM CHLORIDE 0.9 % IV BOLUS (SEPSIS)
500.0000 mL | Freq: Once | INTRAVENOUS | Status: AC
  Administered 2016-03-16: 500 mL via INTRAVENOUS

## 2016-03-16 MED ORDER — CHLORHEXIDINE GLUCONATE 0.12 % MT SOLN
15.0000 mL | Freq: Two times a day (BID) | OROMUCOSAL | Status: DC
  Administered 2016-03-16 (×2): 15 mL via OROMUCOSAL
  Filled 2016-03-16: qty 15

## 2016-03-16 MED ORDER — FENTANYL CITRATE (PF) 100 MCG/2ML IJ SOLN
12.5000 ug | INTRAMUSCULAR | Status: DC | PRN
  Administered 2016-03-18: 50 ug via INTRAVENOUS
  Filled 2016-03-16: qty 2

## 2016-03-16 MED ORDER — ACETAMINOPHEN 10 MG/ML IV SOLN
1000.0000 mg | Freq: Four times a day (QID) | INTRAVENOUS | Status: AC
  Administered 2016-03-16 – 2016-03-17 (×3): 1000 mg via INTRAVENOUS
  Filled 2016-03-16 (×4): qty 100

## 2016-03-16 MED ORDER — CETYLPYRIDINIUM CHLORIDE 0.05 % MT LIQD: 7.0000 mL | Freq: Two times a day (BID) | OROMUCOSAL | Status: DC

## 2016-03-16 MED ORDER — ENOXAPARIN SODIUM 30 MG/0.3ML ~~LOC~~ SOLN
30.0000 mg | SUBCUTANEOUS | Status: DC
  Administered 2016-03-17 – 2016-03-22 (×6): 30 mg via SUBCUTANEOUS
  Filled 2016-03-16 (×6): qty 0.3

## 2016-03-16 NOTE — Evaluation (Signed)
Physical Therapy Evaluation Patient Details Name: Sandra Hamilton MRN: [id number] DOB: 1936/05/18 Today's Date: 03/16/2016   History of Present Illness  This is a frail 80 yo female who presents with one month of gradual weakness and difficulty with PO intake. She states that whenever she tried to eat, she would throw up. She presented to [place] with severe abdominal pain and increasing weakness. She was found to have free air consistent with perforated gastric ulcer. The surgeon covering [place] (Dr. Donne Hazel) was called and he recommended transfer.Closure of perforated ulcer surgically.   Clinical Impression  Pt admitted with above diagnosis. Pt currently with functional limitations due to the deficits listed below (see PT Problem List). Pt was able to ambulate on unit with RW with cues for safety. Will most likely need 24 hour care initially.  Pt confused so will need to clarify family support. Will follow acutely.  Pt will benefit from skilled PT to increase their independence and safety with mobility to allow discharge to the venue listed below.      Follow Up Recommendations Home health PT;Supervision/Assistance - 24 hour (if no 24 hours, may need SNF)    Equipment Recommendations  Rolling walker with 5" wheels;3in1 (PT)    Recommendations for Other Services       Precautions / Restrictions Precautions Precautions: Fall Restrictions Weight Bearing Restrictions: No      Mobility  Bed Mobility Overal bed mobility: Needs Assistance Bed Mobility: Supine to Sit     Supine to sit: Mod assist     General bed mobility comments: needed assist for LEs and elevation of trunk.  Transfers Overall transfer level: Needs assistance Equipment used: Rolling walker (2 wheeled) Transfers: Sit to/from Stand Sit to Stand: Min assist         General transfer comment: Needed cues for hand placement.  Ambulation/Gait Ambulation/Gait assistance: Min  assist Ambulation Distance (Feet): 165 Feet Assistive device: Rolling walker (2 wheeled) Gait Pattern/deviations: Step-through pattern;Decreased stride length;Drifts right/left   Gait velocity interpretation: Below normal speed for age/gender General Gait Details: Pt needed cues for sequencing steps and RW.  Pt had difficulty with turns needing help with steering RW and staying close to RW.  Difficulty maneuvering around obstacles as well.   Stairs            Wheelchair Mobility    Modified Rankin (Stroke Patients Only)       Balance Overall balance assessment: Needs assistance Sitting-balance support: Feet supported;Bilateral upper extremity supported Sitting balance-Leahy Scale: Poor Sitting balance - Comments: needed min assist to sit EOB   Standing balance support: Bilateral upper extremity supported;During functional activity Standing balance-Leahy Scale: Poor Standing balance comment: relies on UE support onRW              High level balance activites: Direction changes;Turns;Sudden stops High Level Balance Comments: needed min assist for stability              Pertinent Vitals/Pain Pain Assessment: 0-10 Pain Score: 10-Worst pain ever Pain Location: with movement Pain Descriptors / Indicators: Aching Pain Intervention(s): Limited activity within patient's tolerance;Monitored during session;Premedicated before session;Repositioned  VSS    Home Living Family/patient expects to be discharged to:: Private residence Living Arrangements: Alone Available Help at Discharge: Family;Available PRN/intermittently (lives close by and are retired) Type of Home: House Home Access: Stairs to enter Entrance Stairs-Rails: None Technical brewer of Steps: 2 Home Layout: One level   Additional Comments: unsure - pt confused.  Prior Function Level of Independence: Independent               Hand Dominance        Extremity/Trunk Assessment   Upper  Extremity Assessment: Defer to OT evaluation           Lower Extremity Assessment: Generalized weakness      Cervical / Trunk Assessment: Normal  Communication   Communication: No difficulties  Cognition Arousal/Alertness: Awake/alert Behavior During Therapy: Flat affect Overall Cognitive Status: History of cognitive impairments - at baseline                      General Comments      Exercises        Assessment/Plan    PT Assessment Patient needs continued PT services  PT Diagnosis Generalized weakness   PT Problem List Decreased activity tolerance;Decreased balance;Decreased mobility;Decreased knowledge of use of DME;Decreased safety awareness;Decreased knowledge of precautions;Decreased cognition  PT Treatment Interventions DME instruction;Gait training;Functional mobility training;Therapeutic activities;Therapeutic exercise;Balance training;Patient/family education;Stair training   PT Goals (Current goals can be found in the Care Plan section) Acute Rehab PT Goals Patient Stated Goal: to get better PT Goal Formulation: With patient Time For Goal Achievement: 03/30/16 Potential to Achieve Goals: Good    Frequency Min 3X/week   Barriers to discharge Decreased caregiver support      Co-evaluation               End of Session Equipment Utilized During Treatment: Gait belt Activity Tolerance: Patient limited by fatigue Patient left: in chair;with call bell/phone within reach;with chair alarm set;with nursing/sitter in room Nurse Communication: Mobility status         Time: [id number] PT Time Calculation (min) (ACUTE ONLY): 19 min   Charges:   PT Evaluation $PT Eval Moderate Complexity: 1 Procedure     PT G CodesEmmalin, Wettlaufer 03-25-2016, 2:22 PM Ileigh Mettler [facility] Acute Rehabilitation [phone] (pager)

## 2016-03-16 NOTE — Progress Notes (Signed)
Patient confused and pulled out her NGT.  Pt. VSS with no s/s of distress.  Contacted Dr. Reece Agar, updated on patients condition.  Will  not place a new NGT at this time per MD.  Will continue to monitor.

## 2016-03-16 NOTE — Evaluation (Signed)
Occupational Therapy Evaluation Patient Details Name: Sandra Hamilton MRN: [id number] DOB: Feb 05, 1936 Today's Date: 03/16/2016    History of Present Illness This is a frail 80 yo female who presents with one month of gradual weakness and difficulty with PO intake. She states that whenever she tried to eat, she would throw up. She presented to [place] with severe abdominal pain and increasing weakness. She was found to have free air consistent with perforated gastric ulcer. The surgeon covering [place] (Dr. Donne Hazel) was called and he recommended transfer.Closure of perforated ulcer surgically.    Clinical Impression   Pt lived alone with intermittent assist of her children for IADL. Pt was able to sponge bathe, dress, toilet and prepare a simple meal. She walked with a quad cane sometimes. Pt presents with generalized weakness, impaired cognition (no family available for baseline), impaired balance and post operative pain. She requires min to min guard assist for ADL and ADL transfers. Pt will need 24 hour care at home, SNF if family cannot provide. Will follow.    Follow Up Recommendations  Home health OT;Supervision/Assistance - 24 hour    Equipment Recommendations  None recommended by OT    Recommendations for Other Services       Precautions / Restrictions Precautions Precautions: Fall Restrictions Weight Bearing Restrictions: No      Mobility Bed Mobility      General bed mobility comments: pt in chair  Transfers Overall transfer level: Needs assistance Equipment used: Rolling walker (2 wheeled) Transfers: Sit to/from [employer] Sit to Stand: Min guard Stand pivot transfers: Min guard       General transfer comment: good technique, min guard for safety    Balance Overall balance assessment: Needs assistance Sitting-balance support: Feet supported;Bilateral upper extremity supported Sitting balance-Leahy Scale:  Fair Sitting balance - Comments: no LOB with LB dressing from chair   Standing balance support: Bilateral upper extremity supported;During functional activity Standing balance-Leahy Scale: Poor Standing balance comment: relies on UE support onRW                          ADL Overall ADL's : Needs assistance/impaired Eating/Feeding: NPO   Grooming: Supervision/safety;Set up;Sitting   Upper Body Bathing: Supervision/ safety;Set up;Sitting   Lower Body Bathing: Minimal assistance;Sit to/from stand   Upper Body Dressing : Supervision/safety;Set up;Sitting   Lower Body Dressing: Minimal assistance;Sit to/from stand Lower Body Dressing Details (indicate cue type and reason): pt donned one sock bending over, pt not able to cross her foot over opposite knee Toilet Transfer: Min guard;Stand-pivot;RW;BSC   Toileting- Clothing Manipulation and Hygiene: Min guard;Sit to/from stand               Vision Additional Comments: glasses are at home   Perception     Praxis      Pertinent Vitals/Pain Pain Assessment: Faces Pain Score: 10-Worst pain ever Faces Pain Scale: Hurts even more Pain Location: abdomen Pain Descriptors / Indicators: Sore;Grimacing Pain Intervention(s): Limited activity within patient's tolerance;Monitored during session;Premedicated before session;Repositioned     Hand Dominance Right   Extremity/Trunk Assessment Upper Extremity Assessment Upper Extremity Assessment: Generalized weakness   Lower Extremity Assessment Lower Extremity Assessment: Defer to PT evaluation   Cervical / Trunk Assessment Cervical / Trunk Assessment: Normal   Communication Communication Communication: No difficulties   Cognition Arousal/Alertness: Awake/alert Behavior During Therapy: WFL for tasks assessed/performed Overall Cognitive Status: History of cognitive impairments - at baseline  General Comments       Exercises        Shoulder Instructions      Home Living Family/patient expects to be discharged to:: Private residence Living Arrangements: Alone Available Help at Discharge: Family;Available PRN/intermittently (2 children live locally) Type of Home: House Home Access: Stairs to enter [employer] of Steps: 2 Entrance Stairs-Rails: None Home Layout: One level     Bathroom Shower/Tub: Tub/shower unit (does not use, fearful of falling)   Bathroom Toilet: Handicapped height     Home Equipment: Cane - quad   Additional Comments: Pt uses quad cane as needed.      Prior Functioning/Environment Level of Independence: Needs assistance  Gait / Transfers Assistance Needed: walks sometimes with a quad cane ADL's / Homemaking Assistance Needed: sponge bathes standing at sink, independent in self care, son takes her to the beauty shop weekly, family does IADL and errands, pt does not drive, like to read        OT Diagnosis: Generalized weakness;Cognitive deficits;Acute pain   OT Problem List: Decreased strength;Decreased activity tolerance;Impaired balance (sitting and/or standing);Decreased cognition;Decreased safety awareness;Decreased knowledge of use of DME or AE;Pain   OT Treatment/Interventions: Self-care/ADL training;DME and/or AE instruction;Therapeutic activities;Patient/family education;Balance training    OT Goals(Current goals can be found in the care plan section) Acute Rehab OT Goals Patient Stated Goal: to get better OT Goal Formulation: With patient Time For Goal Achievement: 03/23/16 Potential to Achieve Goals: Good ADL Goals Pt Will Perform Grooming: with set-up;standing;with caregiver independent in assisting Pt Will Perform Lower Body Bathing: with supervision;with caregiver independent in assisting;sit to/from stand Pt Will Perform Lower Body Dressing: with supervision;with caregiver independent in assisting;sit to/from stand Pt Will Transfer to Toilet: with  supervision;ambulating;regular height toilet Pt Will Perform Toileting - Clothing Manipulation and hygiene: with supervision;sitting/lateral leans;with caregiver independent in assisting  OT Frequency: Min 2X/week   Barriers to D/C:            Co-evaluation              End of Session Equipment Utilized During Treatment: Surveyor, mining Communication: Mobility status  Activity Tolerance: Patient tolerated treatment well Patient left: in chair;with call bell/phone within reach;with chair alarm set   Time: 1330-1350 OT Time Calculation (min): 20 min Charges:  OT General Charges $OT Visit: 1 Procedure OT Evaluation $OT Eval Moderate Complexity: 1 Procedure G-Codes:    Malka So 03/16/2016, 2:42 PM  [phone]

## 2016-03-16 NOTE — Progress Notes (Signed)
Patient ID: Sandra Hamilton, female   DOB: 17-Jun-1936, 80 y.o.   MRN: [id number]     [facility]      [address] [place], [place] [address]    Phone: [phone] FAX: [phone]     Subjective: bp soft.  UOP okay. Afebrile. No n/v. Minimal output.    Objective:  Vital signs:  Filed Vitals:   03/16/16 0615 03/16/16 0700 03/16/16 0800 03/16/16 0823  BP: _0   Pulse: 89  94   Temp:    97.6 F (36.4 C)  TempSrc:    Oral  Resp: _1 Height:      Weight:      SpO2: 100%  100%        Intake/Output   Yesterday:  05/30 0701 - 05/31 0700 In: 1610.8 [I.V.:1413.3; NG/GT:60; IV Piggyback:137.5] Out: 8315 [Urine:915; Emesis/NG output:100] This shift:  Total I/O In: 100 [I.V.:100] Out: 30 [Urine:30]   Physical Exam: General: Pt awake/alert/oriented x4 in cta.  acute distress Chest: cta.  No chest wall pain w good excursion CV:  Pulses intact.  Regular rhythm MS: Normal AROM mjr joints.  No obvious deformity Abdomen: Soft.  Nondistended. Mildly tender at incisions only. Wound is clean, fascia is intact.  No evidence of peritonitis.  No incarcerated hernias. Ext:  SCDs BLE.  No mjr edema.  No cyanosis Skin: No petechiae / purpura   Problem List:   Active Problems:   Perforated gastric ulcer ([place])    Results:   Labs: Results for orders placed or performed during the hospital encounter of 03/15/16 (from the past 48 hour(s))  CBC with Differential     Status: Abnormal   Collection Time: 03/15/16  3:22 AM  Result Value Ref Range   WBC 3.7 (L) 4.0 - 10.5 K/uL   RBC 5.69 (H) 3.87 - 5.11 MIL/uL   Hemoglobin 15.6 (H) 12.0 - 15.0 g/dL   HCT 47.6 (H) 36.0 - 46.0 %   MCV 83.7 78.0 - 100.0 fL   MCH 27.4 26.0 - 34.0 pg   MCHC 32.8 30.0 - 36.0 g/dL   RDW 15.6 (H) 11.5 - 15.5 %   Platelets 313 150 - 400 K/uL   Neutrophils Relative % 77 %   Neutro Abs 2.9 1.7 - 7.7 K/uL   Lymphocytes Relative 17 %   Lymphs Abs 0.6 (L) 0.7 - 4.0 K/uL   Monocytes Relative 6 %   Monocytes Absolute 0.2 0.1 - 1.0 K/uL   Eosinophils Relative 0 %   Eosinophils Absolute 0.0 0.0 - 0.7 K/uL   Basophils Relative 0 %   Basophils Absolute 0.0 0.0 - 0.1 K/uL  Comprehensive metabolic panel     Status: Abnormal   Collection Time: 03/15/16  3:22 AM  Result Value Ref Range   Sodium 126 (L) 135 - 145 mmol/L   Potassium 5.2 (H) 3.5 - 5.1 mmol/L   Chloride 94 (L) 101 - 111 mmol/L   CO2 20 (L) 22 - 32 mmol/L   Glucose, Bld 197 (H) 65 - 99 mg/dL   BUN 35 (H) 6 - 20 mg/dL   Creatinine, Ser 1.25 (H) 0.44 - 1.00 mg/dL   Calcium 9.7 8.9 - 10.3 mg/dL   Total Protein 5.7 (L) 6.5 - 8.1 g/dL   Albumin 3.2 (L) 3.5 - 5.0 g/dL   AST 18 15 - 41 U/L   ALT 14 14 - 54 U/L   Alkaline Phosphatase  93 38 - 126 U/L   Total Bilirubin 1.1 0.3 - 1.2 mg/dL   GFR calc non Af Amer 40 (L) >60 mL/min   GFR calc Af Amer 47 (L) >60 mL/min    Comment: (NOTE) The eGFR has been calculated using the CKD EPI equation. This calculation has not been validated in all clinical situations. eGFR's persistently <60 mL/min signify possible Chronic Kidney Disease.    Anion gap 12 5 - 15  Lipase, blood     Status: Abnormal   Collection Time: 03/15/16  3:22 AM  Result Value Ref Range   Lipase 136 (H) 11 - 51 U/L  Protime-INR     Status: None   Collection Time: 03/15/16  3:22 AM  Result Value Ref Range   Prothrombin Time 14.7 11.6 - 15.2 seconds   INR 1.13 0.00 - 1.49  I-Stat CG4 Lactic Acid, ED     Status: Abnormal   Collection Time: 03/15/16  3:28 AM  Result Value Ref Range   Lactic Acid, Venous 3.80 (HH) 0.5 - 2.0 mmol/L   Comment NOTIFIED PHYSICIAN   MRSA PCR Screening     Status: None   Collection Time: 03/15/16  7:30 AM  Result Value Ref Range   MRSA by PCR NEGATIVE NEGATIVE    Comment:        The GeneXpert MRSA Assay (FDA approved for NASAL specimens only), is one component of a comprehensive MRSA colonization surveillance program. It  is not intended to diagnose MRSA infection nor to guide or monitor treatment for MRSA infections.   CBC     Status: Abnormal   Collection Time: 03/15/16  9:01 AM  Result Value Ref Range   WBC 5.0 4.0 - 10.5 K/uL   RBC 4.99 3.87 - 5.11 MIL/uL   Hemoglobin 13.7 12.0 - 15.0 g/dL   HCT 41.6 36.0 - 46.0 %   MCV 83.4 78.0 - 100.0 fL   MCH 27.5 26.0 - 34.0 pg   MCHC 32.9 30.0 - 36.0 g/dL   RDW 15.9 (H) 11.5 - 15.5 %   Platelets 247 150 - 400 K/uL  Creatinine, serum     Status: Abnormal   Collection Time: 03/15/16  9:01 AM  Result Value Ref Range   Creatinine, Ser 1.09 (H) 0.44 - 1.00 mg/dL   GFR calc non Af Amer 48 (L) >60 mL/min   GFR calc Af Amer 55 (L) >60 mL/min    Comment: (NOTE) The eGFR has been calculated using the CKD EPI equation. This calculation has not been validated in all clinical situations. eGFR's persistently <60 mL/min signify possible Chronic Kidney Disease.   Magnesium     Status: None   Collection Time: 03/15/16 11:00 AM  Result Value Ref Range   Magnesium 1.7 1.7 - 2.4 mg/dL  Basic metabolic panel     Status: Abnormal   Collection Time: 03/15/16 11:03 AM  Result Value Ref Range   Sodium 130 (L) 135 - 145 mmol/L   Potassium 5.3 (H) 3.5 - 5.1 mmol/L   Chloride 102 101 - 111 mmol/L   CO2 21 (L) 22 - 32 mmol/L   Glucose, Bld 124 (H) 65 - 99 mg/dL   BUN 29 (H) 6 - 20 mg/dL   Creatinine, Ser 1.01 (H) 0.44 - 1.00 mg/dL   Calcium 8.8 (L) 8.9 - 10.3 mg/dL   GFR calc non Af Amer 52 (L) >60 mL/min   GFR calc Af Amer >60 >60 mL/min    Comment: (NOTE) The eGFR  has been calculated using the CKD EPI equation. This calculation has not been validated in all clinical situations. eGFR's persistently <60 mL/min signify possible Chronic Kidney Disease.    Anion gap 7 5 - 15  CBC     Status: Abnormal   Collection Time: 03/16/16  3:26 AM  Result Value Ref Range   WBC 7.7 4.0 - 10.5 K/uL   RBC 4.29 3.87 - 5.11 MIL/uL   Hemoglobin 11.2 (L) 12.0 - 15.0 g/dL   HCT  35.6 (L) 36.0 - 46.0 %   MCV 83.0 78.0 - 100.0 fL   MCH 26.1 26.0 - 34.0 pg   MCHC 31.5 30.0 - 36.0 g/dL   RDW 16.2 (H) 11.5 - 15.5 %   Platelets 219 150 - 400 K/uL  Basic metabolic panel     Status: Abnormal   Collection Time: 03/16/16  3:26 AM  Result Value Ref Range   Sodium 132 (L) 135 - 145 mmol/L   Potassium 4.8 3.5 - 5.1 mmol/L   Chloride 104 101 - 111 mmol/L   CO2 21 (L) 22 - 32 mmol/L   Glucose, Bld 90 65 - 99 mg/dL   BUN 32 (H) 6 - 20 mg/dL   Creatinine, Ser 1.34 (H) 0.44 - 1.00 mg/dL   Calcium 8.5 (L) 8.9 - 10.3 mg/dL   GFR calc non Af Amer 37 (L) >60 mL/min   GFR calc Af Amer 43 (L) >60 mL/min    Comment: (NOTE) The eGFR has been calculated using the CKD EPI equation. This calculation has not been validated in all clinical situations. eGFR's persistently <60 mL/min signify possible Chronic Kidney Disease.    Anion gap 7 5 - 15    Imaging / Studies: No results found.  Medications / Allergies:  Scheduled Meds: . acetaminophen  1,000 mg Intravenous Q6H  . antiseptic oral rinse  7 mL Mouth Rinse q12n4p  . chlorhexidine  15 mL Mouth Rinse BID  . Chlorhexidine Gluconate Cloth  6 each Topical Q0600  . enoxaparin (LOVENOX) injection  40 mg Subcutaneous Q24H  . pantoprazole (PROTONIX) IV  40 mg Intravenous Q12H  . piperacillin-tazobactam (ZOSYN)  IV  3.375 g Intravenous Q8H   Continuous Infusions: . sodium chloride 100 mL/hr at 03/16/16 0000   PRN Meds:.diphenhydrAMINE **OR** diphenhydrAMINE, fentaNYL (SUBLIMAZE) injection, hydrALAZINE, ondansetron **OR** ondansetron (ZOFRAN) IV  Antibiotics: Anti-infectives    Start     Dose/Rate Route Frequency Ordered Stop   03/15/16 0800  piperacillin-tazobactam (ZOSYN) IVPB 3.375 g    Comments:  Zosyn 3.375 g IV q8h for CrCl > 20 mL/min   3.375 g 12.5 mL/hr over 240 Minutes Intravenous Every 8 hours 03/15/16 0741          Assessment/Plan POD#1 exploratory laparotomy, closure of perforated ulcer with graham patch--Dr.  Georgette Dover -NGT, NPO, plan for UGI Friday at the earliest depending on NGT output and bowel function -PPI -IV tylenol, fentanyl for pain -NGT pulled back, check NGT placement before advancing -check h pylori -mobilize, PT eval, encourage IS use  AKI-bp soft, give 500cc fluid bolus, IVF, repeat labs in AM.  No NSAIDs Stage II sacral decub-wound care per woc, clean. Followed in the wound center VTE prophylaxis-SCD/lovenox ID-zosyn D#1/5 Dispo-3S  Erby Pian, [facility] Pager [phone]) For consults and floor pages call [phone](7A-4:30P)  03/16/2016 10:02 AM

## 2016-03-16 NOTE — Progress Notes (Signed)
Report given to [facility].  Pt alert, room air, VSS for transport.  Denies pain/ nausea.  All belongings with pts daughter including her ring.  Family taken to waiting room of 3S.

## 2016-03-16 NOTE — Significant Event (Signed)
Ambulated around unit, walked approximately 350 feet with minimal assistance, pushed a wheelchair for gait support. Returned to room and settled in bed.

## 2016-03-17 ENCOUNTER — Encounter (HOSPITAL_COMMUNITY): Payer: Self-pay | Admitting: Surgery

## 2016-03-17 LAB — BASIC METABOLIC PANEL
Anion gap: 8 (ref 5–15)
BUN: 20 mg/dL (ref 6–20)
CALCIUM: 8.5 mg/dL — AB (ref 8.9–10.3)
CO2: 19 mmol/L — ABNORMAL LOW (ref 22–32)
Chloride: 108 mmol/L (ref 101–111)
Creatinine, Ser: 0.9 mg/dL (ref 0.44–1.00)
GFR calc Af Amer: >60 mL/min (ref >60)
Glucose, Bld: 45 mg/dL — ABNORMAL LOW (ref 65–99)
Potassium: 4.1 mmol/L (ref 3.5–5.1)
SODIUM: 135 mmol/L (ref 135–145)

## 2016-03-17 LAB — H. PYLORI ANTIBODY, IGG

## 2016-03-17 MED ORDER — KCL IN DEXTROSE-NACL 20-5-0.45 MEQ/L-%-% IV SOLN
INTRAVENOUS | Status: DC
  Administered 2016-03-17: 12:00:00 via INTRAVENOUS
  Administered 2016-03-18: 1000 mL via INTRAVENOUS
  Filled 2016-03-17 (×6): qty 1000

## 2016-03-17 MED ORDER — HALOPERIDOL LACTATE 5 MG/ML IJ SOLN
4.0000 mg | Freq: Once | INTRAMUSCULAR | Status: AC
  Administered 2016-03-17: 4 mg via INTRAVENOUS
  Filled 2016-03-17: qty 1

## 2016-03-17 NOTE — Progress Notes (Signed)
2 Days Post-Op  Subjective: She looks good, knows date and seems pretty oriented, she does not remember pulling NG.  Sites look fine.  Objective: Vital signs in last 24 hours: Temp:  [97.4 F (36.3 C)-97.8 F (36.6 C)] 97.5 F (36.4 C) (06/01 0748) Pulse Rate:  [64-89] 66 (06/01 0748) Resp:  [15-34] 21 (06/01 0748) BP: (82-108)/(45-66) 97/48 mmHg (06/01 0748) SpO2:  [97 %-100 %] 99 % (06/01 0748) Weight:  [46.1 kg (101 lb 10.1 oz)] 46.1 kg (101 lb 10.1 oz) (05/31 1657)  NPO 2000 IV Urine 995  NG 100 Afebrile, BP low 80's-100,range Creatinine is improving H pylori is negative Intake/Output from previous day: 05/31 0701 - 06/01 0700 In: 2300 [I.V.:2095; NG/GT:30; IV Piggyback:175] Out: 1055 [Urine:955; Emesis/NG output:100] Intake/Output this shift:    General appearance: alert, cooperative and no distress Resp: clear to auscultation bilaterally and anterior exam GI: soft, sore, but she is not complaining of any pain, few BS, incision looks fine.  wet to dry dressing.  Lab Results:   Recent Labs  03/15/16 0901 03/16/16 0326  WBC 5.0 7.7  HGB 13.7 11.2*  HCT 41.6 35.6*  PLT 247 219    BMET  Recent Labs  03/16/16 0326 03/17/16 0537  NA 132* 135  K 4.8 4.1  CL 104 108  CO2 21* 19*  GLUCOSE 90 45*  BUN 32* 20  CREATININE 1.34* 0.90  CALCIUM 8.5* 8.5*   PT/INR  Recent Labs  03/15/16 0322  LABPROT 14.7  INR 1.13     Recent Labs Lab 03/15/16 0322  AST 18  ALT 14  ALKPHOS 93  BILITOT 1.1  PROT 5.7*  ALBUMIN 3.2*     Lipase     Component Value Date/Time   LIPASE 136* 03/15/2016 0322     Studies/Results: Dg Abd Portable 1v  03/16/2016  CLINICAL DATA:  Nasogastric tube position. EXAM: PORTABLE ABDOMEN - 1 VIEW COMPARISON:  None. FINDINGS: The bowel gas pattern is normal. Nasogastric tube tip is seen in expected position of distal stomach. Severe degenerative changes seen involving both hip joint. IMPRESSION: Nasogastric tube tip seen in  expected position of distal stomach. Electronically Signed   By: Marijo Conception, M.D.   On: 03/16/2016 10:13   Prior to Admission medications   Medication Sig Start Date End Date Taking? Authorizing Provider  amLODipine (NORVASC) 5 MG tablet TAKE 1 TABLET (5 MG TOTAL) BY MOUTH DAILY. Patient taking differently: Take 2.5 mg by mouth daily.  02/12/16  Yes Claretta Fraise, MD  calcium carbonate (OS-CAL) 600 MG TABS tablet Take 600 mg by mouth daily with breakfast.   Yes Historical Provider, MD  donepezil (ARICEPT) 5 MG tablet Take 1 tablet (5 mg total) by mouth at bedtime. 03/07/16  Yes Claretta Fraise, MD  doxycycline (VIBRAMYCIN) 100 MG capsule Take 1 capsule (100 mg total) by mouth 2 (two) times daily. 02/23/16  Yes Isla Pence, MD  fluticasone (FLONASE) 50 MCG/ACT nasal spray Place 2 sprays into both nostrils daily. 07/01/15  Yes Timmothy Euler, MD  ibuprofen (ADVIL,MOTRIN) 200 MG tablet Take 200-400 mg by mouth every 6 (six) hours as needed for moderate pain.   Yes Historical Provider, MD  meloxicam (MOBIC) 15 MG tablet Take 1 tablet (15 mg total) by mouth daily. For joint and muscle pain 12/10/15  Yes Claretta Fraise, MD  Misc Natural Products (OSTEO BI-FLEX JOINT SHIELD PO) Apply 1 application topically as needed (pain).    Yes Historical Provider, MD  Nutritional Supplements (  ENSURE HIGH PROTEIN) LIQD Drink 2 -4 cans daily 03/08/16  Yes Claretta Fraise, MD  Omega-3 Fatty Acids (FISH OIL) 1200 MG CAPS Take 1 capsule by mouth 2 (two) times daily.   Yes Historical Provider, MD  sulfamethoxazole-trimethoprim (BACTRIM DS,SEPTRA DS) 800-160 MG tablet Take 1 tablet by mouth 2 (two) times daily.   Yes Historical Provider, MD     Medications: . Chlorhexidine Gluconate Cloth  6 each Topical Q0600  . enoxaparin (LOVENOX) injection  30 mg Subcutaneous Q24H  . pantoprazole (PROTONIX) IV  40 mg Intravenous Q12H  . piperacillin-tazobactam (ZOSYN)  IV  3.375 g Intravenous Q8H   . sodium chloride 100 mL/hr at  03/16/16 1900     Assessment/Plan  Perforated gastric ulcer Exploratory laparotomy, closure of perforated ulcer with Phillip Heal patch, 03/15/16, Dr. Donnie Mesa Hx of hypertension Hx of dementia IBS Arthritis FEN: NPO/IV fluids/NG pulled out last PM by pt. ID:  Day 3 Zosyn DVT:  Lovenox   Plan:  Continue current meds, plan UGI tomorrow.      LOS: 2 days    Debara Kamphuis 03/17/2016 [phone]

## 2016-03-17 NOTE — Progress Notes (Signed)
Patient has had several episodes of confusion. Pt currently thinks her mother was just visiting her and that she needs to go to her mom's. Pt stated, "I don't need [place] at the airport any longer." Pt tearful and upset. Pt attempted to take out IV but was easily re-directed. Support given to patient along with re-orientation.

## 2016-03-18 ENCOUNTER — Inpatient Hospital Stay (HOSPITAL_COMMUNITY): Payer: Medicare Other

## 2016-03-18 MED ORDER — DIATRIZOATE MEGLUMINE & SODIUM 66-10 % PO SOLN: 120.0000 mL | Freq: Once | ORAL | Status: DC

## 2016-03-18 MED ORDER — DIATRIZOATE MEGLUMINE & SODIUM 66-10 % PO SOLN
ORAL | Status: AC
  Administered 2016-03-18: 120 mL via ORAL
  Filled 2016-03-18: qty 60

## 2016-03-18 MED ORDER — HALOPERIDOL LACTATE 5 MG/ML IJ SOLN
INTRAMUSCULAR | Status: AC
  Administered 2016-03-18: 5 mg
  Filled 2016-03-18: qty 1

## 2016-03-18 MED ORDER — DIATRIZOATE MEGLUMINE & SODIUM 66-10 % PO SOLN
ORAL | Status: AC
  Filled 2016-03-18: qty 90

## 2016-03-18 NOTE — Progress Notes (Signed)
3 Days Post-Op  Subjective: She looks fine and is telling me about what I assume is her UGI, says she is OK in air plane, but afraid of heights.  She didn't know where she was this AM, the year is 67, president was Trump, she got the month right, but she confused with me and her minister when I ask her about the Ministers card on the table.  She also had some confusion last PM.  Family is requesting Neurology evaluation and recommendations for future treatment and discharge planning.   She seems to be doing fine from her surgery and the incision looks great.  Objective: Vital signs in last 24 hours: Temp:  [97.8 F (36.6 C)-98 F (36.7 C)] 97.8 F (36.6 C) (06/02 0854) Pulse Rate:  [69-77] 76 (06/02 0852) Resp:  [15-20] 15 (06/02 0852) BP: (105-128)/(55-78) 128/78 mmHg (06/02 0852) SpO2:  [98 %-100 %] 99 % (06/02 0852)  1387 iv YESTERDAY 1250 URINE YESTERDAY No bm Afebrile, VSS Labs OK UGI:  No evidence for contrast extravasation from the distal stomach.  Duodenal diverticulum. Intake/Output from previous day: 06/01 0701 - 06/02 0700 In: 1387.5 [I.V.:1387.5] Out: 1250 [Urine:1250] Intake/Output this shift: Total I/O In: 225 [I.V.:225] Out: 275 [Urine:275]  General appearance: alert, cooperative, no distress and confused, but very active and conversant.   Resp: clear to auscultation bilaterally GI: soft, sore, + BS, no Bm, incision looks good.  Lab Results:   Recent Labs  03/16/16 0326  WBC 7.7  HGB 11.2*  HCT 35.6*  PLT 219    BMET  Recent Labs  03/16/16 0326 03/17/16 0537  NA 132* 135  K 4.8 4.1  CL 104 108  CO2 21* 19*  GLUCOSE 90 45*  BUN 32* 20  CREATININE 1.34* 0.90  CALCIUM 8.5* 8.5*   PT/INR No results for input(s): LABPROT, INR in the last 72 hours.   Recent Labs Lab 03/15/16 0322  AST 18  ALT 14  ALKPHOS 93  BILITOT 1.1  PROT 5.7*  ALBUMIN 3.2*     Lipase     Component Value Date/Time   LIPASE 136* 03/15/2016 0322      Studies/Results: Dg Abd Portable 1v  03/16/2016  CLINICAL DATA:  Nasogastric tube position. EXAM: PORTABLE ABDOMEN - 1 VIEW COMPARISON:  None. FINDINGS: The bowel gas pattern is normal. Nasogastric tube tip is seen in expected position of distal stomach. Severe degenerative changes seen involving both hip joint. IMPRESSION: Nasogastric tube tip seen in expected position of distal stomach. Electronically Signed   By: Marijo Conception, M.D.   On: 03/16/2016 10:13   Dg Duanne Limerick W/water Sol Cm  03/18/2016  CLINICAL DATA:  80 year old female with gastric perforation in the antral pyloric region. Status post repair on 03/15/2026. EXAM: WATER SOLUBLE UPPER GI SERIES TECHNIQUE: Single-column upper GI series was performed using water soluble contrast. CONTRAST:  Water-soluble contrast material. COMPARISON:  CT scan 03/15/2016. FLUOROSCOPY TIME:  Radiation Exposure Index (as provided by the fluoroscopic device): If the device does not provide the exposure index: Fluoroscopy Time (in minutes and seconds): 3 minutes and 46 seconds. Number of Acquired Images: FINDINGS: Patient was given water-soluble contrast material by mouth. Imaging was performed with the patient supine and bilateral oblique positions. Movement of contrast material through the stomach is sluggish. Deformity in the region of the antrum is compatible with recent surgery. Contrast material does eventually flow through the pylorus into the duodenum. There is no evidence of contrast extravasation from the distal stomach  to suggest leak. Duodenum is nondilated. Patient is noted to have a somewhat prominent duodenal diverticulum distally. IMPRESSION: No evidence for contrast extravasation from the distal stomach. Duodenal diverticulum. Electronically Signed   By: Misty Stanley M.D.   On: 03/18/2016 08:46    Medications: . Chlorhexidine Gluconate Cloth  6 each Topical Q0600  . diatrizoate meglumine-sodium  120 mL Oral Once  . diatrizoate meglumine-sodium       . enoxaparin (LOVENOX) injection  30 mg Subcutaneous Q24H  . pantoprazole (PROTONIX) IV  40 mg Intravenous Q12H  . piperacillin-tazobactam (ZOSYN)  IV  3.375 g Intravenous Q8H    Assessment/Plan Perforated gastric ulcer Exploratory laparotomy, closure of perforated ulcer with Phillip Heal patch, 03/15/16, Dr. Donnie Mesa Hx of hypertension Hx of dementia IBS Arthritis FEN: NPO/IV fluids/NG pulled out last PM by pt. ID: Day 4 Zosyn DVT: Lovenox   Plan:  Start her on sips of clears now and advance as she tolerates her diet.  I will get speech to see also.  Neurology consult today has been requested.    If she does well with clears we can advance her diet tomorrow.       LOS: 3 days    Ethie Curless 03/18/2016 [phone]

## 2016-03-18 NOTE — Progress Notes (Signed)
   03/18/16 1116  PT Visit Information  Last PT Received On 03/18/16  Assistance Needed +1  History of Present Illness This is a frail 80 yo female who presents with one month of gradual weakness and difficulty with PO intake. She states that whenever she tried to eat, she would throw up. She presented to [facility] with severe abdominal pain and increasing weakness. She was found to have free air consistent with perforated gastric ulcer. The surgeon covering [place] (Dr. Donne Hazel) was called and he recommended transfer.Closure of perforated ulcer surgically.   Subjective Data  Subjective "I need to get back in the bed."  Precautions  Precautions Fall  Restrictions  Weight Bearing Restrictions No  Pain Assessment  Pain Assessment No/denies pain  Cognition  Arousal/Alertness Awake/alert  Behavior During Therapy [facility] for tasks assessed/performed  Overall Cognitive Status History of cognitive impairments - at baseline  Bed Mobility  Overal bed mobility Needs Assistance;+2 for physical assistance  Bed Mobility Sit to Supine  Sit to supine Min assist  General bed mobility comments Pt was trying to exit chair and chair alarm was going off.  Stopped pt from getting up and assisted her back to bed as she states she just wanted to go back to bed.  Pt needed cues and assist  due to confusion.   Transfers  Overall transfer level Needs assistance  Equipment used Rolling walker (2 wheeled)  Transfers Sit to/from Stand  Sit to Stand Min guard  Stand pivot transfers Min guard  General transfer comment good technique, min guard for safety  Balance  Overall balance assessment Needs assistance  Sitting-balance support No upper extremity supported;Feet supported  Sitting balance-Leahy Scale Fair  Standing balance support Bilateral upper extremity supported;During functional activity  Standing balance-Leahy Scale Poor  Standing balance comment relies on UEs for support  PT - End  of Session  Equipment Utilized During Treatment Gait belt  Activity Tolerance Patient limited by fatigue  Patient left in bed;with call bell/phone within reach;with bed alarm set  Nurse Communication Mobility status  PT - Assessment/Plan  PT Plan Current plan remains appropriate  PT Frequency (ACUTE ONLY) Min 3X/week  Follow Up Recommendations Home health PT;Supervision/Assistance - 24 hour (if no 24 hours, may need SNF)  PT equipment Rolling walker with 5" wheels;3in1 (PT)  PT Goal Progression  Progress towards PT goals Progressing toward goals  PT Time Calculation  PT Start Time (ACUTE ONLY) 1042  PT Stop Time (ACUTE ONLY) 1051  PT Time Calculation (min) (ACUTE ONLY) 9 min  PT General Charges  $$ ACUTE PT VISIT 1 Procedure  PT Treatments  $Therapeutic Activity 8-22 mins  Jeda Pardue Clymer,PT Acute Rehabilitation [phone] (pager)

## 2016-03-18 NOTE — Progress Notes (Signed)
Physical Therapy Treatment Patient Details Name: Sandra Hamilton MRN: [id number] DOB: 1936/09/28 Today's Date: 03/18/2016    History of Present Illness This is a frail 80 yo female who presents with one month of gradual weakness and difficulty with PO intake. She states that whenever she tried to eat, she would throw up. She presented to [facility] with severe abdominal pain and increasing weakness. She was found to have free air consistent with perforated gastric ulcer. The surgeon covering [place] (Dr. Donne Hazel) was called and he recommended transfer.Closure of perforated ulcer surgically.     PT Comments    Pt admitted with above diagnosis. Pt currently with functional limitations due to balance and endurance deficits. Pt was able to ambulate around unit with RW with min assist and cues for safety.  Pt continues to be very confused which is limiting pts progress.  Will have to have 24 hour care.  Pt will benefit from skilled PT to increase their independence and safety with mobility to allow discharge to the venue listed below.    Follow Up Recommendations  Home health PT;Supervision/Assistance - 24 hour (if no 24 hours, may need SNF)     Equipment Recommendations  Rolling walker with 5" wheels;3in1 (PT)    Recommendations for Other Services       Precautions / Restrictions Precautions Precautions: Fall Restrictions Weight Bearing Restrictions: No    Mobility  Bed Mobility Overal bed mobility: Needs Assistance Bed Mobility: Supine to Sit     Supine to sit: Min assist     General bed mobility comments: cues and assist needed due to confusion.   Transfers Overall transfer level: Needs assistance Equipment used: Rolling walker (2 wheeled) Transfers: Sit to/from Stand Sit to Stand: Min guard Stand pivot transfers: Min guard       General transfer comment: good technique, min guard for safety  Ambulation/Gait Ambulation/Gait assistance: Min  assist Ambulation Distance (Feet): 360 Feet Assistive device: Rolling walker (2 wheeled) Gait Pattern/deviations: Step-through pattern;Decreased stride length;Drifts right/left   Gait velocity interpretation: Below normal speed for age/gender General Gait Details: Pt needed cues for sequencing steps and RW.  Pt had difficulty with turns needing help with steering RW and staying close to RW.  Difficulty maneuvering around obstacles as well.    Stairs            Wheelchair Mobility    Modified Rankin (Stroke Patients Only)       Balance Overall balance assessment: Needs assistance Sitting-balance support: No upper extremity supported;Feet supported Sitting balance-Leahy Scale: Fair Sitting balance - Comments: can sit EOB without Assist.    Standing balance support: Bilateral upper extremity supported;During functional activity Standing balance-Leahy Scale: Poor Standing balance comment: relies on RW for support              High level balance activites: Direction changes;Turns;Sudden stops High Level Balance Comments: needed min assist for stability     Cognition Arousal/Alertness: Awake/alert Behavior During Therapy: WFL for tasks assessed/performed Overall Cognitive Status: History of cognitive impairments - at baseline                      Exercises General Exercises - Lower Extremity Ankle Circles/Pumps: AROM;Both;10 reps;Seated Long Arc Quad: AROM;Both;10 reps;Seated    General Comments        Pertinent Vitals/Pain Pain Assessment: No/denies pain  VSS    Home Living  Prior Function            PT Goals (current goals can now be found in the care plan section) Progress towards PT goals: Progressing toward goals    Frequency  Min 3X/week    PT Plan Current plan remains appropriate    Co-evaluation             End of Session Equipment Utilized During Treatment: Gait belt Activity Tolerance: Patient  limited by fatigue Patient left: in chair;with call bell/phone within reach;with chair alarm set     Time: [id number] PT Time Calculation (min) (ACUTE ONLY): 33 min  Charges:  $Gait Training: 8-22 mins $Therapeutic Exercise: 8-22 mins                    G Codes:      Delosreyes, Arrie Aran F March 25, 2016, 11:11 AM [employer] Acute Rehabilitation [phone] (pager)

## 2016-03-18 NOTE — Care Management Note (Signed)
Case Management Note  Patient Details  Name: Sandra Hamilton MRN: [id number] Date of Birth: 1935/11/21  Subjective/Objective:   Patient is from home alone, she is active with [facility] for [facility], [place], [place], will need to  resume as [place], (high risk for readmission) , confirmed with Bethena Roys with [facility] they will make patient [place] with them at discharge. Please notify [facility] when patient is dc.  NCM will cont to follow for dc needs.                  Action/Plan:   Expected Discharge Date:   (pending)               Expected Discharge Plan:  [place]  In-House Referral:     Discharge planning Services  CM Consult  Post Acute Care Choice:  Resumption of Svcs/PTA Provider Choice offered to:     DME Arranged:    DME Agency:     HH Arranged:  RN, PT, OT HH Agency:     Status of Service:  In process, will continue to follow  Medicare Important Message Given:    Date Medicare IM Given:    Medicare IM give by:    Date Additional Medicare IM Given:    Additional Medicare Important Message give by:     If discussed at [place] of Stay Meetings, dates discussed:    Additional Comments:  Zenon Mayo, RN 03/18/2016, 5:45 PM

## 2016-03-19 ENCOUNTER — Inpatient Hospital Stay (HOSPITAL_COMMUNITY): Payer: Medicare Other

## 2016-03-19 DIAGNOSIS — R4182 Altered mental status, unspecified: Secondary | ICD-10-CM

## 2016-03-19 MED ORDER — ACETAMINOPHEN 325 MG PO TABS: 650.0000 mg | ORAL_TABLET | Freq: Four times a day (QID) | ORAL | Status: DC | PRN

## 2016-03-19 MED ORDER — TRAMADOL HCL 50 MG PO TABS
50.0000 mg | ORAL_TABLET | Freq: Four times a day (QID) | ORAL | Status: DC | PRN
  Administered 2016-03-22: 50 mg via ORAL
  Filled 2016-03-19: qty 1

## 2016-03-19 MED ORDER — SACCHAROMYCES BOULARDII 250 MG PO CAPS
250.0000 mg | ORAL_CAPSULE | Freq: Two times a day (BID) | ORAL | Status: DC
  Administered 2016-03-19 – 2016-03-22 (×7): 250 mg via ORAL
  Filled 2016-03-19 (×7): qty 1

## 2016-03-19 MED ORDER — PANTOPRAZOLE SODIUM 40 MG PO TBEC
40.0000 mg | DELAYED_RELEASE_TABLET | Freq: Two times a day (BID) | ORAL | Status: DC
  Administered 2016-03-19 – 2016-03-22 (×7): 40 mg via ORAL
  Filled 2016-03-19 (×8): qty 1

## 2016-03-19 MED ORDER — FENTANYL CITRATE (PF) 100 MCG/2ML IJ SOLN
12.5000 ug | INTRAMUSCULAR | Status: DC | PRN
  Administered 2016-03-21: 25 ug via INTRAVENOUS
  Filled 2016-03-19: qty 2

## 2016-03-19 NOTE — Progress Notes (Signed)
4 Days Post-Op  Subjective: She is much clearer this AM, not confused knows year and where she is Trump is president and remembers what she told me yesterday about her voting record. Up in the chair and I saw her walking with PT yesterday.   Tolerating clears, having allot of loose stool.  She passed swallowing study, and her site looks fine.  Objective: Vital signs in last 24 hours: Temp:  [97.3 F (36.3 C)-98.4 F (36.9 C)] 97.3 F (36.3 C) (06/03 0104) Pulse Rate:  [76-90] 87 (06/03 0104) Resp:  [15-26] 21 (06/03 0104) BP: (100-133)/(66-78) 100/66 mmHg (06/03 0104) SpO2:  [98 %-100 %] 100 % (06/03 0104) Last BM Date: 03/18/16 1230 PO recorded 375 urine recorded (incontinent) BM x 5 Afebrile, VSS Labs OK UGI:   no extravasation  Intake/Output from previous day: 06/02 0701 - 06/03 0700 In: 2270 [P.O.:120; I.V.:1800; IV Piggyback:350] Out: 375 [Urine:375] Intake/Output this shift:    General appearance: alert, cooperative and no distress Resp: clear to auscultation bilaterally GI: soft, + BS, tolerating clears, + BM's loose.  Open incision looks fine.    Lab Results:  No results for input(s): WBC, HGB, HCT, PLT in the last 72 hours.  BMET  Recent Labs  03/17/16 0537  NA 135  K 4.1  CL 108  CO2 19*  GLUCOSE 45*  BUN 20  CREATININE 0.90  CALCIUM 8.5*   PT/INR No results for input(s): LABPROT, INR in the last 72 hours.   Recent Labs Lab 03/15/16 0322  AST 18  ALT 14  ALKPHOS 93  BILITOT 1.1  PROT 5.7*  ALBUMIN 3.2*     Lipase     Component Value Date/Time   LIPASE 136* 03/15/2016 0322     Studies/Results: Dg Ugi W/water Sol Cm  03/18/2016  CLINICAL DATA:  80 year old female with gastric perforation in the antral pyloric region. Status post repair on 03/15/2026. EXAM: WATER SOLUBLE UPPER GI SERIES TECHNIQUE: Single-column upper GI series was performed using water soluble contrast. CONTRAST:  Water-soluble contrast material. COMPARISON:  CT scan  03/15/2016. FLUOROSCOPY TIME:  Radiation Exposure Index (as provided by the fluoroscopic device): If the device does not provide the exposure index: Fluoroscopy Time (in minutes and seconds): 3 minutes and 46 seconds. Number of Acquired Images: FINDINGS: Patient was given water-soluble contrast material by mouth. Imaging was performed with the patient supine and bilateral oblique positions. Movement of contrast material through the stomach is sluggish. Deformity in the region of the antrum is compatible with recent surgery. Contrast material does eventually flow through the pylorus into the duodenum. There is no evidence of contrast extravasation from the distal stomach to suggest leak. Duodenum is nondilated. Patient is noted to have a somewhat prominent duodenal diverticulum distally. IMPRESSION: No evidence for contrast extravasation from the distal stomach. Duodenal diverticulum. Electronically Signed   By: Misty Stanley M.D.   On: 03/18/2016 08:46    Medications: . Chlorhexidine Gluconate Cloth  6 each Topical Q0600  . diatrizoate meglumine-sodium  120 mL Oral Once  . enoxaparin (LOVENOX) injection  30 mg Subcutaneous Q24H  . pantoprazole (PROTONIX) IV  40 mg Intravenous Q12H  . piperacillin-tazobactam (ZOSYN)  IV  3.375 g Intravenous Q8H   . dextrose 5 % and 0.45 % NaCl with KCl 20 mEq/L 75 mL/hr at 03/19/16 0600   Prior to Admission medications   Medication Sig Start Date End Date Taking? Authorizing Provider  amLODipine (NORVASC) 5 MG tablet TAKE 1 TABLET (5 MG TOTAL) BY  MOUTH DAILY. Patient taking differently: Take 2.5 mg by mouth daily.  02/12/16  Yes Claretta Fraise, MD  calcium carbonate (OS-CAL) 600 MG TABS tablet Take 600 mg by mouth daily with breakfast.   Yes Historical Provider, MD  donepezil (ARICEPT) 5 MG tablet Take 1 tablet (5 mg total) by mouth at bedtime. 03/07/16  Yes Claretta Fraise, MD  doxycycline (VIBRAMYCIN) 100 MG capsule Take 1 capsule (100 mg total) by mouth 2 (two) times  daily. 02/23/16  Yes Isla Pence, MD  fluticasone (FLONASE) 50 MCG/ACT nasal spray Place 2 sprays into both nostrils daily. 07/01/15  Yes Timmothy Euler, MD  ibuprofen (ADVIL,MOTRIN) 200 MG tablet Take 200-400 mg by mouth every 6 (six) hours as needed for moderate pain.   Yes Historical Provider, MD  meloxicam (MOBIC) 15 MG tablet Take 1 tablet (15 mg total) by mouth daily. For joint and muscle pain 12/10/15  Yes Claretta Fraise, MD  Misc Natural Products (OSTEO BI-FLEX JOINT SHIELD PO) Apply 1 application topically as needed (pain).    Yes Historical Provider, MD  Nutritional Supplements (ENSURE HIGH PROTEIN) LIQD Drink 2 -4 cans daily 03/08/16  Yes Claretta Fraise, MD  Omega-3 Fatty Acids (FISH OIL) 1200 MG CAPS Take 1 capsule by mouth 2 (two) times daily.   Yes Historical Provider, MD  sulfamethoxazole-trimethoprim (BACTRIM DS,SEPTRA DS) 800-160 MG tablet Take 1 tablet by mouth 2 (two) times daily.   Yes Historical Provider, MD     Assessment/Plan Perforated gastric ulcer Exploratory laparotomy, closure of perforated ulcer with Phillip Heal patch, 03/15/16, Dr. Donnie Mesa Hx of hypertension Hx of dementia Decubitus stage 4 (from Home) IBS Arthritis FEN: NPO/IV fluids/NG pulled out last PM by pt. ID: Day 5 Zosyn  (H Pylori negative) DVT: Lovenox  Plan:  Neurology is planning to see and evaluate her dementia issues.  She lives at home alone currently.  I am going to advance her diet. Her only home med of note is amlodipine and she does not need it.  I will add Probiotic.  I have not seen her decubitus but will try to see with next dressing change.   i will try her on tylenol and Ultram for pain and see how she does.        LOS: 4 days    Franki Stemen 03/19/2016 [phone]

## 2016-03-19 NOTE — Consult Note (Signed)
Requesting Physician: Earnstine Regal, PA    Reason for consultation:  altered mental status  HPI:                                                                                                                                         Sandra Hamilton is an 80 y.o. female patient who is admitted with a perforated gastric ulcer, status post surgery. She is noted to have some sundowning and altered mental status especially in the evenings. Neurology service consulted for evaluation. No history of noticing any focal neurological symptoms, no seizure-like activity. Patient lives alone. No family at bedside. History obtained from the EMR and discussing with nursing staff. Patient unable to provide history.   Past Medical History: Past Medical History  Diagnosis Date  . Hypertension   . Irritable bowel syndrome   . Arthritis     Past Surgical History  Procedure Laterality Date  . Abdominal hysterectomy    . Appendectomy    . Thyroidectomy    . Breast biopsy Left   . Cataract extraction w/phaco Right 11/12/2015    Procedure: CATARACT EXTRACTION PHACO AND INTRAOCULAR LENS PLACEMENT RIGHT EYE;  Surgeon: Tonny Branch, MD;  Location: [facility];  Service: Ophthalmology;  Laterality: Right;  CDE 7.40  . Cataract extraction w/phaco Left 12/28/2015    Procedure: CATARACT EXTRACTION PHACO AND INTRAOCULAR LENS PLACEMENT LEFT EYE CDE=4.79;  Surgeon: Tonny Branch, MD;  Location: [facility];  Service: Ophthalmology;  Laterality: Left;  . Abdominal surgery Bilateral 15 Mar 2016  . Colonoscopy  2008  . Eye surgery Bilateral march 2017    cataracts removed  . Laparotomy N/A 03/15/2016    Procedure: EXPLORATORY LAPAROTOMY, CLOSURE OF PYLORIC ULCER ;  Surgeon: Johnathan Hausen, MD;  Location: [place];  Service: General;  Laterality: N/A;    Family History: Family History  Problem Relation Age of Onset  . Heart disease Mother   . Diabetes Father   . Heart disease Sister   . Dementia Mother     Social History:   reports that she has never smoked. She has never used smokeless tobacco. She reports that she does not drink alcohol or use illicit drugs.  Allergies:  Allergies  Allergen Reactions  . Bextra [Valdecoxib] Shortness Of Breath    States she can take ibuprofen  . Codeine Other (See Comments)    HEADACHE  . Detrol La [Tolterodine Tartrate Er]     Dizziness   . Lipitor [Atorvastatin] Other (See Comments)    REACTION IS UNKNOWN  . Morphine And Related Other (See Comments)    "loopy"  . Prednisone Other (See Comments)    REACTION IS UNKNOWN     Medications:  Current facility-administered medications:  .  acetaminophen (TYLENOL) tablet 650 mg, 650 mg, Oral, Q6H PRN, Earnstine Regal, PA-C .  Chlorhexidine Gluconate Cloth 2 % PADS 6 each, 6 each, Topical, Q0600, Donnie Mesa, MD, 6 each at 03/18/16 [phone] .  dextrose 5 % and 0.45 % NaCl with KCl 20 mEq/L infusion, , Intravenous, Continuous, Earnstine Regal, PA-C, Last Rate: 50 mL/hr at 03/19/16 0902, 50 mL/hr at 03/19/16 0902 .  diatrizoate meglumine-sodium (GASTROGRAFIN) 66-10 % solution 120 mL, 120 mL, Oral, Once, Greer Pickerel, MD .  diphenhydrAMINE (BENADRYL) 12.5 MG/5ML elixir 12.5 mg, 12.5 mg, Oral, Q6H PRN **OR** diphenhydrAMINE (BENADRYL) injection 12.5 mg, 12.5 mg, Intravenous, Q6H PRN, Donnie Mesa, MD, 12.5 mg at 03/18/16 2126 .  enoxaparin (LOVENOX) injection 30 mg, 30 mg, Subcutaneous, Q24H, Donnie Mesa, MD, 30 mg at 03/18/16 0903 .  fentaNYL (SUBLIMAZE) injection 12.5-25 mcg, 12.5-25 mcg, Intravenous, Q1H PRN, Earnstine Regal, PA-C .  hydrALAZINE (APRESOLINE) injection 10 mg, 10 mg, Intravenous, Q2H PRN, Donnie Mesa, MD .  ondansetron (ZOFRAN-ODT) disintegrating tablet 4 mg, 4 mg, Oral, Q6H PRN **OR** ondansetron (ZOFRAN) injection 4 mg, 4 mg, Intravenous, Q6H PRN, Donnie Mesa, MD .  pantoprazole  (PROTONIX) EC tablet 40 mg, 40 mg, Oral, BID, Earnstine Regal, PA-C .  piperacillin-tazobactam (ZOSYN) IVPB 3.375 g, 3.375 g, Intravenous, Q8H, Johnathan Hausen, MD, 3.375 g at 03/19/16 0000 .  saccharomyces boulardii (FLORASTOR) capsule 250 mg, 250 mg, Oral, BID, Earnstine Regal, PA-C .  traMADol Veatrice Bourbon) tablet 50-100 mg, 50-100 mg, Oral, Q6H PRN, Earnstine Regal, PA-C   ROS:                                                                                                                                       History   unobtainable from patient due to mental status  Neurologic Examination:                                                                                                    Today's Vitals   03/18/16 1500 03/18/16 1955 03/19/16 0104 03/19/16 0756  BP:  133/75 100/66 126/71  Pulse:   87 90  Temp: 97.3 F (36.3 C) 98.2 F (36.8 C) 97.3 F (36.3 C)   TempSrc: Oral Oral Oral   Resp:  19 21 15   Height:      Weight:      SpO2:  100% 100% 100%  PainSc:    0-No pain    Evaluation of higher integrative functions including: Level of  alertness: Alert,  Oriented to time, place and person, she got it was 1917 and corrected herself to 2017 Able to perform serial threes with mild prompting.  Attention span and concentration  - intact   Speech: fluent, no evidence of dysarthria or aphasia noted.  Test the following cranial nerves: 2-12 grossly intact Motor examination: Normal tone, bulk, full motor strength in all 4 extremities. She had pain with elevation of bilateral legs proximally, but able to demonstrate good strength with encouragement Examination of sensation : symmetric sensation to pinprick in all 4 extremities and on face Examination of deep tendon reflexes: 2+,  symmetric in all extremities, normal plantars bilaterally Test coordination: Normal finger nose testing, with no evidence of limb appendicular ataxia or abnormal involuntary movements or tremors noted.  Gait:  Deferred       Lab Results: Basic Metabolic Panel:  Recent Labs Lab 03/15/16 0322 03/15/16 0901 03/15/16 1100 03/15/16 1103 03/16/16 0326 03/17/16 0537  NA 126*  --   --  130* 132* 135  K 5.2*  --   --  5.3* 4.8 4.1  CL 94*  --   --  102 104 108  CO2 20*  --   --  21* 21* 19*  GLUCOSE 197*  --   --  124* 90 45*  BUN 35*  --   --  29* 32* 20  CREATININE 1.25* 1.09*  --  1.01* 1.34* 0.90  CALCIUM 9.7  --   --  8.8* 8.5* 8.5*  MG  --   --  1.7  --   --   --     Liver Function Tests:  Recent Labs Lab 03/15/16 0322  AST 18  ALT 14  ALKPHOS 93  BILITOT 1.1  PROT 5.7*  ALBUMIN 3.2*    Recent Labs Lab 03/15/16 0322  LIPASE 136*   No results for input(s): AMMONIA in the last 168 hours.  CBC:  Recent Labs Lab 03/15/16 0322 03/15/16 0901 03/16/16 0326  WBC 3.7* 5.0 7.7  NEUTROABS 2.9  --   --   HGB 15.6* 13.7 11.2*  HCT 47.6* 41.6 35.6*  MCV 83.7 83.4 83.0  PLT 313 247 219    Cardiac Enzymes: No results for input(s): CKTOTAL, CKMB, CKMBINDEX, TROPONINI in the last 168 hours.  Lipid Panel: No results for input(s): CHOL, TRIG, HDL, CHOLHDL, VLDL, LDLCALC in the last 168 hours.  CBG: No results for input(s): GLUCAP in the last 168 hours.  Microbiology: Results for orders placed or performed during the hospital encounter of 03/15/16  MRSA PCR Screening     Status: None   Collection Time: 03/15/16  7:30 AM  Result Value Ref Range Status   MRSA by PCR NEGATIVE NEGATIVE Final    Comment:        The GeneXpert MRSA Assay (FDA approved for NASAL specimens only), is one component of a comprehensive MRSA colonization surveillance program. It is not intended to diagnose MRSA infection nor to guide or monitor treatment for MRSA infections.      Imaging: Dg Abd Portable 1v  03/16/2016  CLINICAL DATA:  Nasogastric tube position. EXAM: PORTABLE ABDOMEN - 1 VIEW COMPARISON:  None. FINDINGS: The bowel gas pattern is normal. Nasogastric tube tip is seen  in expected position of distal stomach. Severe degenerative changes seen involving both hip joint. IMPRESSION: Nasogastric tube tip seen in expected position of distal stomach. Electronically Signed   By: Marijo Conception, M.D.   On: 03/16/2016 10:13  Dg Ugi W/water Sol Cm  03/18/2016  CLINICAL DATA:  80 year old female with gastric perforation in the antral pyloric region. Status post repair on 03/15/2026. EXAM: WATER SOLUBLE UPPER GI SERIES TECHNIQUE: Single-column upper GI series was performed using water soluble contrast. CONTRAST:  Water-soluble contrast material. COMPARISON:  CT scan 03/15/2016. FLUOROSCOPY TIME:  Radiation Exposure Index (as provided by the fluoroscopic device): If the device does not provide the exposure index: Fluoroscopy Time (in minutes and seconds): 3 minutes and 46 seconds. Number of Acquired Images: FINDINGS: Patient was given water-soluble contrast material by mouth. Imaging was performed with the patient supine and bilateral oblique positions. Movement of contrast material through the stomach is sluggish. Deformity in the region of the antrum is compatible with recent surgery. Contrast material does eventually flow through the pylorus into the duodenum. There is no evidence of contrast extravasation from the distal stomach to suggest leak. Duodenum is nondilated. Patient is noted to have a somewhat prominent duodenal diverticulum distally. IMPRESSION: No evidence for contrast extravasation from the distal stomach. Duodenal diverticulum. Electronically Signed   By: Misty Stanley M.D.   On: 03/18/2016 08:46     Assessment and plan:   ONNA NAHAR is an 80 y.o. female patient who is admitted for perforated gastric ulcer, status post surgery. She appeared to have some intermittent delirium during the hospitalization, not uncommonly seen in patients with cognitive impairment during acute hospitalizations. She reportedly has improved this morning. During my evaluation she had  appropriate mental status, was present and following commands, oriented, and grossly nonfocal neurological exam. To exclude any significant intracranial pathology, recommend obtaining CT of the brain as we do not have any brain imaging in the computer system. If the CT brain is unremarkable for any acute pathology, then no other additional neurodiagnostic testing indicated at this time. Although she does have some mild sundowning with confusion, no significant agitation is described by the nursing staff.  If there is significant agitation noted, consider using a small dose of Seroquel 12.5-25 mg when necessary in the evenings to help with this. We'll sign off after reviewing the CT brain images. Please call for any further questions.  Discussed with Cecelia Byars, physician assistant in the primary surgical team.

## 2016-03-19 NOTE — Evaluation (Addendum)
Clinical/Bedside Swallow Evaluation Patient Details  Name: Sandra Hamilton MRN: [id number] Date of Birth: [birth year]  Today's Date: 03/19/2016 Time: SLP Start Time (ACUTE ONLY): 0818 SLP Stop Time (ACUTE ONLY): 0835 SLP Time Calculation (min) (ACUTE ONLY): 1422 min  Past Medical History:  Past Medical History  Diagnosis Date  . Hypertension   . Irritable bowel syndrome   . Arthritis    Past Surgical History:  Past Surgical History  Procedure Laterality Date  . Abdominal hysterectomy    . Appendectomy    . Thyroidectomy    . Breast biopsy Left   . Cataract extraction w/phaco Right 11/12/2015    Procedure: CATARACT EXTRACTION PHACO AND INTRAOCULAR LENS PLACEMENT RIGHT EYE;  Surgeon: Tonny Branch, MD;  Location: [facility];  Service: Ophthalmology;  Laterality: Right;  CDE 7.40  . Cataract extraction w/phaco Left 12/28/2015    Procedure: CATARACT EXTRACTION PHACO AND INTRAOCULAR LENS PLACEMENT LEFT EYE CDE=4.79;  Surgeon: Tonny Branch, MD;  Location: [facility];  Service: Ophthalmology;  Laterality: Left;  . Abdominal surgery Bilateral 15 Mar 2016  . Colonoscopy  2008  . Eye surgery Bilateral march 2017    cataracts removed  . Laparotomy N/A 03/15/2016    Procedure: EXPLORATORY LAPAROTOMY, CLOSURE OF PYLORIC ULCER ;  Surgeon: Johnathan Hausen, MD;  Location: [place];  Service: General;  Laterality: N/A;   HPI:  80 yo female who presents with one month of gradual weakness and difficulty with PO intake andd per MD states that whenever she tried to eat, she would throw up.Per chart pt  presented to [facility] with severe abdominal pain and increasing weakness. Pt found to have free air consistent with perforated gastric ulcer.Underwent exploratory lap with closure of perforated ulcer. PMH: dementia, IBS, HTN. PA's note stated he wanted to make sure she was swallowing normally. No reported difficulty prior.    Assessment / Plan / Recommendation Clinical Impression  Pt presents with a normal  oropharyngeal swallow without indications of aspiration. She denies previous swallow difficulty. May use straws and instructed her on small sips due to increase velocity. She is at higher risk for dysphagia due to dementia. Pt can tolerate regular textures, thin liquids. Spoke with PA who would like full liquids due to recent surgical intervention. No f/u needed.    Aspiration Risk  Mild aspiration risk    Diet Recommendation  (full liquids per PA. Regular/thin from swallow standpoint)   Liquid Administration via: Cup;Straw Medication Administration: Whole meds with liquid Supervision: Patient able to self feed Compensations: Slow rate;Small sips/bites Postural Changes: Seated upright at 90 degrees    Other  Recommendations Oral Care Recommendations: Oral care BID   Follow up Recommendations  None    Frequency and Duration            Prognosis        Swallow Study   General HPI: 80 yo female who presents with one month of gradual weakness and difficulty with PO intake andd per MD states that whenever she tried to eat, she would throw up.Per chart pt  presented to [facility] with severe abdominal pain and increasing weakness. Pt found to have free air consistent with perforated gastric ulcer.Underwent exploratory lap with closure of perforated ulcer. PMH: dementia, IBS, HTN. PA's note stated he wanted to make sure she was swallowing normally. No reported difficulty prior.  Type of Study: Bedside Swallow Evaluation Previous Swallow Assessment: none Diet Prior to this Study: Thin liquids (clear liquids) Temperature Spikes Noted: No Respiratory  Status: Room air History of Recent Intubation: Yes (for surgery only) Length of Intubations (days):  (hours- for surgery) Date extubated: 03/15/16 Behavior/Cognition: Alert;Cooperative;Pleasant mood;Requires cueing Oral Cavity Assessment: Within Functional Limits (appears to have lingual thrush) Oral Care Completed by SLP: No Oral  Cavity - Dentition: Adequate natural dentition Vision: Functional for self-feeding Self-Feeding Abilities: Able to feed self Patient Positioning: Upright in chair Baseline Vocal Quality: Normal Volitional Cough: Strong Volitional Swallow: Unable to elicit    Oral/Motor/Sensory Function Overall Oral Motor/Sensory Function: Within functional limits   Ice Chips Ice chips: Not tested   Thin Liquid Thin Liquid: Within functional limits Presentation: Cup;Straw    Nectar Thick Nectar Thick Liquid: Not tested   Honey Thick Honey Thick Liquid: Not tested   Puree Puree: Within functional limits   Solid   GO   Solid: Within functional limits        Houston Siren 03/19/2016,9:24 AM  Orbie Pyo Colvin Caroli.Ed [employer] [phone]

## 2016-03-20 MED ORDER — PSYLLIUM 95 % PO PACK
1.0000 | PACK | Freq: Every day | ORAL | Status: DC
  Administered 2016-03-20 – 2016-03-22 (×3): 1 via ORAL
  Filled 2016-03-20 (×3): qty 1

## 2016-03-20 MED ORDER — ACETAMINOPHEN 325 MG PO TABS
650.0000 mg | ORAL_TABLET | Freq: Four times a day (QID) | ORAL | Status: DC
  Administered 2016-03-20 – 2016-03-22 (×9): 650 mg via ORAL
  Filled 2016-03-20 (×9): qty 2

## 2016-03-20 NOTE — Progress Notes (Signed)
5 Days Post-Op  Subjective: Says she isn't hungry so not eating much this AM.  No issues with PO yesterday.  Mentation remains pretty clear this AM.  Objective: Vital signs in last 24 hours: Temp:  [97.6 F (36.4 C)-98 F (36.7 C)] 97.9 F (36.6 C) (06/04 0818) Pulse Rate:  [77-87] 77 (06/04 0818) Resp:  [14-24] 14 (06/04 0818) BP: (108-143)/(64-94) 108/73 mmHg (06/04 0818) SpO2:  [98 %-100 %] 98 % (06/04 0818) Last BM Date: 03/18/16 720 PO 2475 recorded Afebrile, VSS No labs  Intake/Output from previous day: 06/03 0701 - 06/04 0700 In: 2095.8 [P.O.:720; I.V.:1275.8; IV Piggyback:100] Out: [id number] [Urine:2475] Intake/Output this shift:    General appearance: alert, cooperative and no distress Resp: clear to auscultation bilaterally GI: soft, no real complaints of pain, sites OK.  +BS, no BM  Lab Results:  No results for input(s): WBC, HGB, HCT, PLT in the last 72 hours.  BMET No results for input(s): NA, K, CL, CO2, GLUCOSE, BUN, CREATININE, CALCIUM in the last 72 hours. PT/INR No results for input(s): LABPROT, INR in the last 72 hours.   Recent Labs Lab 03/15/16 0322  AST 18  ALT 14  ALKPHOS 93  BILITOT 1.1  PROT 5.7*  ALBUMIN 3.2*     Lipase     Component Value Date/Time   LIPASE 136* 03/15/2016 0322     Studies/Results: Ct Head Wo Contrast  03/19/2016  CLINICAL DATA:  Altered mental status.  Intermittent confusion. EXAM: CT HEAD WITHOUT CONTRAST TECHNIQUE: Contiguous axial images were obtained from the base of the skull through the vertex without intravenous contrast. COMPARISON:  None. FINDINGS: Mild periventricular and scattered subcortical Belson matter hypoattenuation is evident bilaterally. No acute or focal cortical infarct is present. The basal ganglia are intact. Insular ribbon is normal. The ventricles are proportionate to the degree of atrophy. No significant extra-axial fluid collection is present. IMPRESSION: 1. Mild atrophy and diffuse Townley  matter disease. This is nonspecific, but likely reflects the sequela of chronic microvascular ischemia. 2. No acute infarct. Electronically Signed   By: San Morelle M.D.   On: 03/19/2016 14:23    Medications: . diatrizoate meglumine-sodium  120 mL Oral Once  . enoxaparin (LOVENOX) injection  30 mg Subcutaneous Q24H  . pantoprazole  40 mg Oral BID  . piperacillin-tazobactam (ZOSYN)  IV  3.375 g Intravenous Q8H  . saccharomyces boulardii  250 mg Oral BID    Assessment/Plan Perforated gastric ulcer Exploratory laparotomy, closure of perforated ulcer with Phillip Heal patch, 03/15/16, Dr. Donnie Mesa Hx of hypertension Hx of dementia Decubitus stage 4 (from Home) IBS Arthritis FEN: NPO/IV fluids/NG pulled out last PM by pt. ID: Day 6 Zosyn (H Pylori negative) DVT: Lovenox  Plan:  Soft diet, I think we can move her to the floor.  I am going to give her tylenol for pain q6h because she isn't getting anything PRN       LOS: 5 days    Renny Remer 03/20/2016 [phone]

## 2016-03-21 LAB — COMPREHENSIVE METABOLIC PANEL
ALBUMIN: 2 g/dL — AB (ref 3.5–5.0)
ALT: 10 U/L — ABNORMAL LOW (ref 14–54)
AST: 12 U/L — AB (ref 15–41)
Alkaline Phosphatase: 52 U/L (ref 38–126)
Anion gap: 7 (ref 5–15)
CHLORIDE: 107 mmol/L (ref 101–111)
CO2: 27 mmol/L (ref 22–32)
Calcium: 7.9 mg/dL — ABNORMAL LOW (ref 8.9–10.3)
Creatinine, Ser: 0.68 mg/dL (ref 0.44–1.00)
GFR calc Af Amer: >60 mL/min (ref >60)
Glucose, Bld: 85 mg/dL (ref 65–99)
POTASSIUM: 3.3 mmol/L — AB (ref 3.5–5.1)
SODIUM: 141 mmol/L (ref 135–145)
Total Bilirubin: 0.5 mg/dL (ref 0.3–1.2)
Total Protein: 4.4 g/dL — ABNORMAL LOW (ref 6.5–8.1)

## 2016-03-21 LAB — CBC
HCT: 30.7 % — ABNORMAL LOW (ref 36.0–46.0)
Hemoglobin: 9.7 g/dL — ABNORMAL LOW (ref 12.0–15.0)
MCH: 26.5 pg (ref 26.0–34.0)
MCHC: 31.6 g/dL (ref 30.0–36.0)
MCV: 83.9 fL (ref 78.0–100.0)
PLATELETS: 253 10*3/uL (ref 150–400)
RBC: 3.66 MIL/uL — ABNORMAL LOW (ref 3.87–5.11)
RDW: 15.8 % — AB (ref 11.5–15.5)
WBC: 7.5 10*3/uL (ref 4.0–10.5)

## 2016-03-21 MED ORDER — ENSURE ENLIVE PO LIQD
237.0000 mL | Freq: Three times a day (TID) | ORAL | Status: DC
  Administered 2016-03-21 – 2016-03-22 (×4): 237 mL via ORAL

## 2016-03-21 MED ORDER — ADULT MULTIVITAMIN W/MINERALS CH
1.0000 | ORAL_TABLET | Freq: Every day | ORAL | Status: DC
  Administered 2016-03-21 – 2016-03-22 (×2): 1 via ORAL
  Filled 2016-03-21 (×2): qty 1

## 2016-03-21 NOTE — Progress Notes (Signed)
Pt transferred to the unit. Pt is stable, alert and oriented per baseline. Oriented to room, staff, and call bell. Educated to call for any assistance. Bed in lowest position, call bell within reach- will continue to monitor. 

## 2016-03-21 NOTE — Progress Notes (Signed)
Occupational Therapy Treatment Patient Details Name: Sandra Hamilton MRN: [id number] DOB: 1936-01-21 Today's Date: 03/21/2016    History of present illness This is a frail 80 yo female who presents with one month of gradual weakness and difficulty with PO intake. She states that whenever she tried to eat, she would throw up. She presented to [place] with severe abdominal pain and increasing weakness. She was found to have free air consistent with perforated gastric ulcer. The surgeon covering [place] (Dr. Donne Hazel) was called and he recommended transfer.Closure of perforated ulcer surgically.    OT comments  Pt making steady progress. Ambulating to sink @ RW level to complete ADL task in standing position for 7-10 min with stable vitals. Pt very motivated to return to PLOF. Continue to recommend 24/7 S at D/C and home health OT.   Follow Up Recommendations  Home health OT;Supervision/Assistance - 24 hour    Equipment Recommendations  None recommended by OT    Recommendations for Other Services      Precautions / Restrictions Precautions Precautions: Fall Restrictions Weight Bearing Restrictions: No       Mobility Bed Mobility Overal bed mobility: Needs Assistance Bed Mobility: Supine to Sit     Supine to sit: Supervision;HOB elevated        Transfers Overall transfer level: Needs assistance Equipment used: Rolling walker (2 wheeled)   Sit to Stand: Min assist         General transfer comment: vc to not pull up on RW    Balance Overall balance assessment: Needs assistance   Sitting balance-Leahy Scale: Fair       Standing balance-Leahy Scale: Poor                     ADL Overall ADL's : Needs assistance/impaired     Grooming: Set up;Standing   Upper Body Bathing: Min guard;Standing   Lower Body Bathing: Min guard;Sit to/from stand   Upper Body Dressing : Set up;Sitting       Toilet Transfer: Minimal  assistance;BSC Toilet Transfer Details (indicate cue type and reason): Increased assistance without RW. Fall risk Toileting- Water quality scientist and Hygiene: Min guard;Sit to/from stand       Functional mobility during ADLs: Minimal assistance;Rolling walker;Cueing for safety General ADL Comments: Stood at sink to [employer] ADL @ 7 min      Vision                     Perception     Praxis      Cognition   Behavior During Therapy: [facility] for tasks assessed/performed Overall Cognitive Status: History of cognitive impairments - at baseline                       Extremity/Trunk Assessment               Exercises     Shoulder Instructions       General Comments      Pertinent Vitals/ Pain       Pain Assessment: No/denies pain  Home Living                                          Prior Functioning/Environment              Frequency Min 2X/week     Progress Toward Goals  OT Goals(current goals can now be found in the care plan section)  Progress towards OT goals: Progressing toward goals  Acute Rehab OT Goals Patient Stated Goal: to get better OT Goal Formulation: With patient Time For Goal Achievement: 03/23/16 Potential to Achieve Goals: Good ADL Goals Pt Will Perform Grooming: with set-up;standing;with caregiver independent in assisting Pt Will Perform Lower Body Bathing: with supervision;with caregiver independent in assisting;sit to/from stand Pt Will Perform Lower Body Dressing: with supervision;with caregiver independent in assisting;sit to/from stand Pt Will Transfer to Toilet: with supervision;ambulating;regular height toilet Pt Will Perform Toileting - Clothing Manipulation and hygiene: with supervision;sitting/lateral leans;with caregiver independent in assisting  Plan Discharge plan remains appropriate    Co-evaluation                 End of Session Equipment Utilized During Treatment: Gait  belt;Rolling walker   Activity Tolerance Patient tolerated treatment well   Patient Left in chair;with call bell/phone within reach;with chair alarm set   Nurse Communication Mobility status        Time: [id number] OT Time Calculation (min): 32 min  Charges: OT General Charges $OT Visit: 1 Procedure OT Treatments $Self Care/Home Management : 23-37 mins  Larraine Argo,HILLARY 03/21/2016, 10:15 AM   Maurie Boettcher, OTR/L  [phone] 03/21/2016

## 2016-03-21 NOTE — Progress Notes (Signed)
Initial Nutrition Assessment  DOCUMENTATION CODES:   Not applicable  INTERVENTION:   -Continue Ensure Enlive po TID, each supplement provides 350 kcal and 20 grams of protein -MVI daily  NUTRITION DIAGNOSIS:   Increased nutrient needs related to wound healing as evidenced by estimated needs.  GOAL:   Patient will meet greater than or equal to 90% of their needs  MONITOR:   PO intake, Supplement acceptance, Labs, Weight trends, Skin, I & O's  REASON FOR ASSESSMENT:   Consult Wound healing  ASSESSMENT:   This is a frail 80 yo female who presents with one month of gradual weakness and difficulty with PO intake. She states that whenever she tried to eat, she would throw up. She presented to [facility] with severe abdominal pain and increasing weakness. She was found to have free air consistent with perforated gastric ulcer. The surgeon covering [place] (Dr. Donne Hazel) was called and he recommended transfer. She was given Invanz prior to transfer.  Pt admitted with perforated viscous and gastric ulcer.   S/p Procedure(s) (LRB) 03/15/16: EXPLORATORY LAPAROTOMY, CLOSURE OF PYLORIC ULCER (N/A)  Hx obtained from pt and pt daughter at bedside. Both confirm pt generally has a good appetite. Pt daughter shares with this RD that pt had a distant hx of weight loss in 2004/01/20, when pt husband passed away. Noted wt stability over the past year.   Per pt daughter, pt intake has poor the day when she transferred to surgical floor, as pt tends to get confused with room changes. Pt ate "a good lunch" earlier today. Noted meal completion 50-100%. Pt reports she is also consuming Ensure supplements that have been ordered. Pt consumes both Ensure supplements and high protein snacks at home to assist with wound healing.   Reviewed importance of good meal and protein intake to help facilitate wound healing. Pt and daughter very knowledgeable. They deny any further nutritional needs,  but express appreciation for visit.   Reviewed CWOCN note on 03/15/16; pt with stage IV on sacrum. Per pt and pt daughter, pt is followed by wound care center at [facility] She also has a home health nurse that assists with dressing changes.   Nutrition-Focused physical exam completed. Findings are mild to moderate fat depletion, mild to moderate muscle depletion, and no edema. It is difficult to determine whether fat or muscle depletion is related to advanced age, limited mobility, and/or poor nutritional status, but RD suspects it is multifactorial.  Labs reviewed.   Diet Order:  Diet regular Room service appropriate?: Yes; Fluid consistency:: Thin  Skin:  Wound (see comment) (closed abdominal incision, stage I sacrum, stage IV coccyx)  Last BM:  03/20/16  Height:   Ht Readings from Last 1 Encounters:  03/16/16 5\' 2"  (1.575 m)    Weight:   Wt Readings from Last 1 Encounters:  03/16/16 101 lb 10.1 oz (46.1 kg)    Ideal Body Weight:  50 kg  BMI:  Body mass index is 18.58 kg/(m^2).  Estimated Nutritional Needs:   Kcal:  1400-1600  Protein:  65-80 grams  Fluid:  1.4-1.6 L  EDUCATION NEEDS:   Education needs addressed  Lauralye Kinn A. Jimmye Norman, RD, LDN, CDE Pager: [phone] After hours Pager: [phone]

## 2016-03-21 NOTE — Care Management Note (Addendum)
Case Management Note  Patient Details  Name: Sandra Hamilton MRN: [id number] Date of Birth: 06-07-1936  Subjective/Objective:                    Action/Plan:  Kathi Simpers [phone] to discuss discharge plan . PT recommending 24 hour supervision . Await call back . Will need home health orders with wound instructions .  Avel Peace returned call she will be staying with her mother at address in [facility] , 24 hours a day . She would like [facility] again . Referral given to [facility] . Patient already has walker , needs 3 in 1 . Will order. Daughter plans on transporting her mother home via private car at discharge and is requesting nurse to call her when patient is ready for discharge . Expected discharge is 03-22-16 , daughter aware. Daughter also aware 3 in 1 will be delivered to patient's hospital room tomorrow before discharge.  Ordered HHRN, PT, OT, and aide and 3 in 1  Expected Discharge Date:   (pending)               Expected Discharge Plan:  [place]  In-House Referral:     Discharge planning Services  CM Consult  Post Acute Care Choice:  Resumption of Svcs/PTA Provider Choice offered to:     DME Arranged:    DME Agency:     HH Arranged:  RN, PT, OT HH Agency:     Status of Service:  In process, will continue to follow  Medicare Important Message Given:    Date Medicare IM Given:    Medicare IM give by:    Date Additional Medicare IM Given:    Additional Medicare Important Message give by:     If discussed at [place] of Stay Meetings, dates discussed:    Additional Comments:  Marilu Favre, RN 03/21/2016, 11:36 AM

## 2016-03-21 NOTE — Discharge Instructions (Signed)
[facility]      [facility] °[phone] ° °OPEN ABDOMINAL SURGERY: POST OP INSTRUCTIONS ° °Always review your discharge instruction sheet given to you by the facility where your surgery was performed. ° °IF YOU HAVE DISABILITY OR FAMILY LEAVE FORMS, YOU MUST BRING THEM TO THE OFFICE FOR PROCESSING.  PLEASE DO NOT GIVE THEM TO YOUR DOCTOR. ° °1. A prescription for pain medication may be given to you upon discharge.  Take your pain medication as prescribed, if needed.  If narcotic pain medicine is not needed, then you may take acetaminophen (Tylenol) or ibuprofen (Advil) as needed. °2. Take your usually prescribed medications unless otherwise directed. °3. If you need a refill on your pain medication, please contact your pharmacy. They will contact our office to request authorization.  Prescriptions will not be filled after 5pm or on week-ends. °4. You should follow a light diet the first few days after arrival home, such as soup and crackers, pudding, etc.unless your doctor has advised otherwise. A high-fiber, low fat diet can be resumed as tolerated.   Be sure to include lots of fluids daily. Most patients will experience some swelling and bruising on the chest and neck area.  Ice packs will help.  Swelling and bruising can take several days to resolve °5. Most patients will experience some swelling and bruising in the area of the incision. Ice pack will help. Swelling and bruising can take several days to resolve..  °6. It is common to experience some constipation if taking pain medication after surgery.  Increasing fluid intake and taking a stool softener will usually help or prevent this problem from occurring.  A mild laxative (Milk of Magnesia or Miralax) should be taken according to package directions if there are no bowel movements after 48 hours. °7.  You may have steri-strips (small skin tapes) in place directly over the incision.  These strips should be left on the skin for 7-10 days.  If your  surgeon used skin glue on the incision, you may shower in 24 hours.  The glue will flake off over the next 2-3 weeks.  Any sutures or staples will be removed at the office during your follow-up visit. You may find that a light gauze bandage over your incision may keep your staples from being rubbed or pulled. You may shower and replace the bandage daily. °8. ACTIVITIES:  You may resume regular (light) daily activities beginning the next day--such as daily self-care, walking, climbing stairs--gradually increasing activities as tolerated.  You may have sexual intercourse when it is comfortable.  Refrain from any heavy lifting or straining until approved by your doctor. °a. You may drive when you no longer are taking prescription pain medication, you can comfortably wear a seatbelt, and you can safely maneuver your car and apply brakes °b. Return to Work: ___________________________________ °9. You should see your doctor in the office for a follow-up appointment approximately two weeks after your surgery.  Make sure that you call for this appointment within a day or two after you arrive home to insure a convenient appointment time. °OTHER INSTRUCTIONS:  °_____________________________________________________________ °_____________________________________________________________ ° °WHEN TO CALL YOUR DOCTOR: °1. Fever over 101.0 °2. Inability to urinate °3. Nausea and/or vomiting °4. Extreme swelling or bruising °5. Continued bleeding from incision. °6. Increased pain, redness, or drainage from the incision. °7. Difficulty swallowing or breathing °8. Muscle cramping or spasms. °9. Numbness or tingling in hands or feet or around lips. ° °The clinic staff is available to   answer your questions during regular business hours.  Please dont hesitate to call and ask to speak to one of the nurses if you have concerns.  For further questions, please visit [email or web address] Pressure Injury A pressure injury,  sometimes called a bedsore, is an injury to the skin and underlying tissue caused by pressure. Pressure on blood vessels causes decreased blood flow to the skin, which can eventually cause the skin tissue to die and break down into a wound. Pressure injuries usually occur:  Over bony parts of the body such as the tailbone, shoulders, elbows, hips, and heels.  Under medical devices such as respiratory equipment, stockings, tubes, and splints. Pressure injuries start as reddened areas on the skin and can lead to pain, muscle damage, and infection. Pressure injuries can vary in severity.  CAUSES Pressure injuries are caused by a lack of blood supply to an area of skin. They can occur from intense pressure over a short period of time or from less intense pressure over a long period of time. RISK FACTORS This condition is more likely to develop in people who:  Are in the hospital or an extended care facility.  Are bedridden or in a wheelchair.  Have an injury or disease that keeps them from:  Moving normally.  Feeling pain or pressure.  Have a condition that:  Makes them sleepy or less alert.  Causes poor blood flow.  Need to wear a medical device.  Have poor control of their bladder or bowel functions (incontinence).  Have poor nutrition (malnutrition).  Are of certain ethnicities. People of African American and Latino or Hispanic descent are at higher risk compared to other ethnic groups. If you are at risk for pressure ulcers, your health care provider may recommend certain types of bedding to help prevent them. These may include foam or gel mattresses covered with one of the following:  A sheepskin blanket.  A pad that is filled with gel, air, water, or foam. SYMPTOMS  The main symptom is a blister or change in skin color that opens into a wound. Other symptoms include:   Red or dark areas of skin that do not turn [place] or pale when pressed with a finger.   Pain, warmth,  or change of skin texture.  DIAGNOSIS This condition is diagnosed with a medical history and physical exam. You may also have tests, including:   Blood tests to check for infection or signs of poor nutrition.  Imaging studies to check for damage to the deep tissues under your skin.  Blood flow studies. Your pressure injury will be staged to determine its severity. Staging is an assessment of:  The depth of the pressure injury.  Which tissues are exposed because of the pressure injury.  The causes of the pressure injury. TREATMENT The main focus of treatment is to help your injury heal. This may be done by:   Relieving or redistributing pressure on your skin. This includes:  Frequently changing your position.  Eliminating or minimizing positions that caused the wound or that can make the wound worse.  Using specific bed mattresses and chair cushions.  Refitting, resizing, or replacing any medical devices, or padding the skin under them.  Using creams or powders to prevent rubbing (friction) on the skin.  Keeping your skin clean and dry. This may include using a skin cleanser or skin protectant as told by your health care provider. This may be a lotion, ointment, or spray.  Cleaning your injury and removing any  dead tissue from the wound (debridement).  Placing a bandage (dressing) over your injury.  Preventing or treating infection. This may include antibiotic, antimicrobial, or antiseptic medicines. Treatment may also include medicine for pain. Sometimes surgery is needed to close the wound with a flap of healthy skin or a piece of skin from another area of your body (graft). You may need surgery if other treatments are not working or if your injury is very deep. HOME CARE INSTRUCTIONS Wound Care  Follow instructions from your health care provider about:  How to take care of your wound.  When and how you should change your dressing.  When you should remove your  dressing. If your dressing is dry and stuck when you try to remove it, moisten or wet the dressing with saline or water so that it can be removed without harming your skin or wound tissue.  Check your wound every day for signs of infection. Have a caregiver do this for you if you are not able. Watch for:  More redness, swelling, or pain.  More fluid, blood, or pus.  A bad smell. Skin Care  Keep your skin clean and dry. Gently pat your skin dry.  Do not rub or massage your skin.  Use a skin protectant only as told by your health care provider.  Check your skin every day for any changes in color or any new blisters or sores (ulcers). Have a caregiver do this for you if you are not able. Medicines  Take over-the-counter and prescription medicines only as told by your health care provider.  If you were prescribed an antibiotic medicine, take it or apply it as told by your health care provider. Do not stop taking or using the antibiotic even if your condition improves. Reducing and Redistributing Pressure  Do not lie or sit in one position for a long time. Move or change position every two hours or as told by your health care provider.  Use pillows or cushions to reduce pressure. Ask your health care provider to recommend cushions or pads for you.  Use medical devices that do not rub your skin. Tell your health care provider if one of your medical devices is causing a pressure injury to develop. General Instructions  Eat a healthy diet that includes lots of protein. Ask your health care provider for diet advice.  Drink enough fluid to keep your urine clear or pale yellow.  Be as active as you can every day. Ask your health care provider to suggest safe exercises or activities.  Do not abuse drugs or alcohol.  Keep all follow-up visits as told by your health care provider. This is important.  Do not smoke. SEEK MEDICAL CARE IF:  You have chills or fever.  Your pain medicine  is not helping.  You have any changes in skin color.  You have new blisters or sores.  You develop warmth, redness, or swelling near a pressure injury.  You have a bad odor or pus coming from your pressure injury.  You lose control of your bowels or bladder.  You develop new symptoms.  Your wound does not improve after 1-2 weeks of treatment.  You develop a new medical condition, such as diabetes, peripheral vascular disease, or conditions that affect your defense (immune) system.   This information is not intended to replace advice given to you by your health care provider. Make sure you discuss any questions you have with your health care provider.   Document Released:  10/03/2005 Document Revised: 06/24/2015 Document Reviewed: 02/11/2015 Elsevier Interactive Patient Education [employer].

## 2016-03-21 NOTE — Discharge Summary (Signed)
Physician Discharge Summary  Patient ID: Sandra Hamilton MRN: [id number] DOB/AGE: [birth year] 80 y.o.  Admit date: 03/15/2016 Discharge date: 03/22/2016  Admission Diagnoses:  Perforated gastric ulcer with free air Hypertension Dementia Hx of IBS Hx of arthritis   Discharge Diagnoses:  Same  Active Problems:   Perforated gastric ulcer ([place])  Consults:  Dr. Maisie Fus, MD  PROCEDURES: Exploratory laparotomy, closure of perforated ulcer with Phillip Heal patch, 03/15/16, Dr. Lynn Ito Course:  This is a frail 80 yo female who presents with one month of gradual weakness and difficulty with PO intake. She states that whenever she tried to eat, she would throw up. She presented to [place] with severe abdominal pain and increasing weakness. She was found to have free air consistent with perforated gastric ulcer. The surgeon covering [place] (Dr. Donne Hazel) was called and he recommended transfer. She was given Invanz prior to transfer. She was recently found to have a decubitus ulcer and is being treated by the wound care center at [facility].  The patient wants to be a full code. Pt was taken to the OR by Dr. Hassell Done and underwent the above noted procedure.  She tolerated it well and was transferred to SDU bed. Post op she has done well.  Her dementia seemed a bit worse post op and we got a neurology consult at the families request.  She was having more of a Sundowner's reaction than significant dementia.  She got a UGI ON 03/18/16, which showed no extravasation of her contrast.  Her diet was advanced and she was tolerating a regular diet at discharge. Physical therapy recommended 24-hour supervision at discharge which the family could provide. She was discharged home in good condition.   Disposition: 01-Home or Self Care     Medication List    STOP taking these medications        doxycycline 100 MG capsule  Commonly known as:  VIBRAMYCIN     ibuprofen 200 MG tablet  Commonly known as:  ADVIL,MOTRIN     meloxicam 15 MG tablet  Commonly known as:  MOBIC     sulfamethoxazole-trimethoprim 800-160 MG tablet  Commonly known as:  BACTRIM DS,SEPTRA DS      TAKE these medications        amLODipine 5 MG tablet  Commonly known as:  NORVASC  TAKE 1 TABLET (5 MG TOTAL) BY MOUTH DAILY.     calcium carbonate 600 MG Tabs tablet  Commonly known as:  OS-CAL  Take 600 mg by mouth daily with breakfast.     donepezil 5 MG tablet  Commonly known as:  ARICEPT  Take 1 tablet (5 mg total) by mouth at bedtime.     ENSURE HIGH PROTEIN Liqd  Drink 2 -4 cans daily     Fish Oil 1200 MG Caps  Take 1 capsule by mouth 2 (two) times daily.     fluticasone 50 MCG/ACT nasal spray  Commonly known as:  FLONASE  Place 2 sprays into both nostrils daily.     OSTEO BI-FLEX JOINT SHIELD PO  Apply 1 application topically as needed (pain).     pantoprazole 40 MG tablet  Commonly known as:  PROTONIX  Take 1 tablet (40 mg total) by mouth 2 (two) times daily.     traMADol 50 MG tablet  Commonly known as:  ULTRAM  Take 1-2 tablets (50-100 mg total) by mouth every 6 (six) hours as needed for moderate pain or severe pain.  Follow-up Information    Follow up with [facility].   Specialty:  Home Health Services   Why:  Resume HHRN, [place], [place] information:   [address] [place] [address] [phone]       Follow up with Claretta Fraise, MD.   Specialty:  Family Medicine   Why:  Call for follow up and medcial care 1-2 weeks   Contact information:   [address] [place] [address] [phone]       Follow up with Pedro Earls, MD.   Specialty:  General Surgery   Why:  Call and make an appointment in 2 weeks.   Contact information:   [address] [place] [address] [phone]       Signed: Earnstine Regal 03/21/2016, 11:53 AM Lisette Abu, PA-C Pager:  [phone]

## 2016-03-21 NOTE — Progress Notes (Signed)
6 Days Post-Op  Subjective: Abdominal site looks good, she is tolerating soft diet.  Decubitus looks fine.    Objective: Vital signs in last 24 hours: Temp:  [97.3 F (36.3 C)-97.9 F (36.6 C)] 97.7 F (36.5 C) (06/05 0732) Pulse Rate:  [76-82] 76 (06/04 2049) Resp:  [13-27] 13 (06/05 0732) BP: (95-129)/(50-83) 120/66 mmHg (06/05 0732) SpO2:  [96 %-100 %] 98 % (06/05 0732) Last BM Date: 03/20/16  Intake/Output from previous day: 06/04 0701 - 06/05 0700 In: 2260 [P.O.:960; I.V.:1200; IV Piggyback:100] Out: 1200 [Urine:1200] Intake/Output this shift: Total I/O In: 120 [P.O.:120] Out: -   General appearance: alert, cooperative and no distress Resp: clear to auscultation bilaterally GI: soft, + BS, site looks fine + BM  Lab Results:   Recent Labs  03/21/16 0345  WBC 7.5  HGB 9.7*  HCT 30.7*  PLT 253    BMET  Recent Labs  03/21/16 0345  NA 141  K 3.3*  CL 107  CO2 27  GLUCOSE 85  BUN <5*  CREATININE 0.68  CALCIUM 7.9*   PT/INR No results for input(s): LABPROT, INR in the last 72 hours.   Recent Labs Lab 03/15/16 0322 03/21/16 0345  AST 18 12*  ALT 14 10*  ALKPHOS 93 52  BILITOT 1.1 0.5  PROT 5.7* 4.4*  ALBUMIN 3.2* 2.0*     Lipase     Component Value Date/Time   LIPASE 136* 03/15/2016 0322     Studies/Results: Ct Head Wo Contrast  03/19/2016  CLINICAL DATA:  Altered mental status.  Intermittent confusion. EXAM: CT HEAD WITHOUT CONTRAST TECHNIQUE: Contiguous axial images were obtained from the base of the skull through the vertex without intravenous contrast. COMPARISON:  None. FINDINGS: Mild periventricular and scattered subcortical Macwilliams matter hypoattenuation is evident bilaterally. No acute or focal cortical infarct is present. The basal ganglia are intact. Insular ribbon is normal. The ventricles are proportionate to the degree of atrophy. No significant extra-axial fluid collection is present. IMPRESSION: 1. Mild atrophy and diffuse [place]  matter disease. This is nonspecific, but likely reflects the sequela of chronic microvascular ischemia. 2. No acute infarct. Electronically Signed   By: San Morelle M.D.   On: 03/19/2016 14:23    Medications: . acetaminophen  650 mg Oral Q6H  . diatrizoate meglumine-sodium  120 mL Oral Once  . enoxaparin (LOVENOX) injection  30 mg Subcutaneous Q24H  . pantoprazole  40 mg Oral BID  . piperacillin-tazobactam (ZOSYN)  IV  3.375 g Intravenous Q8H  . psyllium  1 packet Oral Daily  . saccharomyces boulardii  250 mg Oral BID    Assessment/Plan Perforated gastric ulcer Exploratory laparotomy, closure of perforated ulcer with Phillip Heal patch, 03/15/16, Dr. Donnie Mesa Hx of hypertension Hx of dementia Decubitus stage 4 (from Home) IBS Arthritis FEN: NPO/IV fluids/NG pulled out last PM by pt. ID: Day 6 Zosyn (H Pylori negative) DVT: Lovenox    Plan:  Transfer to the floor, planning per Case manager, I think we can send her home when d/c plans are completed.       LOS: 6 days    Aldon Hengst 03/21/2016 [phone]

## 2016-03-22 LAB — CREATININE, SERUM: Creatinine, Ser: 0.64 mg/dL (ref 0.44–1.00)

## 2016-03-22 MED ORDER — TRAMADOL HCL 50 MG PO TABS
50.0000 mg | ORAL_TABLET | Freq: Four times a day (QID) | ORAL | Status: DC | PRN
Start: 2016-03-22 — End: 2016-04-07

## 2016-03-22 MED ORDER — PANTOPRAZOLE SODIUM 40 MG PO TBEC: 40.0000 mg | DELAYED_RELEASE_TABLET | Freq: Two times a day (BID) | ORAL | Status: DC

## 2016-03-22 NOTE — Progress Notes (Signed)
Physical Therapy Treatment Patient Details Name: Sandra Hamilton MRN: [id number] DOB: 1935-11-14 Today's Date: 03/22/2016    History of Present Illness This is a frail 81 yo female who presents with one month of gradual weakness and difficulty with PO intake. She states that whenever she tried to eat, she would throw up. She presented to [facility] with severe abdominal pain and increasing weakness. She was found to have free air consistent with perforated gastric ulcer. The surgeon covering [place] (Dr. Donne Hazel) was called and he recommended transfer.Closure of perforated ulcer surgically.     PT Comments    Pt seen for PT session with focus on ambulation. Decreased distance today due to patient's reports of fatigue. PT to continue to follow and anticipating D/C to home with 24 hour assistance provided by family.   Follow Up Recommendations  Home health PT;Supervision/Assistance - 24 hour     Equipment Recommendations  Rolling walker with 5" wheels;3in1 (PT)    Recommendations for Other Services       Precautions / Restrictions Precautions Precautions: Fall Restrictions Weight Bearing Restrictions: No    Mobility  Bed Mobility Overal bed mobility: Needs Assistance Bed Mobility: Supine to Sit;Sit to Supine     Supine to sit: Supervision;HOB elevated Sit to supine: Min assist (with LEs)      Transfers Overall transfer level: Needs assistance Equipment used: Rolling walker (2 wheeled) Transfers: Sit to/from Stand Sit to Stand: Min assist Stand pivot transfers: Min guard       General transfer comment: no cues needed for hand position  Ambulation/Gait Ambulation/Gait assistance: Min guard Ambulation Distance (Feet): 180 Feet Assistive device: Rolling walker (2 wheeled) Gait Pattern/deviations: Step-through pattern;Decreased step length - right;Decreased step length - left Gait velocity: decreased   General Gait Details: Cues for avoiding  objects in halls and for increasing stride length. Distance limited by pt reports of fatigue.    Stairs            Wheelchair Mobility    Modified Rankin (Stroke Patients Only)       Balance Overall balance assessment: Needs assistance Sitting-balance support: No upper extremity supported Sitting balance-Leahy Scale: Good     Standing balance support: Bilateral upper extremity supported Standing balance-Leahy Scale: Poor Standing balance comment: using rw for support                    Cognition Arousal/Alertness: Awake/alert Behavior During Therapy: WFL for tasks assessed/performed Overall Cognitive Status: History of cognitive impairments - at baseline                      Exercises      General Comments        Pertinent Vitals/Pain Pain Assessment: No/denies pain    Home Living                      Prior Function            PT Goals (current goals can now be found in the care plan section) Acute Rehab PT Goals Patient Stated Goal: be able to walk further PT Goal Formulation: With patient Time For Goal Achievement: 03/30/16 Potential to Achieve Goals: Good Progress towards PT goals: Progressing toward goals    Frequency  Min 3X/week    PT Plan Current plan remains appropriate    Co-evaluation             End of Session Equipment Utilized During  Treatment: Gait belt Activity Tolerance: Patient limited by fatigue Patient left: in bed;with call bell/phone within reach;with bed alarm set     Time: [id number] PT Time Calculation (min) (ACUTE ONLY): 24 min  Charges:  $Gait Training: 23-37 mins                    G Codes:      Cassell Clement, PT, CSCS Pager [phone] Office [phone]  03/22/2016, 10:51 AM

## 2016-03-22 NOTE — Progress Notes (Signed)
Patient ID: Sandra Hamilton, female   DOB: 18-Nov-1935, 80 y.o.   MRN: [id number]   LOS: 7 days   POD#7  Subjective: No c/o. Eating and eliminating without problem.   Objective: Vital signs in last 24 hours: Temp:  [97.3 F (36.3 C)-98.2 F (36.8 C)] 98.2 F (36.8 C) (06/06 1000) Pulse Rate:  [75-86] 75 (06/06 1000) Resp:  [16-18] 17 (06/06 1000) BP: (111-131)/(68-82) 111/73 mmHg (06/06 1000) SpO2:  [96 %-99 %] 98 % (06/06 1000) Last BM Date: 03/20/16   Laboratory  BMET  Recent Labs  03/21/16 0345 03/22/16 0649  NA 141  --   K 3.3*  --   CL 107  --   CO2 27  --   GLUCOSE 85  --   BUN <5*  --   CREATININE 0.68 0.64  CALCIUM 7.9*  --     Physical Exam General appearance: alert and no distress Resp: clear to auscultation bilaterally Cardio: regular rate and rhythm GI: Soft, +BS, incision clean, pale   Assessment/Plan: Perforated gastric ulcer Exploratory laparotomy, closure of perforated ulcer with Phillip Heal patch, 03/15/16, Dr. Donnie Mesa Hx of hypertension Hx of dementia Decubitus stage 4 (from Home) IBS Arthritis FEN: NPO/IV fluids/NG pulled out last PM by pt. ID: Day 6 Zosyn (H Pylori negative) DVT: Lovenox  Plan: Ready for discharge if 24h supervision available. Call made to dtr, left message to call back.    Lisette Abu, PA-C Pager: [phone] 03/22/2016

## 2016-03-22 NOTE — Progress Notes (Signed)
AVS given to patient. IV removed. Understanding verbalized. Transportation with family. Belongings packed. 

## 2016-03-23 ENCOUNTER — Telehealth: Payer: Self-pay | Admitting: Family Medicine

## 2016-03-23 DIAGNOSIS — R531 Weakness: Secondary | ICD-10-CM | POA: Diagnosis not present

## 2016-03-23 DIAGNOSIS — L89154 Pressure ulcer of sacral region, stage 4: Secondary | ICD-10-CM | POA: Diagnosis not present

## 2016-03-24 DIAGNOSIS — R531 Weakness: Secondary | ICD-10-CM | POA: Diagnosis not present

## 2016-03-24 DIAGNOSIS — L89154 Pressure ulcer of sacral region, stage 4: Secondary | ICD-10-CM | POA: Diagnosis not present

## 2016-03-24 NOTE — Telephone Encounter (Signed)
Verbal orders given 03/23/16

## 2016-03-25 DIAGNOSIS — L89154 Pressure ulcer of sacral region, stage 4: Secondary | ICD-10-CM | POA: Diagnosis not present

## 2016-03-25 DIAGNOSIS — R531 Weakness: Secondary | ICD-10-CM | POA: Diagnosis not present

## 2016-03-28 DIAGNOSIS — L89154 Pressure ulcer of sacral region, stage 4: Secondary | ICD-10-CM | POA: Diagnosis not present

## 2016-03-28 DIAGNOSIS — R531 Weakness: Secondary | ICD-10-CM | POA: Diagnosis not present

## 2016-03-30 DIAGNOSIS — R531 Weakness: Secondary | ICD-10-CM | POA: Diagnosis not present

## 2016-03-30 DIAGNOSIS — L89154 Pressure ulcer of sacral region, stage 4: Secondary | ICD-10-CM | POA: Diagnosis not present

## 2016-03-31 DIAGNOSIS — L89154 Pressure ulcer of sacral region, stage 4: Secondary | ICD-10-CM | POA: Diagnosis not present

## 2016-03-31 DIAGNOSIS — R531 Weakness: Secondary | ICD-10-CM | POA: Diagnosis not present

## 2016-04-05 ENCOUNTER — Ambulatory Visit: Payer: Medicare Other | Admitting: Family Medicine

## 2016-04-06 DIAGNOSIS — L89154 Pressure ulcer of sacral region, stage 4: Secondary | ICD-10-CM | POA: Diagnosis not present

## 2016-04-06 DIAGNOSIS — R531 Weakness: Secondary | ICD-10-CM | POA: Diagnosis not present

## 2016-04-07 ENCOUNTER — Ambulatory Visit (INDEPENDENT_AMBULATORY_CARE_PROVIDER_SITE_OTHER): Payer: Medicare Other | Admitting: Family Medicine

## 2016-04-07 ENCOUNTER — Encounter: Payer: Self-pay | Admitting: Family Medicine

## 2016-04-07 VITALS — BP 118/77 | HR 80 | Temp 97.1°F | Ht 62.0 in | Wt 89.2 lb

## 2016-04-07 DIAGNOSIS — L89154 Pressure ulcer of sacral region, stage 4: Secondary | ICD-10-CM | POA: Diagnosis not present

## 2016-04-07 DIAGNOSIS — K255 Chronic or unspecified gastric ulcer with perforation: Secondary | ICD-10-CM | POA: Diagnosis not present

## 2016-04-07 DIAGNOSIS — F0390 Unspecified dementia without behavioral disturbance: Secondary | ICD-10-CM

## 2016-04-07 DIAGNOSIS — R531 Weakness: Secondary | ICD-10-CM | POA: Diagnosis not present

## 2016-04-07 NOTE — Progress Notes (Signed)
Subjective:    Patient ID: Sandra Hamilton, female    DOB: 07/31/1936, 80 y.o.   MRN: [id number]  HPI patient presented to [facility] emergency room with abdominal pain. There was free air under the diaphragm and she was taken to [facility] and explored and treated for ruptured duodenal ulcer. Since a viscous had ruptured she was not closed but left to heal by secondary intention. Since she has gone home about 2 weeks ago she has done well. Weight loss is significant given her frailty prior to the surgery. Appetite is fair. Home health nurse and family have been changing dressings. Discharge Summary Notes    Discharge Summaries by Lisette Abu, PA-C at 03/21/2016 11:53 AM  Version 1 of 1   Author: Lisette Abu, PA-C Service: Surgery Author Type: Physician Assistant Certified   Filed: [id number] 12:47 PM Note Time: 03/21/2016 11:53 AM Status: Attested   Editor: Lisette Abu, PA-C (Physician Assistant Certified) Cosigner: Erroll Luna, MD at 03/22/2016 3:08 PM   Attestation signed by Erroll Luna, MD at 03/22/2016 3:08 PM  READY FOR DISCHARGE IF LOGISTICS SET UP      Expand All Collapse All   Physician Discharge Summary  Patient ID: Sandra Hamilton MRN: [id number] DOB/AGE: 07-Sep-1936 80 y.o.  Admit date: 03/15/2016 Discharge date: 03/22/2016  Admission Diagnoses:  Perforated gastric ulcer with free air Hypertension Dementia Hx of IBS Hx of arthritis   Discharge Diagnoses:  Same  Active Problems:  Perforated gastric ulcer ([place])  Consults: Dr. Maisie Fus, MD  PROCEDURES: Exploratory laparotomy, closure of perforated ulcer with Phillip Heal patch, 03/15/16, Dr. Lynn Ito Course:  This is a frail 80 yo female who presents with one month of gradual weakness and difficulty with PO intake. She states that whenever she tried to eat, she would throw up. She presented to [facility] with severe abdominal pain and increasing weakness.  She was found to have free air consistent with perforated gastric ulcer. The surgeon covering [place] (Dr. Donne Hazel) was called and he recommended transfer. She was given Invanz prior to transfer. She was recently found to have a decubitus ulcer and is being treated by the wound care center at [facility]. The patient wants to be a full code. Pt was taken to the OR by Dr. Hassell Done and underwent the above noted procedure. She tolerated it well and was transferred to SDU bed. Post op she has done well. Her dementia seemed a bit worse post op and we got a neurology consult at the families request. She was having more of a Sundowner's reaction than significant dementia. She got a UGI ON 03/18/16, which showed no extravasation of her contrast. Her diet was advanced and she was tolerating a regular diet at discharge. Physical therapy recommended 24-hour supervision at discharge which the family could provide. She was discharged home in good condition.   Disposition: 01-Home or Self Care     Medication List    STOP taking these medications       doxycycline 100 MG capsule  Commonly known as: VIBRAMYCIN     ibuprofen 200 MG tablet  Commonly known as: ADVIL,MOTRIN     meloxicam 15 MG tablet  Commonly known as: MOBIC     sulfamethoxazole-trimethoprim 800-160 MG tablet  Commonly known as: BACTRIM DS,SEPTRA DS      TAKE these medications       amLODipine 5 MG tablet  Commonly known as: NORVASC  TAKE 1 TABLET (5 MG  TOTAL) BY MOUTH DAILY.     calcium carbonate 600 MG Tabs tablet  Commonly known as: OS-CAL  Take 600 mg by mouth daily with breakfast.     donepezil 5 MG tablet  Commonly known as: ARICEPT  Take 1 tablet (5 mg total) by mouth at bedtime.     ENSURE HIGH PROTEIN Liqd  Drink 2 -4 cans daily     Fish Oil 1200 MG Caps  Take 1 capsule by mouth 2 (two) times daily.     fluticasone 50 MCG/ACT nasal spray  Commonly  known as: FLONASE  Place 2 sprays into both nostrils daily.     OSTEO BI-FLEX JOINT SHIELD PO  Apply 1 application topically as needed (pain).     pantoprazole 40 MG tablet  Commonly known as: PROTONIX  Take 1 tablet (40 mg total) by mouth 2 (two) times daily.     traMADol 50 MG tablet  Commonly known as: ULTRAM  Take 1-2 tablets (50-100 mg total) by mouth every 6 (six) hours as needed for moderate pain or severe pain.       Follow-up Information    Follow up with [facility].   Specialty: Home Health Services   Why: Resume HHRN, [place], [place] information:   [place] [facility] [id number] [phone]       Follow up with Claretta Fraise, MD.   Specialty: Family Medicine   Why: Call for follow up and medcial care 1-2 weeks   Contact information:   [place]  [id number] [phone]       Follow up with Pedro Earls, MD.   Specialty: General Surgery   Why: Call and make an appointment in 2 weeks.   Contact information:   [address] [address] [phone]       Signed: Earnstine Regal 03/21/2016, 11:53 AM Lisette Abu, PA-C Pager: [phone]                   Patient Active Problem List   Diagnosis Date Noted  . Perforated gastric ulcer ([place]) 03/15/2016  . Dementia 03/07/2016  . Generalized edema 03/07/2016  . Essential hypertension 03/07/2016  . Hyperlipidemia with target LDL less than 100 01/05/2015  . Shin splints 01/05/2015  . Hypertension 04/03/2013   Outpatient Encounter Prescriptions as of 04/07/2016  Medication Sig  . amLODipine (NORVASC) 5 MG tablet TAKE 1 TABLET (5 MG TOTAL) BY MOUTH DAILY. (Patient taking differently: Take 2.5 mg by mouth daily. )  . donepezil (ARICEPT) 5 MG tablet Take 1 tablet (5 mg total) by mouth at bedtime.  . fluticasone (FLONASE) 50 MCG/ACT nasal spray Place 2  sprays into both nostrils daily.  . pantoprazole (PROTONIX) 40 MG tablet Take 1 tablet (40 mg total) by mouth 2 (two) times daily.  . calcium carbonate (OS-CAL) 600 MG TABS tablet Take 600 mg by mouth daily with breakfast. Reported on 04/07/2016  . Misc Natural Products (OSTEO BI-FLEX JOINT SHIELD PO) Apply 1 application topically as needed (pain). Reported on 04/07/2016  . Nutritional Supplements (ENSURE HIGH PROTEIN) LIQD Drink 2 -4 cans daily (Patient not taking: Reported on 04/07/2016)  . Omega-3 Fatty Acids (FISH OIL) 1200 MG CAPS Take 1 capsule by mouth 2 (two) times daily. Reported on 04/07/2016  . [DISCONTINUED] traMADol (ULTRAM) 50 MG tablet Take 1-2 tablets (50-100 mg total) by mouth every 6 (six) hours as needed for moderate pain or severe pain.  No facility-administered encounter medications on file as of 04/07/2016.      Review of Systems  Constitutional: Negative.   HENT: Negative.   Respiratory: Negative.   Neurological: Negative.        Objective:   Physical Exam  Constitutional: She appears well-developed and well-nourished.  Cardiovascular: Normal rate.   Pulmonary/Chest: Effort normal and breath sounds normal.  Abdominal: Soft.  Wound is healing nicely without evidence of infection. Patient has follow-up with surgeon next week  Psychiatric:  Patient was repetitive in her speech today and has obvious dementia. She is cared for by her daughter.   BP 118/77 mmHg  Pulse 80  Temp(Src) 97.1 F (36.2 C) (Oral)  Ht 5\' 2"  (1.575 m)  Wt 89 lb 3.2 oz (40.461 kg)  BMI 16.31 kg/m2        Assessment & Plan:  1. Dementia, without behavioral disturbance This is stable. Behaviors are okay  2. Perforated gastric ulcer, chronic or unspecified ([place]) Open wound is healing nicely continue excellent wound care can be sure and follow up surgeon next week  Wardell Honour MD

## 2016-04-12 DIAGNOSIS — R531 Weakness: Secondary | ICD-10-CM | POA: Diagnosis not present

## 2016-04-12 DIAGNOSIS — L89154 Pressure ulcer of sacral region, stage 4: Secondary | ICD-10-CM | POA: Diagnosis not present

## 2016-04-14 DIAGNOSIS — R531 Weakness: Secondary | ICD-10-CM | POA: Diagnosis not present

## 2016-04-14 DIAGNOSIS — L89154 Pressure ulcer of sacral region, stage 4: Secondary | ICD-10-CM | POA: Diagnosis not present

## 2016-04-20 DIAGNOSIS — R531 Weakness: Secondary | ICD-10-CM | POA: Diagnosis not present

## 2016-04-20 DIAGNOSIS — L89154 Pressure ulcer of sacral region, stage 4: Secondary | ICD-10-CM | POA: Diagnosis not present

## 2016-04-25 DIAGNOSIS — R531 Weakness: Secondary | ICD-10-CM | POA: Diagnosis not present

## 2016-04-25 DIAGNOSIS — L89154 Pressure ulcer of sacral region, stage 4: Secondary | ICD-10-CM | POA: Diagnosis not present

## 2016-05-11 ENCOUNTER — Other Ambulatory Visit: Payer: Self-pay | Admitting: Family Medicine

## 2016-06-08 ENCOUNTER — Encounter: Payer: Self-pay | Admitting: Family Medicine

## 2016-06-08 ENCOUNTER — Ambulatory Visit (INDEPENDENT_AMBULATORY_CARE_PROVIDER_SITE_OTHER): Payer: Medicare Other | Admitting: Family Medicine

## 2016-06-08 VITALS — BP 121/72 | HR 90 | Temp 97.2°F | Ht 62.0 in | Wt 93.2 lb

## 2016-06-08 DIAGNOSIS — I10 Essential (primary) hypertension: Secondary | ICD-10-CM | POA: Diagnosis not present

## 2016-06-08 DIAGNOSIS — E785 Hyperlipidemia, unspecified: Secondary | ICD-10-CM

## 2016-06-08 DIAGNOSIS — F0390 Unspecified dementia without behavioral disturbance: Secondary | ICD-10-CM | POA: Diagnosis not present

## 2016-06-08 NOTE — Progress Notes (Signed)
Subjective:  Patient ID: Sandra Hamilton, female    DOB: 03-Oct-1936  Age: 80 y.o. MRN: [id number]  CC: Dizziness (x 2 mths, positional)   HPI Sandra Hamilton presents for Feels out of body at time when she turns her head. Lasts from a few moments to hours. Relief by laying down and closing her eyes. Denies Nausea, vomiting, HA. No LOC.   Pt. States her decubitus has completely healed and overall feels better than she has in a long time.Symptoms above are tolerable. She just needs to know if it serious enough to pursue.  Denies ulcer pain. Currently off med for cholesterol.   History Sandra Hamilton has a past medical history of Arthritis; Hypertension; and Irritable bowel syndrome.   She has a past surgical history that includes Abdominal hysterectomy; Appendectomy; Thyroidectomy; Breast biopsy (Left); Cataract extraction w/PHACO (Right, 11/12/2015); Cataract extraction w/PHACO (Left, 12/28/2015); Abdominal surgery (Bilateral, 15 Mar 2016); Colonoscopy (2008); Eye surgery (Bilateral, march 2017); and laparotomy (N/A, 03/15/2016).   Her family history includes Dementia in her mother; Diabetes in her father; Heart disease in her mother and sister.She reports that she has never smoked. She has never used smokeless tobacco. She reports that she does not drink alcohol or use drugs.    ROS Review of Systems  Constitutional: Negative for activity change, appetite change and fever.  HENT: Negative for congestion, rhinorrhea and sore throat.   Eyes: Negative for visual disturbance.  Respiratory: Negative for cough and shortness of breath.   Cardiovascular: Negative for chest pain and palpitations.  Gastrointestinal: Negative for abdominal pain, diarrhea and nausea.  Genitourinary: Negative for dysuria.  Musculoskeletal: Negative for arthralgias and myalgias.  Neurological: Positive for dizziness and light-headedness. Negative for tremors, seizures, syncope, facial asymmetry, speech difficulty, weakness,  numbness and headaches.  Psychiatric/Behavioral: Positive for confusion (occasional according to daughter (present at exam.)). Negative for agitation.     Medication List       Accurate as of 06/08/16 11:59 PM. Always use your most recent med list.          amLODipine 5 MG tablet Commonly known as:  NORVASC TAKE 1 TABLET (5 MG TOTAL) BY MOUTH DAILY.   calcium carbonate 600 MG Tabs tablet Commonly known as:  OS-CAL Take 600 mg by mouth daily with breakfast. Reported on 04/07/2016   donepezil 5 MG tablet Commonly known as:  ARICEPT TAKE ONE TABLET AT BEDTIME   ENSURE HIGH PROTEIN Liqd Drink 2 -4 cans daily   Fish Oil 1200 MG Caps Take 1 capsule by mouth 2 (two) times daily. Reported on 04/07/2016   fluticasone 50 MCG/ACT nasal spray Commonly known as:  FLONASE Place 2 sprays into both nostrils daily.   OSTEO BI-FLEX JOINT SHIELD PO Apply 1 application topically as needed (pain). Reported on 04/07/2016   pantoprazole 40 MG tablet Commonly known as:  PROTONIX Take 1 tablet (40 mg total) by mouth 2 (two) times daily.       Objective:  BP 121/72 (BP Location: Left Arm, Patient Position: Sitting, Cuff Size: Normal)   Pulse 90   Temp 97.2 F (36.2 C) (Oral)   Ht 5\' 2"  (1.575 m)   Wt 93 lb 3.2 oz (42.3 kg)   SpO2 98%   BMI 17.05 kg/m   BP Readings from Last 3 Encounters:  06/08/16 121/72  04/07/16 118/77  03/22/16 111/73    Wt Readings from Last 3 Encounters:  06/08/16 93 lb 3.2 oz (42.3 kg)  04/07/16 89 lb 3.2 oz (  40.5 kg)  03/16/16 101 lb 10.1 oz (46.1 kg)     Physical Exam  Constitutional: She is oriented to person, place, and time. She appears well-developed and well-nourished. No distress.  HENT:  Head: Normocephalic and atraumatic.  Right Ear: External ear normal.  Left Ear: External ear normal.  Nose: Nose normal.  Mouth/Throat: Oropharynx is clear and moist.  Eyes: Conjunctivae and EOM are normal. Pupils are equal, round, and reactive to light.    Neck: Normal range of motion. Neck supple. No thyromegaly present.  Cardiovascular: Normal rate, regular rhythm and normal heart sounds.   No murmur heard. Pulmonary/Chest: Effort normal and breath sounds normal. No respiratory distress. She has no wheezes. She has no rales.  Abdominal: Soft. Bowel sounds are normal. She exhibits no distension. There is no tenderness.  Lymphadenopathy:    She has no cervical adenopathy.  Neurological: She is alert and oriented to person, place, and time. She has normal reflexes.  Skin: Skin is warm and dry.  Psychiatric: She has a normal mood and affect. Her behavior is normal. Judgment and thought content normal.     Lab Results  Component Value Date   WBC 7.5 03/21/2016   HGB 9.7 (L) 03/21/2016   HCT 30.7 (L) 03/21/2016   PLT 253 03/21/2016   GLUCOSE 85 03/21/2016   CHOL 145 01/05/2015   TRIG 70 01/05/2015   HDL 70 01/05/2015   LDLCALC 126 (H) 05/05/2014   ALT 10 (L) 03/21/2016   AST 12 (L) 03/21/2016   NA 141 03/21/2016   K 3.3 (L) 03/21/2016   CL 107 03/21/2016   CREATININE 0.64 03/22/2016   BUN <5 (L) 03/21/2016   CO2 27 03/21/2016   TSH 3.520 03/07/2016   INR 1.13 03/15/2016    Dg Abd Portable 1v  Result Date: 03/16/2016 CLINICAL DATA:  Nasogastric tube position. EXAM: PORTABLE ABDOMEN - 1 VIEW COMPARISON:  None. FINDINGS: The bowel gas pattern is normal. Nasogastric tube tip is seen in expected position of distal stomach. Severe degenerative changes seen involving both hip joint. IMPRESSION: Nasogastric tube tip seen in expected position of distal stomach. Electronically Signed   By: Marijo Conception, M.D.   On: 03/16/2016 10:13    Assessment & Plan:   Serafina was seen today for dizziness.  Diagnoses and all orders for this visit:  Essential hypertension  Dementia, without behavioral disturbance  Hyperlipidemia with target LDL less than 100      I am having Ms. Burzynski maintain her Fish Oil, calcium carbonate, Misc Natural  Products (OSTEO BI-FLEX JOINT SHIELD PO), fluticasone, amLODipine, ENSURE HIGH PROTEIN, pantoprazole, and donepezil.  No orders of the defined types were placed in this encounter.    Follow-up: Return in about 3 months (around 09/08/2016).  Claretta Fraise, M.D.

## 2016-06-09 ENCOUNTER — Telehealth: Payer: Self-pay | Admitting: Family Medicine

## 2016-06-09 NOTE — Telephone Encounter (Signed)
Called and explained that he wasn't on Hippa, so I could not discuss her care

## 2016-06-25 ENCOUNTER — Other Ambulatory Visit: Payer: Self-pay | Admitting: Family Medicine

## 2016-06-27 ENCOUNTER — Other Ambulatory Visit: Payer: Self-pay | Admitting: Family Medicine

## 2016-06-27 MED ORDER — AMLODIPINE BESYLATE 2.5 MG PO TABS: 2.5000 mg | ORAL_TABLET | Freq: Every day | ORAL | 1 refills | Status: DC

## 2016-06-28 ENCOUNTER — Telehealth: Payer: Self-pay | Admitting: *Deleted

## 2016-06-28 NOTE — Telephone Encounter (Signed)
Please contact the patient. I was called by [facility] yesterday with a request to lower the dose because the patient had been cutting five mg pills in half. As a courtesy I made that change based on information  I received. If she was in fact taking 5 mg every day, if she would like to change back to 5 mg tablet , please send that in.  Thanks, WS

## 2016-06-28 NOTE — Telephone Encounter (Signed)
Spoke with Stu at [facility].  They have been told by patient she is taking 1/2 of a 5 mg tablet.  We sent in both 5 mg and 2.5 mg refills.  [facility] is going to fill 2.5 mg because the patient has told them that is what she is taking.

## 2016-07-11 ENCOUNTER — Other Ambulatory Visit: Payer: Self-pay | Admitting: Family Medicine

## 2016-09-21 ENCOUNTER — Ambulatory Visit (INDEPENDENT_AMBULATORY_CARE_PROVIDER_SITE_OTHER): Payer: Medicare Other | Admitting: Family Medicine

## 2016-09-21 ENCOUNTER — Encounter: Payer: Self-pay | Admitting: Family Medicine

## 2016-09-21 VITALS — BP 129/75 | HR 85 | Temp 97.4°F | Ht 62.0 in | Wt 90.8 lb

## 2016-09-21 DIAGNOSIS — I10 Essential (primary) hypertension: Secondary | ICD-10-CM

## 2016-09-21 DIAGNOSIS — H6121 Impacted cerumen, right ear: Secondary | ICD-10-CM | POA: Diagnosis not present

## 2016-09-21 NOTE — Progress Notes (Signed)
Subjective:  Patient ID: Sandra Hamilton, female    DOB: 12-31-1935  Age: 80 y.o. MRN: [id number]  CC: Ear Pain   HPI Sandra Hamilton presents for concern that the right ear has excess wax. It feels full and she is not hearing well.   History Sandra Hamilton has a past medical history of Arthritis; Hypertension; and Irritable bowel syndrome.   She has a past surgical history that includes Abdominal hysterectomy; Appendectomy; Thyroidectomy; Breast biopsy (Left); Cataract extraction w/PHACO (Right, 11/12/2015); Cataract extraction w/PHACO (Left, 12/28/2015); Abdominal surgery (Bilateral, 15 Mar 2016); Colonoscopy (2008); Eye surgery (Bilateral, march 2017); and laparotomy (N/A, 03/15/2016).   Her family history includes Dementia in her mother; Diabetes in her father; Heart disease in her mother and sister.She reports that she has never smoked. She has never used smokeless tobacco. She reports that she does not drink alcohol or use drugs.    ROS Review of Systems  Constitutional: Negative for appetite change, chills, diaphoresis, fatigue and fever.  HENT: Negative for congestion, ear pain, hearing loss, postnasal drip, rhinorrhea, sore throat and trouble swallowing.   Respiratory: Negative for cough, chest tightness and shortness of breath.   Cardiovascular: Negative for chest pain and palpitations.  Gastrointestinal: Negative for abdominal pain.  Musculoskeletal: Negative for arthralgias.  Skin: Negative for rash.    Objective:  BP 129/75   Pulse 85   Temp 97.4 F (36.3 C) (Oral)   Ht 5\' 2"  (1.575 m)   Wt 90 lb 12.8 oz (41.2 kg)   BMI 16.61 kg/m   BP Readings from Last 3 Encounters:  09/21/16 129/75  06/08/16 121/72  04/07/16 118/77    Wt Readings from Last 3 Encounters:  09/21/16 90 lb 12.8 oz (41.2 kg)  06/08/16 93 lb 3.2 oz (42.3 kg)  04/07/16 89 lb 3.2 oz (40.5 kg)     Physical Exam  Constitutional: She appears well-developed and well-nourished.  HENT:  Head: Normocephalic  and atraumatic.  Right Ear: Tympanic membrane and external ear normal. No middle ear effusion. Decreased hearing is noted.  Left Ear: Tympanic membrane and external ear normal. No decreased hearing is noted.  Nose: Mucosal edema present. Right sinus exhibits no frontal sinus tenderness. Left sinus exhibits no frontal sinus tenderness.  Mouth/Throat: No oropharyngeal exudate or posterior oropharyngeal erythema.  Pt. Had small amount of wax in right ear canal. This was lavaged & curetted with good removal.  Neck: No Brudzinski's sign noted.  Pulmonary/Chest: Breath sounds normal. No respiratory distress.  Lymphadenopathy:       Head (right side): No preauricular adenopathy present.       Head (left side): No preauricular adenopathy present.       Right cervical: No superficial cervical adenopathy present.      Left cervical: No superficial cervical adenopathy present.     Lab Results  Component Value Date   WBC 7.5 03/21/2016   HGB 9.7 (L) 03/21/2016   HCT 30.7 (L) 03/21/2016   PLT 253 03/21/2016   GLUCOSE 85 03/21/2016   CHOL 145 01/05/2015   TRIG 70 01/05/2015   HDL 70 01/05/2015   LDLCALC 126 (H) 05/05/2014   ALT 10 (L) 03/21/2016   AST 12 (L) 03/21/2016   NA 141 03/21/2016   K 3.3 (L) 03/21/2016   CL 107 03/21/2016   CREATININE 0.64 03/22/2016   BUN <5 (L) 03/21/2016   CO2 27 03/21/2016   TSH 3.520 03/07/2016   INR 1.13 03/15/2016    Dg Abd Portable 1v  Result Date: 03/16/2016 CLINICAL DATA:  Nasogastric tube position. EXAM: PORTABLE ABDOMEN - 1 VIEW COMPARISON:  None. FINDINGS: The bowel gas pattern is normal. Nasogastric tube tip is seen in expected position of distal stomach. Severe degenerative changes seen involving both hip joint. IMPRESSION: Nasogastric tube tip seen in expected position of distal stomach. Electronically Signed   By: Marijo Conception, M.D.   On: 03/16/2016 10:13    Assessment & Plan:   Sandra Hamilton was seen today for ear pain.  Diagnoses and all  orders for this visit:  Essential hypertension  Impacted cerumen of right ear    I am having [name] maintain her Fish Oil, calcium carbonate, Misc Natural Products (OSTEO BI-FLEX JOINT SHIELD PO), fluticasone, ENSURE HIGH PROTEIN, pantoprazole, amLODipine, and donepezil.  No orders of the defined types were placed in this encounter.    Follow-up: Return if symptoms worsen or fail to improve.  Claretta Fraise, M.D.

## 2016-09-21 NOTE — Patient Instructions (Signed)
Earwax removal: ° °Debrox drops are available without a prescription at your pharmacy. ° °Lay on your side with the ear up that you want to treat. Place for 5 drops of the Debrox in the ear canal and lay still for 15 minutes. After that time you considered up and allow the excess to run out of the year and catch it with a Kleenex. Repeat this with the other ear if needed. ° °Repeat this process daily for 1 week. By that time the ear should feel less clogged and her hearing should be better, if not, follow up in the office for recheck of the ear. ° °Thanks, °Odai Wimmer °

## 2016-09-22 ENCOUNTER — Telehealth: Payer: Self-pay | Admitting: Family Medicine

## 2016-11-04 ENCOUNTER — Other Ambulatory Visit: Payer: Self-pay | Admitting: Family Medicine

## 2016-12-27 ENCOUNTER — Other Ambulatory Visit: Payer: Self-pay | Admitting: Family Medicine

## 2017-02-14 ENCOUNTER — Encounter: Payer: Self-pay | Admitting: Family Medicine

## 2017-02-14 ENCOUNTER — Ambulatory Visit (INDEPENDENT_AMBULATORY_CARE_PROVIDER_SITE_OTHER): Payer: Medicare Other | Admitting: Family Medicine

## 2017-02-14 VITALS — BP 107/71 | HR 84 | Temp 97.0°F | Ht 62.0 in | Wt 88.0 lb

## 2017-02-14 DIAGNOSIS — L89152 Pressure ulcer of sacral region, stage 2: Secondary | ICD-10-CM

## 2017-02-14 MED ORDER — SERTRALINE HCL 25 MG PO TABS: 25.0000 mg | ORAL_TABLET | Freq: Every day | ORAL | 2 refills | Status: DC

## 2017-02-14 MED ORDER — DUODERM HYDROACTIVE EX MISC: 1.0000 | CUTANEOUS | 2 refills | Status: AC

## 2017-02-14 NOTE — Progress Notes (Signed)
Subjective:  Patient ID: Sandra Hamilton, female    DOB: [birth year]  Age: 81 y.o. MRN: [id number]  CC: Skin Ulcer (pt here today with her caregiver that stays with her from 8:30 till 5:30-6 pm 7 days a week c/o pressure sore on her sacrum for at least 2 months.)   HPI Sandra Hamilton presents for Symptoms of depression. Caretaker says she cries a lot. Patient says she is sad. She is not confused. She is less interested in activities. She is mostly upset that her kids never call her or come see her. Some haven't called her for over for 5 months. Meanwhile she also has some recurrence of her previous pressure ulcer of the sacrum. They have been using a cream that they got at [facility] for it. It seems to be helping but not resolving the problem. Patient says she has to sit at a certain angle to avoid putting pressure on the area and sitting on extra pillows to avoid pain. Pain is getting somewhat worse.  Depression screen [facility] 2/9 02/14/2017 09/21/2016 06/08/2016 04/07/2016 03/07/2016  Decreased Interest 3 0 0 0 0  Down, Depressed, Hopeless 3 0 0 0 0  PHQ - 2 Score 6 0 0 0 0  Altered sleeping 2 - - - -  Tired, decreased energy 1 - - - -  Change in appetite 0 - - - -  Feeling bad or failure about yourself  2 - - - -  Trouble concentrating 0 - - - -  Moving slowly or fidgety/restless 0 - - - -  Suicidal thoughts 0 - - - -  PHQ-9 Score 11 - - - -      History Sandra Hamilton has a past medical history of Arthritis; Hypertension; and Irritable bowel syndrome.   She has a past surgical history that includes Abdominal hysterectomy; Appendectomy; Thyroidectomy; Breast biopsy (Left); Cataract extraction w/PHACO (Right, 11/12/2015); Cataract extraction w/PHACO (Left, 12/28/2015); Abdominal surgery (Bilateral, 15 Mar 2016); Colonoscopy (2008); Eye surgery (Bilateral, march 2017); and laparotomy (N/A, 03/15/2016).   Her family history includes Dementia in her mother; Diabetes in her father; Heart disease in her  mother and sister.She reports that she has never smoked. She has never used smokeless tobacco. She reports that she does not drink alcohol or use drugs.    ROS Review of Systems  Constitutional: Negative for activity change, appetite change and fever.  HENT: Negative for congestion, rhinorrhea and sore throat.   Eyes: Negative for visual disturbance.  Respiratory: Negative for cough and shortness of breath.   Cardiovascular: Negative for chest pain and palpitations.  Gastrointestinal: Negative for abdominal pain, diarrhea and nausea.  Genitourinary: Negative for dysuria.  Musculoskeletal: Negative for arthralgias and myalgias.    Objective:  BP 107/71   Pulse 84   Temp 97 F (36.1 C) (Oral)   Ht 5\' 2"  (1.575 m)   Wt 88 lb (39.9 kg)   BMI 16.10 kg/m   BP Readings from Last 3 Encounters:  02/14/17 107/71  09/21/16 129/75  06/08/16 121/72    Wt Readings from Last 3 Encounters:  02/14/17 88 lb (39.9 kg)  09/21/16 90 lb 12.8 oz (41.2 kg)  06/08/16 93 lb 3.2 oz (42.3 kg)     Physical Exam  Constitutional: She is oriented to person, place, and time. She appears well-developed and well-nourished. No distress.  Cardiovascular: Normal rate and regular rhythm.   Pulmonary/Chest: Breath sounds normal.  Neurological: She is alert and oriented to person, place, and  time.  Skin: Skin is warm and dry.  There is a 5 cm area of erythema with some appearance noted at the mid sacrum. This is tender. There is no breakdown beyond the superficial layer of the skin. It is somewhat gaulded   Psychiatric: She has a normal mood and affect.      Assessment & Plan:   Sandra Hamilton was seen today for skin ulcer.  Diagnoses and all orders for this visit:  Sacral decubitus ulcer, stage II  Other orders -     sertraline (ZOLOFT) 25 MG tablet; Take 1 tablet (25 mg total) by mouth daily. -     Hydroactive Dressings (DUODERM HYDROACTIVE) MISC; Apply 1 each topically every 3 (three)  days.       I have discontinued Ms. Yost's Fish Oil, calcium carbonate, Misc Natural Products (OSTEO BI-FLEX JOINT SHIELD PO), fluticasone, ENSURE HIGH PROTEIN, and pantoprazole. I am also having her start on sertraline and DUODERM HYDROACTIVE. Additionally, I am having her maintain her amLODipine and donepezil.  Allergies as of 02/14/2017      Reactions   Bextra [valdecoxib] Shortness Of Breath   States she can take ibuprofen   Codeine Other (See Comments)   HEADACHE   Detrol La [tolterodine Tartrate Er]    Dizziness   Lipitor [atorvastatin] Other (See Comments)   REACTION IS UNKNOWN   Morphine And Related Other (See Comments)   "loopy"   Prednisone Other (See Comments)   REACTION IS UNKNOWN      Medication List       Accurate as of 02/14/17  4:24 PM. Always use your most recent med list.          amLODipine 2.5 MG tablet Commonly known as:  NORVASC Take 1 tablet (2.5 mg total) by mouth daily.   donepezil 5 MG tablet Commonly known as:  ARICEPT TAKE ONE TABLET AT BEDTIME   DUODERM HYDROACTIVE Misc Apply 1 each topically every 3 (three) days.   sertraline 25 MG tablet Commonly known as:  ZOLOFT Take 1 tablet (25 mg total) by mouth daily.        Follow-up: Return in about 2 weeks (around 02/28/2017).  Claretta Fraise, M.D.

## 2017-02-27 ENCOUNTER — Other Ambulatory Visit: Payer: Self-pay | Admitting: Family Medicine

## 2017-02-28 ENCOUNTER — Encounter: Payer: Self-pay | Admitting: Family Medicine

## 2017-02-28 ENCOUNTER — Ambulatory Visit (INDEPENDENT_AMBULATORY_CARE_PROVIDER_SITE_OTHER): Payer: Medicare Other | Admitting: Family Medicine

## 2017-02-28 VITALS — BP 118/67 | HR 72 | Temp 98.1°F | Ht 62.0 in | Wt 89.2 lb

## 2017-02-28 DIAGNOSIS — F330 Major depressive disorder, recurrent, mild: Secondary | ICD-10-CM

## 2017-02-28 DIAGNOSIS — L89152 Pressure ulcer of sacral region, stage 2: Secondary | ICD-10-CM

## 2017-02-28 MED ORDER — SERTRALINE HCL 50 MG PO TABS: 50.0000 mg | ORAL_TABLET | Freq: Every day | ORAL | 2 refills | Status: AC

## 2017-02-28 NOTE — Progress Notes (Signed)
Subjective:  Patient ID: Sandra Hamilton, female    DOB: 07-07-1936  Age: 81 y.o. MRN: [id number]  CC: Skin Ulcer (pt here today for a 2 week follow up of her decubitus ulcer on her sacral area. Pt and caregiver states it is looking better.)   HPI Sandra Hamilton presents for Recheck of the wound. No better or no worse. See picture below. Additionally they say that they're coming off after couple of days while she is sleeping in bed. Not lasting 3-4 days. However, they are replacing them every time they come off. There is no pain at the site.  Caretaker today states that the patient has not improved with regard to depression and she is exhibiting significant depression symptoms. Caretaker completed the pH Q along with the patient showing the numbers below. These were reviewed. Patient denied feeling down and depressed. Then she said, well maybe a little bit. Depression screen [facility] 2/9 02/28/2017 02/14/2017 09/21/2016 06/08/2016 04/07/2016  Decreased Interest 3 3 0 0 0  Down, Depressed, Hopeless 3 3 0 0 0  PHQ - 2 Score 6 6 0 0 0  Altered sleeping 2 2 - - -  Tired, decreased energy 1 1 - - -  Change in appetite 0 0 - - -  Feeling bad or failure about yourself  2 2 - - -  Trouble concentrating 0 0 - - -  Moving slowly or fidgety/restless 0 0 - - -  Suicidal thoughts 0 0 - - -  PHQ-9 Score 11 11 - - -    History Sandra Hamilton has a past medical history of Arthritis; Hypertension; and Irritable bowel syndrome.   She has a past surgical history that includes Abdominal hysterectomy; Appendectomy; Thyroidectomy; Breast biopsy (Left); Cataract extraction w/PHACO (Right, 11/12/2015); Cataract extraction w/PHACO (Left, 12/28/2015); Abdominal surgery (Bilateral, 15 Mar 2016); Colonoscopy (2008); Eye surgery (Bilateral, march 2017); and laparotomy (N/A, 03/15/2016).   Her family history includes Dementia in her mother; Diabetes in her father; Heart disease in her mother and sister.She reports that she has never smoked.  She has never used smokeless tobacco. She reports that she does not drink alcohol or use drugs.  Current Outpatient Prescriptions on File Prior to Visit  Medication Sig Dispense Refill  . amLODipine (NORVASC) 2.5 MG tablet Take 1 tablet (2.5 mg total) by mouth daily. 90 tablet 1  . donepezil (ARICEPT) 5 MG tablet TAKE ONE TABLET AT BEDTIME 30 tablet 5  . Hydroactive Dressings (DUODERM HYDROACTIVE) MISC Apply 1 each topically every 3 (three) days. 10 each 2   No current facility-administered medications on file prior to visit.     ROS Review of Systems Unchanged from 2 weeks ago Objective:  BP 118/67   Pulse 72   Temp 98.1 F (36.7 C) (Oral)   Ht 5\' 2"  (1.575 m)   Wt 89 lb 4 oz (40.5 kg)   BMI 16.32 kg/m   Physical Exam The wound is covered by Duoderm bandage. 90 inferior portion of the wound is protruding from the bottom of the bandage. The wound appears to be a grade to approximately 3 cm minimal hyperemia but no erythema     Assessment & Plan:   Sandra Hamilton was seen today for skin ulcer.  Diagnoses and all orders for this visit:  Sacral decubitus ulcer, stage II  Mild episode of recurrent major depressive disorder ([place])  Other orders -     sertraline (ZOLOFT) 50 MG tablet; Take 1 tablet (50 mg total) by  mouth daily.   I have changed Sandra Hamilton's sertraline. I am also having her maintain her amLODipine, DUODERM HYDROACTIVE, and donepezil.  Meds ordered this encounter  Medications  . sertraline (ZOLOFT) 50 MG tablet    Sig: Take 1 tablet (50 mg total) by mouth daily.    Dispense:  30 tablet    Refill:  2   Okay to reduce the bandage to one half to avoid pulling on the skin. One half should be sufficient to cover the wound.  Follow-up: Return in about 2 weeks (around 03/14/2017).  Claretta Fraise, M.D.

## 2017-03-15 ENCOUNTER — Ambulatory Visit: Payer: Medicare Other | Admitting: Family Medicine

## 2017-03-15 ENCOUNTER — Telehealth: Payer: Self-pay | Admitting: Family Medicine

## 2017-03-16 ENCOUNTER — Encounter (HOSPITAL_COMMUNITY): Payer: Self-pay | Admitting: *Deleted

## 2017-03-16 ENCOUNTER — Emergency Department (HOSPITAL_COMMUNITY)
Admission: EM | Admit: 2017-03-16 | Discharge: 2017-03-16 | Disposition: A | Discharge status: Home / Self Care | Payer: Medicare Other | Attending: Emergency Medicine | Admitting: Emergency Medicine

## 2017-03-16 ENCOUNTER — Emergency Department (HOSPITAL_COMMUNITY): Payer: Medicare Other

## 2017-03-16 DIAGNOSIS — Y9301 Activity, walking, marching and hiking: Secondary | ICD-10-CM | POA: Diagnosis not present

## 2017-03-16 DIAGNOSIS — W1839XA Other fall on same level, initial encounter: Secondary | ICD-10-CM | POA: Diagnosis not present

## 2017-03-16 DIAGNOSIS — M25551 Pain in right hip: Secondary | ICD-10-CM | POA: Diagnosis not present

## 2017-03-16 DIAGNOSIS — M79651 Pain in right thigh: Secondary | ICD-10-CM | POA: Diagnosis not present

## 2017-03-16 DIAGNOSIS — R52 Pain, unspecified: Secondary | ICD-10-CM

## 2017-03-16 DIAGNOSIS — S31000A Unspecified open wound of lower back and pelvis without penetration into retroperitoneum, initial encounter: Secondary | ICD-10-CM | POA: Diagnosis not present

## 2017-03-16 DIAGNOSIS — Y929 Unspecified place or not applicable: Secondary | ICD-10-CM | POA: Insufficient documentation

## 2017-03-16 DIAGNOSIS — M79606 Pain in leg, unspecified: Secondary | ICD-10-CM | POA: Diagnosis not present

## 2017-03-16 DIAGNOSIS — M1612 Unilateral primary osteoarthritis, left hip: Secondary | ICD-10-CM | POA: Insufficient documentation

## 2017-03-16 DIAGNOSIS — F0391 Unspecified dementia with behavioral disturbance: Secondary | ICD-10-CM | POA: Insufficient documentation

## 2017-03-16 DIAGNOSIS — I10 Essential (primary) hypertension: Secondary | ICD-10-CM | POA: Diagnosis not present

## 2017-03-16 DIAGNOSIS — M160 Bilateral primary osteoarthritis of hip: Secondary | ICD-10-CM | POA: Diagnosis not present

## 2017-03-16 DIAGNOSIS — S3992XA Unspecified injury of lower back, initial encounter: Secondary | ICD-10-CM | POA: Diagnosis not present

## 2017-03-16 DIAGNOSIS — Y999 Unspecified external cause status: Secondary | ICD-10-CM | POA: Insufficient documentation

## 2017-03-16 DIAGNOSIS — S79911A Unspecified injury of right hip, initial encounter: Secondary | ICD-10-CM | POA: Diagnosis not present

## 2017-03-16 DIAGNOSIS — M1611 Unilateral primary osteoarthritis, right hip: Secondary | ICD-10-CM | POA: Diagnosis not present

## 2017-03-16 DIAGNOSIS — F0390 Unspecified dementia without behavioral disturbance: Secondary | ICD-10-CM | POA: Diagnosis not present

## 2017-03-16 LAB — BASIC METABOLIC PANEL
ANION GAP: 7 (ref 5–15)
BUN: 25 mg/dL — ABNORMAL HIGH (ref 6–20)
CALCIUM: 8.1 mg/dL — AB (ref 8.9–10.3)
CO2: 25 mmol/L (ref 22–32)
Chloride: 103 mmol/L (ref 101–111)
Creatinine, Ser: 0.7 mg/dL (ref 0.44–1.00)
GLUCOSE: 91 mg/dL (ref 65–99)
Potassium: 3.1 mmol/L — ABNORMAL LOW (ref 3.5–5.1)
Sodium: 135 mmol/L (ref 135–145)

## 2017-03-16 LAB — CBC WITH DIFFERENTIAL/PLATELET
Basophils Absolute: 0 10*3/uL (ref 0.0–0.1)
Basophils Relative: 0 %
EOS ABS: 0.1 10*3/uL (ref 0.0–0.7)
EOS PCT: 1 %
HCT: 36 % (ref 36.0–46.0)
HEMOGLOBIN: 11.7 g/dL — AB (ref 12.0–15.0)
Lymphocytes Relative: 19 %
Lymphs Abs: 1.3 10*3/uL (ref 0.7–4.0)
MCH: 28 pg (ref 26.0–34.0)
MCHC: 32.5 g/dL (ref 30.0–36.0)
MCV: 86.1 fL (ref 78.0–100.0)
Monocytes Absolute: 0.6 10*3/uL (ref 0.1–1.0)
Monocytes Relative: 8 %
NEUTROS ABS: 5 10*3/uL (ref 1.7–7.7)
NEUTROS PCT: 72 %
PLATELETS: 231 10*3/uL (ref 150–400)
RBC: 4.18 MIL/uL (ref 3.87–5.11)
RDW: 14 % (ref 11.5–15.5)
WBC: 7 10*3/uL (ref 4.0–10.5)

## 2017-03-16 MED ORDER — SERTRALINE HCL 50 MG PO TABS: 50.0000 mg | ORAL_TABLET | Freq: Every day | ORAL | Status: DC

## 2017-03-16 MED ORDER — ACETAMINOPHEN 325 MG PO TABS: 650.0000 mg | ORAL_TABLET | Freq: Four times a day (QID) | ORAL | Status: DC | PRN

## 2017-03-16 MED ORDER — SODIUM CHLORIDE 0.9 % IV BOLUS (SEPSIS)
1000.0000 mL | Freq: Once | INTRAVENOUS | Status: AC
  Administered 2017-03-16: 1000 mL via INTRAVENOUS

## 2017-03-16 MED ORDER — ACETAMINOPHEN 325 MG PO TABS
650.0000 mg | ORAL_TABLET | Freq: Once | ORAL | Status: AC
  Administered 2017-03-16: 650 mg via ORAL
  Filled 2017-03-16: qty 2

## 2017-03-16 MED ORDER — DONEPEZIL HCL 5 MG PO TABS
5.0000 mg | ORAL_TABLET | Freq: Every day | ORAL | Status: DC
  Filled 2017-03-16: qty 1

## 2017-03-16 NOTE — ED Notes (Signed)
Meal given to pt.

## 2017-03-16 NOTE — ED Notes (Signed)
On phone with CSW, Jarrett Soho at this time. Plan is to discharge pt home with daughter and they will figure out payment plan over the next few days to be placed.

## 2017-03-16 NOTE — Progress Notes (Signed)
LCSW following for disposition of needs. Spoke with patient daughter via phone and at this time family will take patient home as she does not have a qualifying stay and unsure if they will qualify for medicaid or pay privately.   Daughter has sitters in place but cannot afford private duty 24/7. Daughter will follow up with [facility] with regards to placement and options. LCSW answered all questions for daughter and assisted with resources as well.  No other needs voiced.  RN and MD made aware of plan.    No other needs at this time.  Lane Hacker, MSW Clinical Social Work: [employer] Coverage for :  [phone]

## 2017-03-16 NOTE — ED Notes (Signed)
Pt daughter given materials to bathe, states she would like to help her mother herself and did not need assistance at this time.

## 2017-03-16 NOTE — Telephone Encounter (Signed)
Pt went to [facility] this am, they are going to help with placement per notes

## 2017-03-16 NOTE — ED Notes (Signed)
Notified Dr. Regenia Skeeter of pt bp 89/64 and repeat bp that were similar.

## 2017-03-16 NOTE — Clinical Social Work Note (Signed)
Patient presents in emergency department with her daughter, Sandra Hamilton. At baseline patient ambulates with a rolling walker. Patient continues to be able to feed herself, take sponge baths and dress independently.  Patient has a sitter that comes seven days per week from 8-6.  Patient's mobility began to decline this week to a point where patient could not walk.  Patient's family as been working with PCP to complete FL2 in an effort to seek placement.  Patient's daughter is aware that patient does not have a three night inpatient stay and private pay is being considered.  Patient's daughter requested that patient's information be faxed to [facility] for consideration.     LCSW obtained PASRR number and sent clinicals to [facility] via [employer].      Donnel Venuto, Clydene Pugh, LCSW

## 2017-03-16 NOTE — ED Notes (Signed)
Pt placed on bedpan for urination, bedpan removed and skin cleaned and patted dry. Repositioned pt on right side.

## 2017-03-16 NOTE — ED Notes (Addendum)
Attempted to stand and ambulate pt. Pt very frightful. When we finally got pt up to stand, her legs stayed locked and the pt kept repeating, "I'm going to fall, I'm going to fall." "Please let me sit back in the bed."  Pt gripping legs as if she was in pain. Pt stated she was very dizzy.   Pt stated, "I want to apologize for acting like a fool, I knew y'all had me but I'm terrified that I am going to fall."

## 2017-03-16 NOTE — ED Notes (Addendum)
Meal tray given to pt.  Daughter at bedside. Pt sitting up in bed at this time, able to reposition self.

## 2017-03-16 NOTE — NC FL2 (Signed)
  [place] MEDICAID FL2 LEVEL OF CARE SCREENING TOOL     IDENTIFICATION  Patient Name: Sandra Hamilton Birthdate: 08-05-1936 Sex: female Admission Date (Current Location): 03/16/2017  [facility] and [place] Number:  [employer] and Address:  [place] [address] [place]      Provider Number: [id number]  Attending Physician Name and Address:  Default, Provider, MD  Relative Name and Phone Number:       Current Level of Care: Other (Comment) (Patient is currently in emergency department. ) Recommended Level of Care: [place] Prior Approval Number:    Date Approved/Denied:   PASRR Number: [id number] ([id number])  Discharge Plan: SNF    Current Diagnoses: Patient Active Problem List   Diagnosis Date Noted  . Dementia 03/07/2016  . Essential hypertension 03/07/2016  . Hyperlipidemia with target LDL less than 100 01/05/2015    Orientation RESPIRATION BLADDER Height & Weight     Self  Normal Incontinent Weight: 89 lb (40.4 kg) Height:  5\' 5"  (165.1 cm)  BEHAVIORAL SYMPTOMS/MOOD NEUROLOGICAL BOWEL NUTRITION STATUS      Incontinent Diet (Regular)  AMBULATORY STATUS COMMUNICATION OF NEEDS Skin   Limited Assist Verbally                         Personal Care Assistance Level of Assistance  Bathing, Feeding, Dressing Bathing Assistance: Limited assistance Feeding assistance: Independent Dressing Assistance: Limited assistance     Functional Limitations Info  Hearing, Speech, Sight Sight Info: Adequate Hearing Info: Adequate Speech Info: Adequate    SPECIAL CARE FACTORS FREQUENCY                       Contractures Contractures Info: Not present    Additional Factors Info  Allergies, Psychotropic   Allergies Info: Bextra, Codeine, Detrol La, Lipitor, Morphine and related, Prednisone Psychotropic Info: Zoloft         Current Medications (03/16/2017):  This is the current hospital active  medication list Current Facility-Administered Medications  Medication Dose Route Frequency Provider Last Rate Last Dose  . acetaminophen (TYLENOL) tablet 650 mg  650 mg Oral Q6H PRN Ripley Fraise, MD      . donepezil (ARICEPT) tablet 5 mg  5 mg Oral QHS Ripley Fraise, MD      . sertraline (ZOLOFT) tablet 50 mg  50 mg Oral Daily Ripley Fraise, MD       Current Outpatient Prescriptions  Medication Sig Dispense Refill  . amLODipine (NORVASC) 2.5 MG tablet Take 1 tablet (2.5 mg total) by mouth daily. 90 tablet 1  . donepezil (ARICEPT) 5 MG tablet TAKE ONE TABLET AT BEDTIME 30 tablet 5  . Hydroactive Dressings (DUODERM HYDROACTIVE) MISC Apply 1 each topically every 3 (three) days. 10 each 2  . sertraline (ZOLOFT) 50 MG tablet Take 1 tablet (50 mg total) by mouth daily. 30 tablet 2     Discharge Medications: Please see discharge summary for a list of discharge medications.  Relevant Imaging Results:  Relevant Lab Results:   Additional Information SSN [id number] Clydene Pugh, LCSW

## 2017-03-16 NOTE — ED Notes (Signed)
Pt made aware to return if symptoms worsen or if any life threatening symptoms occur.   

## 2017-03-16 NOTE — NC FL2 (Deleted)
  [place] MEDICAID FL2 LEVEL OF CARE SCREENING TOOL     IDENTIFICATION  Patient Name: Sandra Hamilton Birthdate: 1936/02/23 Sex: female Admission Date (Current Location): 03/16/2017  [facility] and [place] Number:  [employer] and Address:  [place] [address] [place]      Provider Number: [id number]  Attending Physician Name and Address:  Default, Provider, MD  Relative Name and Phone Number:       Current Level of Care: Other (Comment) (Patient is currently in emergency department. ) Recommended Level of Care: [place] Prior Approval Number:    Date Approved/Denied:   PASRR Number: [id number] ([id number])  Discharge Plan: SNF    Current Diagnoses: Patient Active Problem List   Diagnosis Date Noted  . Dementia 03/07/2016  . Essential hypertension 03/07/2016  . Hyperlipidemia with target LDL less than 100 01/05/2015    Orientation RESPIRATION BLADDER Height & Weight     Self  Normal Incontinent Weight: 89 lb (40.4 kg) Height:  5\' 5"  (165.1 cm)  BEHAVIORAL SYMPTOMS/MOOD NEUROLOGICAL BOWEL NUTRITION STATUS      Incontinent Diet (Regular)  AMBULATORY STATUS COMMUNICATION OF NEEDS Skin   Limited Assist Verbally                         Personal Care Assistance Level of Assistance  Bathing, Feeding, Dressing Bathing Assistance: Limited assistance Feeding assistance: Independent Dressing Assistance: Limited assistance     Functional Limitations Info  Hearing, Speech, Sight Sight Info: Adequate Hearing Info: Adequate Speech Info: Adequate    SPECIAL CARE FACTORS FREQUENCY                       Contractures Contractures Info: Not present    Additional Factors Info  Allergies, Psychotropic   Allergies Info: Bextra, Codeine, Detrol La, Lipitor, Morphine and related, Prednisone Psychotropic Info: Zoloft         Current Medications (03/16/2017):  This is the current hospital active  medication list Current Facility-Administered Medications  Medication Dose Route Frequency Provider Last Rate Last Dose  . acetaminophen (TYLENOL) tablet 650 mg  650 mg Oral Q6H PRN Ripley Fraise, MD      . donepezil (ARICEPT) tablet 5 mg  5 mg Oral QHS Ripley Fraise, MD      . sertraline (ZOLOFT) tablet 50 mg  50 mg Oral Daily Ripley Fraise, MD       Current Outpatient Prescriptions  Medication Sig Dispense Refill  . amLODipine (NORVASC) 2.5 MG tablet Take 1 tablet (2.5 mg total) by mouth daily. 90 tablet 1  . donepezil (ARICEPT) 5 MG tablet TAKE ONE TABLET AT BEDTIME 30 tablet 5  . Hydroactive Dressings (DUODERM HYDROACTIVE) MISC Apply 1 each topically every 3 (three) days. 10 each 2  . sertraline (ZOLOFT) 50 MG tablet Take 1 tablet (50 mg total) by mouth daily. 30 tablet 2     Discharge Medications: Please see discharge summary for a list of discharge medications.  Relevant Imaging Results:  Relevant Lab Results:   Additional Information SSN [id number] Clydene Pugh, LCSW

## 2017-03-16 NOTE — ED Provider Notes (Signed)
[facility] DEPT Provider Note   CSN: [id number] Arrival date & time: 03/16/17  0226     History   Chief Complaint Chief Complaint  Patient presents with  . Leg Pain   LEVEL 5 CAVEAT DUE TO DEMENTIA  HPI Sandra Hamilton is a 81 y.o. female.  The history is provided by the patient and a relative.  Leg Pain   This is a new problem. The current episode started more than 2 days ago. The problem occurs daily. The problem has been gradually worsening. The pain is present in the right hip and right upper leg. The pain is moderate. Associated symptoms include limited range of motion. The symptoms are aggravated by standing. She has tried OTC pain medications for the symptoms.   Patient with h/o dementia, HTN and arthritis presenting with right leg pain Daughter at bedside gives most of her history Apparently has had recent right LE pain, but over past several days is having difficulty ambulating/bearing weight She has had some recent falls but no acute injury has been reported Tonight her pain worsened, and daughter reports pt was anxious/hyperventilating due to pain. She had also mentioned she had HA as well as back pain  Pt now reports only having right LE pain She has no other complaints currently  Pt also has known sacral ulcer, daughter is requesting evaluation of this lesions   No fever/vomiting is reported  Past Medical History:  Diagnosis Date  . Arthritis   . Hypertension   . Irritable bowel syndrome     Patient Active Problem List   Diagnosis Date Noted  . Dementia 03/07/2016  . Essential hypertension 03/07/2016  . Hyperlipidemia with target LDL less than 100 01/05/2015    Past Surgical History:  Procedure Laterality Date  . ABDOMINAL HYSTERECTOMY    . ABDOMINAL SURGERY Bilateral 15 Mar 2016  . APPENDECTOMY    . BREAST BIOPSY Left   . CATARACT EXTRACTION W/PHACO Right 11/12/2015   Procedure: CATARACT EXTRACTION PHACO AND INTRAOCULAR LENS PLACEMENT RIGHT  EYE;  Surgeon: Tonny Branch, MD;  Location: [facility];  Service: Ophthalmology;  Laterality: Right;  CDE 7.40  . CATARACT EXTRACTION W/PHACO Left 12/28/2015   Procedure: CATARACT EXTRACTION PHACO AND INTRAOCULAR LENS PLACEMENT LEFT EYE CDE=4.79;  Surgeon: Tonny Branch, MD;  Location: [facility];  Service: Ophthalmology;  Laterality: Left;  . COLONOSCOPY  2008  . EYE SURGERY Bilateral march 2017   cataracts removed  . LAPAROTOMY N/A 03/15/2016   Procedure: EXPLORATORY LAPAROTOMY, CLOSURE OF PYLORIC ULCER ;  Surgeon: Johnathan Hausen, MD;  Location: [facility];  Service: General;  Laterality: N/A;  . THYROIDECTOMY      OB History    No data available       Home Medications    Prior to Admission medications   Medication Sig Start Date End Date Taking? Authorizing Provider  amLODipine (NORVASC) 2.5 MG tablet Take 1 tablet (2.5 mg total) by mouth daily. 06/27/16   Claretta Fraise, MD  donepezil (ARICEPT) 5 MG tablet TAKE ONE TABLET AT BEDTIME 02/27/17   Claretta Fraise, MD  Hydroactive Dressings (DUODERM HYDROACTIVE) MISC Apply 1 each topically every 3 (three) days. 02/14/17   Claretta Fraise, MD  sertraline (ZOLOFT) 50 MG tablet Take 1 tablet (50 mg total) by mouth daily. 02/28/17   Claretta Fraise, MD    Family History Family History  Problem Relation Age of Onset  . Heart disease Mother   . Dementia Mother   . Diabetes Father   .  Heart disease Sister     Social History Social History  Substance Use Topics  . Smoking status: Never Smoker  . Smokeless tobacco: Never Used  . Alcohol use No     Allergies   Bextra [valdecoxib]; Codeine; Detrol la [tolterodine tartrate er]; Lipitor [atorvastatin]; Morphine and related; and Prednisone   Review of Systems Review of Systems  Unable to perform ROS: Dementia     Physical Exam Updated Vital Signs BP 109/67 (BP Location: Right Arm)   Pulse 72   Temp 98.1 F (36.7 C) (Oral)   Resp 20   Ht 1.651 m (5\' 5" )   Wt 40.4 kg (89 lb)   SpO2 100%   BMI  14.81 kg/m   Physical Exam  CONSTITUTIONAL: Elderly, frail but no acute distress HEAD: Normocephalic/atraumatic, no signs of trauma EYES: EOMI/PERRL ENMT: Mucous membranes moist NECK: supple no meningeal signs SPINE/BACK:entire spine nontender, No bruising/crepitance/stepoffs noted to spine CV: S1/S2 noted, no murmurs/rubs/gallops noted LUNGS: Lungs are clear to auscultation bilaterally, no apparent distress ABDOMEN: soft, nontender GU:no cva tenderness NEURO: Pt is awake/alert.  She is pleasantly confused but does answer most questions appropriately.  No focal weakness noted EXTEMITIES: pulses normal/equal, full ROM.  Tenderness to palpation and range of motion of right hip/femur.  Pelvis stable.  All other extremities/joints palpated/ranged and nontender No deformities SKIN: warm, color normal, well healing sacral wound noted, nurse melinda present for exam   ED Treatments / Results  Labs (all labs ordered are listed, but only abnormal results are displayed) Labs Reviewed  BASIC METABOLIC PANEL - Abnormal; Notable for the following:       Result Value   Potassium 3.1 (*)    BUN 25 (*)    Calcium 8.1 (*)    All other components within normal limits  CBC WITH DIFFERENTIAL/PLATELET - Abnormal; Notable for the following:    Hemoglobin 11.7 (*)    All other components within normal limits    EKG  EKG Interpretation None       Radiology Dg Lumbar Spine Complete  Result Date: 03/16/2017 CLINICAL DATA:  [place] yesterday, chronic RIGHT leg pain. EXAM: LUMBAR SPINE - COMPLETE 4+ VIEW COMPARISON:  CT abdomen and pelvis June 11, 2013 FINDINGS: Lumbar vertebral bodies intact. Broad levoscoliosis Grade 1 L4-5 and minimal grade 1 L3-4 anterolisthesis. Severe L2-3 and L5-S1 disc height loss with endplate sclerosis and marginal spurring compatible with degenerative discs, moderate L3-4, L4-5. Multilevel severe facet arthropathy. Osteopenia without destructive bony lesions. Mild  calcific atherosclerosis. IMPRESSION: No acute fracture deformity. Stable grade 1 L3-4 and L4-5 anterolisthesis. Electronically Signed   By: Elon Alas M.D.   On: 03/16/2017 04:22   Dg Hip Unilat With Pelvis 2-3 Views Right  Result Date: 03/16/2017 CLINICAL DATA:  [place] yesterday, gluteal pain. EXAM: DG HIP (WITH OR WITHOUT PELVIS) 2-3V RIGHT; RIGHT FEMUR 2 VIEWS COMPARISON:  None. FINDINGS: No acute fracture deformity. Old appearing LEFT pubic symphysis and LEFT inferior pubic ramus fractures. Severe bilateral hip osteoarthrosis with flattened femoral heads with shallow acetabula. Osteopenia without destructive bony lesions. Soft tissue planes are nonsuspicious. IMPRESSION: No acute fracture deformity or dislocation. Severe bilateral hip osteoarthrosis and secondary femoral head avascular necrosis. Electronically Signed   By: Elon Alas M.D.   On: 03/16/2017 04:17   Dg Femur Min 2 Views Right  Result Date: 03/16/2017 CLINICAL DATA:  [place] yesterday, gluteal pain. EXAM: DG HIP (WITH OR WITHOUT PELVIS) 2-3V RIGHT; RIGHT FEMUR 2 VIEWS COMPARISON:  None. FINDINGS: No acute  fracture deformity. Old appearing LEFT pubic symphysis and LEFT inferior pubic ramus fractures. Severe bilateral hip osteoarthrosis with flattened femoral heads with shallow acetabula. Osteopenia without destructive bony lesions. Soft tissue planes are nonsuspicious. IMPRESSION: No acute fracture deformity or dislocation. Severe bilateral hip osteoarthrosis and secondary femoral head avascular necrosis. Electronically Signed   By: Elon Alas M.D.   On: 03/16/2017 04:17    Procedures Procedures (including critical care time)  Medications Ordered in ED Medications  donepezil (ARICEPT) tablet 5 mg (not administered)  sertraline (ZOLOFT) tablet 50 mg (not administered)  acetaminophen (TYLENOL) tablet 650 mg (not administered)  acetaminophen (TYLENOL) tablet 650 mg (650 mg Oral Given 03/16/17 0354)     Initial  Impression / Assessment and Plan / ED Course  I have reviewed the triage vital signs and the nursing notes.  Pertinent labs & imaging results that were available during my care of the patient were reviewed by me and considered in my medical decision making (see chart for details).     3:44 AM Pt stable History difficult as pt with dementia and daughter not full time caregiver (she lives in [place]) Her main issue is right LE pain but also reported back pain Imaging ordered No signs of head trauma will defer CT head for now Sacral wound looks similar to photos in chart from PCP 5:22 AM Imaging negative for acute fracture.  She has severe hip arthritis We attempted to ambulate but she became very anxious, crying and yelling and refused to stand as she feared falling.  She stayed in the bed.   I suspect this more of a behavioral issue rather than occult fracture.  Daughter reports she was bearing some weight at home yesterday  Daughter reports she is in town to try and get patient into [facility] because patient is becoming difficult to manage due to frailty and dementia.  She spends most days in bed.  Pt became very upset and reports she can go home and can take care of household duties, but daughter reports she this is not true.  Pt is now unsure of how she even got to the ER.  This all appears related to her dementia.  Plan: consult social work/case management in the ED later this morning for assistance with placement   Final Clinical Impressions(s) / ED Diagnoses   Final diagnoses:  Pain  Primary osteoarthritis of both hips  Dementia with behavioral disturbance, unspecified dementia type  Wound of sacral region, initial encounter    New Prescriptions New Prescriptions   No medications on file     Ripley Fraise, MD 03/16/17 [phone]

## 2017-03-16 NOTE — ED Triage Notes (Addendum)
Pt arrived to er for further evaluation of leg pain and not being able to walk for the past few days, pt states that she has had pain in her right leg for "awhile" and it has gotten worse, denies any injury, daughter reports that pt fell this afternoon while trying to walk today, daughter is also concerned with a wound on pt's buttock region,  Pt is vague with her symptoms, when RN asked pt what caused her to come to er tonight, pt states ' I am not sure", has hx of dementia,

## 2017-03-16 NOTE — ED Provider Notes (Signed)
Patient care transferred to me. Social worker has tried to help arrange placement for patient but due to her Medicare and no admission this is difficult. However she does not need a admission criteria. Had transient low blood pressure but this has come up. She has no current complaints. Discussed with daughter and Education officer, museum, they plan to discharge home with outpatient placement pending. Daughter and her family will take the patient the meantime. They have walker and help at home. Strict return precautions, follow-up close it with PCP.   Sherwood Gambler, MD 03/16/17 [phone]

## 2017-03-16 NOTE — Care Management (Signed)
CM consult received for help with placement. CM signing off and CSW aware of pt needs. May be re-consulted if needed.

## 2017-03-16 NOTE — ED Notes (Signed)
Pt positioned self on left side.

## 2017-03-17 ENCOUNTER — Ambulatory Visit: Payer: Medicare Other | Admitting: Family Medicine

## 2017-03-22 ENCOUNTER — Ambulatory Visit (INDEPENDENT_AMBULATORY_CARE_PROVIDER_SITE_OTHER): Payer: Medicare Other | Admitting: Family Medicine

## 2017-03-22 VITALS — BP 102/65 | HR 78 | Ht 65.0 in

## 2017-03-22 DIAGNOSIS — L89152 Pressure ulcer of sacral region, stage 2: Secondary | ICD-10-CM | POA: Diagnosis not present

## 2017-03-22 MED ORDER — CELECOXIB 200 MG PO CAPS: 200.0000 mg | ORAL_CAPSULE | Freq: Every day | ORAL | 5 refills | Status: AC

## 2017-03-22 NOTE — Progress Notes (Signed)
Subjective:  Patient ID: Sandra Hamilton, female    DOB: October 08, 1936  Age: 81 y.o. MRN: [id number]  CC: Skin Ulcer (pt here today for a follow up of her decubitus ulcer of the sacrum)   HPI Sandra Hamilton presents for Lower back pain increasing steadily over the last week.. The patient has recently been seen at [facility] emergency room and told that her decubitus was doing so well she didn't need to come back. However the pain has become intolerable. She is in today to assess the status of the wound. She needs some relief from her pain. She is unable to describe on a 1-10 scale. It just hurts a lot. The pain is rather constant. Her attendant is with her. They've been using duo dermal until recently. They've discontinued that and are now just doing gauze dressings. The attendant tells me that the patient has a recliner at home but she insists on sitting up in a straight chair instead. Patient says it hurts to sit up. Her dementia may be preventing her from understanding the need to change position. History Sandra Hamilton has a past medical history of Arthritis; Hypertension; and Irritable bowel syndrome.   She has a past surgical history that includes Abdominal hysterectomy; Appendectomy; Thyroidectomy; Breast biopsy (Left); Cataract extraction w/PHACO (Right, 11/12/2015); Cataract extraction w/PHACO (Left, 12/28/2015); Abdominal surgery (Bilateral, 15 Mar 2016); Colonoscopy (2008); Eye surgery (Bilateral, march 2017); and laparotomy (N/A, 03/15/2016).   Her family history includes Dementia in her mother; Diabetes in her father; Heart disease in her mother and sister.She reports that she has never smoked. She has never used smokeless tobacco. She reports that she does not drink alcohol or use drugs.    ROS Review of Systems  Constitutional: Negative for activity change, appetite change and fever.  HENT: Negative for congestion, rhinorrhea and sore throat.   Eyes: Negative for visual disturbance.    Respiratory: Negative for cough and shortness of breath.   Cardiovascular: Negative for chest pain and palpitations.  Gastrointestinal: Negative for abdominal pain, diarrhea and nausea.  Genitourinary: Negative for dysuria.  Musculoskeletal: Negative for arthralgias and myalgias.    Objective:  BP 102/65   Pulse 78   Ht 5\' 5"  (1.651 m)   BP Readings from Last 3 Encounters:  03/22/17 102/65  03/16/17 108/70  02/28/17 118/67    Wt Readings from Last 3 Encounters:  03/16/17 89 lb (40.4 kg)  02/28/17 89 lb 4 oz (40.5 kg)  02/14/17 88 lb (39.9 kg)     Physical Exam  Constitutional: She is oriented to person, place, and time. She appears well-developed and well-nourished. No distress.  HENT:  Head: Normocephalic and atraumatic.  Eyes: Conjunctivae are normal. Pupils are equal, round, and reactive to light.  Neck: Normal range of motion. Neck supple. No thyromegaly present.  Cardiovascular: Normal rate, regular rhythm and normal heart sounds.   No murmur heard. Pulmonary/Chest: Effort normal and breath sounds normal. No respiratory distress. She has no wheezes. She has no rales.  Abdominal: Soft. Bowel sounds are normal. She exhibits no distension. There is no tenderness.  Musculoskeletal: Normal range of motion.  Lymphadenopathy:    She has no cervical adenopathy.  Neurological: She is alert and oriented to person, place, and time.  Skin: Skin is warm and dry.  The sacrum has mild erythema into the midline buttocks crease. At the superior margin of the crease and just off to the left is a 1 cm grade 1-2 decubitus. This was dressed  with Neosporin and heavily padded with gauze  Psychiatric: She has a normal mood and affect. Her behavior is normal. Judgment and thought content normal.      Assessment & Plan:   Sandra Hamilton was seen today for skin ulcer.  Diagnoses and all orders for this visit:  Sacral decubitus ulcer, stage II  Other orders -     celecoxib (CELEBREX) 200 MG  capsule; Take 1 capsule (200 mg total) by mouth daily. With food   Patient and attendant increased to frequently change position. Wound care instructions given including washing with soap and water daily. Changing the dressing twice daily and applying a fresh coating of Neosporin. He has several layers of gauze for heavy padding.    I am having [name] start on celecoxib. I am also having her maintain her DUODERM HYDROACTIVE, donepezil, sertraline, and acetaminophen.  Allergies as of 03/22/2017      Reactions   Bextra [valdecoxib] Shortness Of Breath   States she can take ibuprofen   Codeine Other (See Comments)   HEADACHE   Detrol La [tolterodine Tartrate Er]    Dizziness   Lipitor [atorvastatin] Other (See Comments)   REACTION IS UNKNOWN   Morphine And Related Other (See Comments)   "loopy"   Prednisone Other (See Comments)   REACTION IS UNKNOWN      Medication List       Accurate as of 03/22/17  2:27 PM. Always use your most recent med list.          acetaminophen 325 MG tablet Commonly known as:  TYLENOL Take 325 mg by mouth every 6 (six) hours as needed for moderate pain.   celecoxib 200 MG capsule Commonly known as:  CELEBREX Take 1 capsule (200 mg total) by mouth daily. With food   donepezil 5 MG tablet Commonly known as:  ARICEPT TAKE ONE TABLET AT BEDTIME   DUODERM HYDROACTIVE Misc Apply 1 each topically every 3 (three) days.   sertraline 50 MG tablet Commonly known as:  ZOLOFT Take 1 tablet (50 mg total) by mouth daily.      Patient encouraged to use recliner to change her position at least once an hour. This should decrease the pressure on the sacral wound.  Follow-up: Return in about 2 weeks (around 04/05/2017).  Claretta Fraise, M.D.

## 2017-04-03 ENCOUNTER — Telehealth: Payer: Self-pay | Admitting: Family Medicine

## 2017-04-04 DIAGNOSIS — I10 Essential (primary) hypertension: Secondary | ICD-10-CM | POA: Diagnosis not present

## 2017-04-04 DIAGNOSIS — R279 Unspecified lack of coordination: Secondary | ICD-10-CM | POA: Diagnosis not present

## 2017-04-04 DIAGNOSIS — R41841 Cognitive communication deficit: Secondary | ICD-10-CM | POA: Diagnosis not present

## 2017-04-04 DIAGNOSIS — F0390 Unspecified dementia without behavioral disturbance: Secondary | ICD-10-CM | POA: Diagnosis not present

## 2017-04-04 DIAGNOSIS — E785 Hyperlipidemia, unspecified: Secondary | ICD-10-CM | POA: Diagnosis not present

## 2017-04-04 DIAGNOSIS — M6281 Muscle weakness (generalized): Secondary | ICD-10-CM | POA: Diagnosis not present

## 2017-04-05 ENCOUNTER — Ambulatory Visit: Payer: Medicare Other | Admitting: Family Medicine

## 2017-04-05 DIAGNOSIS — F0390 Unspecified dementia without behavioral disturbance: Secondary | ICD-10-CM | POA: Diagnosis not present

## 2017-04-05 DIAGNOSIS — R41841 Cognitive communication deficit: Secondary | ICD-10-CM | POA: Diagnosis not present

## 2017-04-05 DIAGNOSIS — R279 Unspecified lack of coordination: Secondary | ICD-10-CM | POA: Diagnosis not present

## 2017-04-05 NOTE — Telephone Encounter (Signed)
Pt has been admitted to [facility] as of 04/03/17

## 2017-04-06 DIAGNOSIS — D518 Other vitamin B12 deficiency anemias: Secondary | ICD-10-CM | POA: Diagnosis not present

## 2017-04-06 DIAGNOSIS — E039 Hypothyroidism, unspecified: Secondary | ICD-10-CM | POA: Diagnosis not present

## 2017-04-06 DIAGNOSIS — F0390 Unspecified dementia without behavioral disturbance: Secondary | ICD-10-CM | POA: Diagnosis not present

## 2017-04-06 DIAGNOSIS — Z79899 Other long term (current) drug therapy: Secondary | ICD-10-CM | POA: Diagnosis not present

## 2017-04-06 DIAGNOSIS — R279 Unspecified lack of coordination: Secondary | ICD-10-CM | POA: Diagnosis not present

## 2017-04-06 DIAGNOSIS — E782 Mixed hyperlipidemia: Secondary | ICD-10-CM | POA: Diagnosis not present

## 2017-04-06 DIAGNOSIS — E119 Type 2 diabetes mellitus without complications: Secondary | ICD-10-CM | POA: Diagnosis not present

## 2017-04-06 DIAGNOSIS — D649 Anemia, unspecified: Secondary | ICD-10-CM | POA: Diagnosis not present

## 2017-04-06 DIAGNOSIS — R41841 Cognitive communication deficit: Secondary | ICD-10-CM | POA: Diagnosis not present

## 2017-04-06 DIAGNOSIS — E440 Moderate protein-calorie malnutrition: Secondary | ICD-10-CM | POA: Diagnosis not present

## 2017-04-06 DIAGNOSIS — E559 Vitamin D deficiency, unspecified: Secondary | ICD-10-CM | POA: Diagnosis not present

## 2017-04-06 DIAGNOSIS — E785 Hyperlipidemia, unspecified: Secondary | ICD-10-CM | POA: Diagnosis not present

## 2017-04-06 DIAGNOSIS — D689 Coagulation defect, unspecified: Secondary | ICD-10-CM | POA: Diagnosis not present

## 2017-04-06 DIAGNOSIS — E7800 Pure hypercholesterolemia, unspecified: Secondary | ICD-10-CM | POA: Diagnosis not present

## 2017-04-06 DIAGNOSIS — M6281 Muscle weakness (generalized): Secondary | ICD-10-CM | POA: Diagnosis not present

## 2017-04-07 DIAGNOSIS — R279 Unspecified lack of coordination: Secondary | ICD-10-CM | POA: Diagnosis not present

## 2017-04-07 DIAGNOSIS — F0390 Unspecified dementia without behavioral disturbance: Secondary | ICD-10-CM | POA: Diagnosis not present

## 2017-04-07 DIAGNOSIS — R41841 Cognitive communication deficit: Secondary | ICD-10-CM | POA: Diagnosis not present

## 2017-04-10 DIAGNOSIS — F0390 Unspecified dementia without behavioral disturbance: Secondary | ICD-10-CM | POA: Diagnosis not present

## 2017-04-10 DIAGNOSIS — R41841 Cognitive communication deficit: Secondary | ICD-10-CM | POA: Diagnosis not present

## 2017-04-10 DIAGNOSIS — R279 Unspecified lack of coordination: Secondary | ICD-10-CM | POA: Diagnosis not present

## 2017-04-11 DIAGNOSIS — R41841 Cognitive communication deficit: Secondary | ICD-10-CM | POA: Diagnosis not present

## 2017-04-11 DIAGNOSIS — R279 Unspecified lack of coordination: Secondary | ICD-10-CM | POA: Diagnosis not present

## 2017-04-11 DIAGNOSIS — F0390 Unspecified dementia without behavioral disturbance: Secondary | ICD-10-CM | POA: Diagnosis not present

## 2017-04-12 DIAGNOSIS — R279 Unspecified lack of coordination: Secondary | ICD-10-CM | POA: Diagnosis not present

## 2017-04-12 DIAGNOSIS — F0390 Unspecified dementia without behavioral disturbance: Secondary | ICD-10-CM | POA: Diagnosis not present

## 2017-04-12 DIAGNOSIS — R41841 Cognitive communication deficit: Secondary | ICD-10-CM | POA: Diagnosis not present

## 2017-04-13 DIAGNOSIS — F0390 Unspecified dementia without behavioral disturbance: Secondary | ICD-10-CM | POA: Diagnosis not present

## 2017-04-13 DIAGNOSIS — R279 Unspecified lack of coordination: Secondary | ICD-10-CM | POA: Diagnosis not present

## 2017-04-13 DIAGNOSIS — R41841 Cognitive communication deficit: Secondary | ICD-10-CM | POA: Diagnosis not present

## 2017-04-14 DIAGNOSIS — R41841 Cognitive communication deficit: Secondary | ICD-10-CM | POA: Diagnosis not present

## 2017-04-14 DIAGNOSIS — R279 Unspecified lack of coordination: Secondary | ICD-10-CM | POA: Diagnosis not present

## 2017-04-14 DIAGNOSIS — F0390 Unspecified dementia without behavioral disturbance: Secondary | ICD-10-CM | POA: Diagnosis not present

## 2017-04-16 DIAGNOSIS — R279 Unspecified lack of coordination: Secondary | ICD-10-CM | POA: Diagnosis not present

## 2017-04-16 DIAGNOSIS — F0390 Unspecified dementia without behavioral disturbance: Secondary | ICD-10-CM | POA: Diagnosis not present

## 2017-04-16 DIAGNOSIS — R41841 Cognitive communication deficit: Secondary | ICD-10-CM | POA: Diagnosis not present

## 2017-04-17 DIAGNOSIS — F0390 Unspecified dementia without behavioral disturbance: Secondary | ICD-10-CM | POA: Diagnosis not present

## 2017-04-17 DIAGNOSIS — R41841 Cognitive communication deficit: Secondary | ICD-10-CM | POA: Diagnosis not present

## 2017-04-17 DIAGNOSIS — R279 Unspecified lack of coordination: Secondary | ICD-10-CM | POA: Diagnosis not present

## 2017-04-18 DIAGNOSIS — R279 Unspecified lack of coordination: Secondary | ICD-10-CM | POA: Diagnosis not present

## 2017-04-18 DIAGNOSIS — R41841 Cognitive communication deficit: Secondary | ICD-10-CM | POA: Diagnosis not present

## 2017-04-18 DIAGNOSIS — F0390 Unspecified dementia without behavioral disturbance: Secondary | ICD-10-CM | POA: Diagnosis not present

## 2017-04-19 DIAGNOSIS — R41841 Cognitive communication deficit: Secondary | ICD-10-CM | POA: Diagnosis not present

## 2017-04-19 DIAGNOSIS — F0390 Unspecified dementia without behavioral disturbance: Secondary | ICD-10-CM | POA: Diagnosis not present

## 2017-04-19 DIAGNOSIS — R279 Unspecified lack of coordination: Secondary | ICD-10-CM | POA: Diagnosis not present

## 2017-04-20 DIAGNOSIS — R279 Unspecified lack of coordination: Secondary | ICD-10-CM | POA: Diagnosis not present

## 2017-04-20 DIAGNOSIS — Z79899 Other long term (current) drug therapy: Secondary | ICD-10-CM | POA: Diagnosis not present

## 2017-04-20 DIAGNOSIS — R41841 Cognitive communication deficit: Secondary | ICD-10-CM | POA: Diagnosis not present

## 2017-04-20 DIAGNOSIS — D649 Anemia, unspecified: Secondary | ICD-10-CM | POA: Diagnosis not present

## 2017-04-20 DIAGNOSIS — F0390 Unspecified dementia without behavioral disturbance: Secondary | ICD-10-CM | POA: Diagnosis not present

## 2017-04-21 DIAGNOSIS — F0390 Unspecified dementia without behavioral disturbance: Secondary | ICD-10-CM | POA: Diagnosis not present

## 2017-04-21 DIAGNOSIS — R41841 Cognitive communication deficit: Secondary | ICD-10-CM | POA: Diagnosis not present

## 2017-04-21 DIAGNOSIS — R279 Unspecified lack of coordination: Secondary | ICD-10-CM | POA: Diagnosis not present

## 2017-04-22 DIAGNOSIS — R279 Unspecified lack of coordination: Secondary | ICD-10-CM | POA: Diagnosis not present

## 2017-04-22 DIAGNOSIS — F0390 Unspecified dementia without behavioral disturbance: Secondary | ICD-10-CM | POA: Diagnosis not present

## 2017-04-22 DIAGNOSIS — R41841 Cognitive communication deficit: Secondary | ICD-10-CM | POA: Diagnosis not present

## 2017-04-24 DIAGNOSIS — F0390 Unspecified dementia without behavioral disturbance: Secondary | ICD-10-CM | POA: Diagnosis not present

## 2017-04-24 DIAGNOSIS — R279 Unspecified lack of coordination: Secondary | ICD-10-CM | POA: Diagnosis not present

## 2017-04-24 DIAGNOSIS — R41841 Cognitive communication deficit: Secondary | ICD-10-CM | POA: Diagnosis not present

## 2017-04-25 DIAGNOSIS — F0390 Unspecified dementia without behavioral disturbance: Secondary | ICD-10-CM | POA: Diagnosis not present

## 2017-04-25 DIAGNOSIS — R41841 Cognitive communication deficit: Secondary | ICD-10-CM | POA: Diagnosis not present

## 2017-04-25 DIAGNOSIS — R279 Unspecified lack of coordination: Secondary | ICD-10-CM | POA: Diagnosis not present

## 2017-04-26 DIAGNOSIS — R41841 Cognitive communication deficit: Secondary | ICD-10-CM | POA: Diagnosis not present

## 2017-04-26 DIAGNOSIS — R279 Unspecified lack of coordination: Secondary | ICD-10-CM | POA: Diagnosis not present

## 2017-04-26 DIAGNOSIS — F0390 Unspecified dementia without behavioral disturbance: Secondary | ICD-10-CM | POA: Diagnosis not present

## 2017-04-27 DIAGNOSIS — R279 Unspecified lack of coordination: Secondary | ICD-10-CM | POA: Diagnosis not present

## 2017-04-27 DIAGNOSIS — R41841 Cognitive communication deficit: Secondary | ICD-10-CM | POA: Diagnosis not present

## 2017-04-27 DIAGNOSIS — F0390 Unspecified dementia without behavioral disturbance: Secondary | ICD-10-CM | POA: Diagnosis not present

## 2017-04-28 DIAGNOSIS — F0390 Unspecified dementia without behavioral disturbance: Secondary | ICD-10-CM | POA: Diagnosis not present

## 2017-04-28 DIAGNOSIS — R279 Unspecified lack of coordination: Secondary | ICD-10-CM | POA: Diagnosis not present

## 2017-04-28 DIAGNOSIS — R41841 Cognitive communication deficit: Secondary | ICD-10-CM | POA: Diagnosis not present

## 2017-05-01 DIAGNOSIS — F0390 Unspecified dementia without behavioral disturbance: Secondary | ICD-10-CM | POA: Diagnosis not present

## 2017-05-01 DIAGNOSIS — R279 Unspecified lack of coordination: Secondary | ICD-10-CM | POA: Diagnosis not present

## 2017-05-01 DIAGNOSIS — R41841 Cognitive communication deficit: Secondary | ICD-10-CM | POA: Diagnosis not present

## 2017-05-02 DIAGNOSIS — R41841 Cognitive communication deficit: Secondary | ICD-10-CM | POA: Diagnosis not present

## 2017-05-02 DIAGNOSIS — R279 Unspecified lack of coordination: Secondary | ICD-10-CM | POA: Diagnosis not present

## 2017-05-02 DIAGNOSIS — F0390 Unspecified dementia without behavioral disturbance: Secondary | ICD-10-CM | POA: Diagnosis not present

## 2017-05-03 DIAGNOSIS — R279 Unspecified lack of coordination: Secondary | ICD-10-CM | POA: Diagnosis not present

## 2017-05-03 DIAGNOSIS — R41841 Cognitive communication deficit: Secondary | ICD-10-CM | POA: Diagnosis not present

## 2017-05-03 DIAGNOSIS — F0390 Unspecified dementia without behavioral disturbance: Secondary | ICD-10-CM | POA: Diagnosis not present

## 2017-05-04 DIAGNOSIS — R279 Unspecified lack of coordination: Secondary | ICD-10-CM | POA: Diagnosis not present

## 2017-05-04 DIAGNOSIS — R41841 Cognitive communication deficit: Secondary | ICD-10-CM | POA: Diagnosis not present

## 2017-05-04 DIAGNOSIS — F0390 Unspecified dementia without behavioral disturbance: Secondary | ICD-10-CM | POA: Diagnosis not present

## 2017-05-05 DIAGNOSIS — R279 Unspecified lack of coordination: Secondary | ICD-10-CM | POA: Diagnosis not present

## 2017-05-05 DIAGNOSIS — R41841 Cognitive communication deficit: Secondary | ICD-10-CM | POA: Diagnosis not present

## 2017-05-05 DIAGNOSIS — F0390 Unspecified dementia without behavioral disturbance: Secondary | ICD-10-CM | POA: Diagnosis not present

## 2017-05-08 DIAGNOSIS — F0390 Unspecified dementia without behavioral disturbance: Secondary | ICD-10-CM | POA: Diagnosis not present

## 2017-05-08 DIAGNOSIS — R41841 Cognitive communication deficit: Secondary | ICD-10-CM | POA: Diagnosis not present

## 2017-05-08 DIAGNOSIS — R279 Unspecified lack of coordination: Secondary | ICD-10-CM | POA: Diagnosis not present

## 2017-05-09 DIAGNOSIS — F0390 Unspecified dementia without behavioral disturbance: Secondary | ICD-10-CM | POA: Diagnosis not present

## 2017-05-09 DIAGNOSIS — R279 Unspecified lack of coordination: Secondary | ICD-10-CM | POA: Diagnosis not present

## 2017-05-09 DIAGNOSIS — R41841 Cognitive communication deficit: Secondary | ICD-10-CM | POA: Diagnosis not present

## 2017-05-10 DIAGNOSIS — R41841 Cognitive communication deficit: Secondary | ICD-10-CM | POA: Diagnosis not present

## 2017-05-10 DIAGNOSIS — F0390 Unspecified dementia without behavioral disturbance: Secondary | ICD-10-CM | POA: Diagnosis not present

## 2017-05-10 DIAGNOSIS — R279 Unspecified lack of coordination: Secondary | ICD-10-CM | POA: Diagnosis not present

## 2017-05-11 DIAGNOSIS — R279 Unspecified lack of coordination: Secondary | ICD-10-CM | POA: Diagnosis not present

## 2017-05-11 DIAGNOSIS — R41841 Cognitive communication deficit: Secondary | ICD-10-CM | POA: Diagnosis not present

## 2017-05-11 DIAGNOSIS — F0390 Unspecified dementia without behavioral disturbance: Secondary | ICD-10-CM | POA: Diagnosis not present

## 2017-05-12 DIAGNOSIS — R279 Unspecified lack of coordination: Secondary | ICD-10-CM | POA: Diagnosis not present

## 2017-05-12 DIAGNOSIS — F0390 Unspecified dementia without behavioral disturbance: Secondary | ICD-10-CM | POA: Diagnosis not present

## 2017-05-12 DIAGNOSIS — R41841 Cognitive communication deficit: Secondary | ICD-10-CM | POA: Diagnosis not present

## 2017-05-14 DIAGNOSIS — F0390 Unspecified dementia without behavioral disturbance: Secondary | ICD-10-CM | POA: Diagnosis not present

## 2017-05-14 DIAGNOSIS — R279 Unspecified lack of coordination: Secondary | ICD-10-CM | POA: Diagnosis not present

## 2017-05-14 DIAGNOSIS — R41841 Cognitive communication deficit: Secondary | ICD-10-CM | POA: Diagnosis not present

## 2017-05-15 DIAGNOSIS — R41841 Cognitive communication deficit: Secondary | ICD-10-CM | POA: Diagnosis not present

## 2017-05-15 DIAGNOSIS — R279 Unspecified lack of coordination: Secondary | ICD-10-CM | POA: Diagnosis not present

## 2017-05-15 DIAGNOSIS — F0390 Unspecified dementia without behavioral disturbance: Secondary | ICD-10-CM | POA: Diagnosis not present

## 2017-05-16 DIAGNOSIS — R279 Unspecified lack of coordination: Secondary | ICD-10-CM | POA: Diagnosis not present

## 2017-05-16 DIAGNOSIS — F0390 Unspecified dementia without behavioral disturbance: Secondary | ICD-10-CM | POA: Diagnosis not present

## 2017-05-16 DIAGNOSIS — R41841 Cognitive communication deficit: Secondary | ICD-10-CM | POA: Diagnosis not present

## 2017-05-19 DIAGNOSIS — F0390 Unspecified dementia without behavioral disturbance: Secondary | ICD-10-CM | POA: Diagnosis not present

## 2017-05-19 DIAGNOSIS — I10 Essential (primary) hypertension: Secondary | ICD-10-CM | POA: Diagnosis not present

## 2017-05-19 DIAGNOSIS — E785 Hyperlipidemia, unspecified: Secondary | ICD-10-CM | POA: Diagnosis not present

## 2017-05-19 DIAGNOSIS — M6281 Muscle weakness (generalized): Secondary | ICD-10-CM | POA: Diagnosis not present

## 2017-05-19 DIAGNOSIS — F329 Major depressive disorder, single episode, unspecified: Secondary | ICD-10-CM | POA: Diagnosis not present

## 2017-06-06 DIAGNOSIS — F0390 Unspecified dementia without behavioral disturbance: Secondary | ICD-10-CM | POA: Diagnosis not present

## 2017-06-06 DIAGNOSIS — F329 Major depressive disorder, single episode, unspecified: Secondary | ICD-10-CM | POA: Diagnosis not present

## 2017-06-06 DIAGNOSIS — M6281 Muscle weakness (generalized): Secondary | ICD-10-CM | POA: Diagnosis not present

## 2017-06-06 DIAGNOSIS — I10 Essential (primary) hypertension: Secondary | ICD-10-CM | POA: Diagnosis not present

## 2017-06-26 DIAGNOSIS — E785 Hyperlipidemia, unspecified: Secondary | ICD-10-CM | POA: Diagnosis not present

## 2017-06-26 DIAGNOSIS — M6281 Muscle weakness (generalized): Secondary | ICD-10-CM | POA: Diagnosis not present

## 2017-06-26 DIAGNOSIS — F329 Major depressive disorder, single episode, unspecified: Secondary | ICD-10-CM | POA: Diagnosis not present

## 2017-06-26 DIAGNOSIS — I10 Essential (primary) hypertension: Secondary | ICD-10-CM | POA: Diagnosis not present

## 2017-06-26 DIAGNOSIS — M5442 Lumbago with sciatica, left side: Secondary | ICD-10-CM | POA: Diagnosis not present

## 2017-07-02 DIAGNOSIS — F329 Major depressive disorder, single episode, unspecified: Secondary | ICD-10-CM | POA: Diagnosis not present

## 2017-07-02 DIAGNOSIS — M6281 Muscle weakness (generalized): Secondary | ICD-10-CM | POA: Diagnosis not present

## 2017-07-02 DIAGNOSIS — E785 Hyperlipidemia, unspecified: Secondary | ICD-10-CM | POA: Diagnosis not present

## 2017-07-02 DIAGNOSIS — I10 Essential (primary) hypertension: Secondary | ICD-10-CM | POA: Diagnosis not present

## 2017-07-04 DIAGNOSIS — F0390 Unspecified dementia without behavioral disturbance: Secondary | ICD-10-CM | POA: Diagnosis not present

## 2017-07-04 DIAGNOSIS — I10 Essential (primary) hypertension: Secondary | ICD-10-CM | POA: Diagnosis not present

## 2017-07-04 DIAGNOSIS — F329 Major depressive disorder, single episode, unspecified: Secondary | ICD-10-CM | POA: Diagnosis not present

## 2017-07-05 DIAGNOSIS — R41841 Cognitive communication deficit: Secondary | ICD-10-CM | POA: Diagnosis not present

## 2017-07-05 DIAGNOSIS — M6281 Muscle weakness (generalized): Secondary | ICD-10-CM | POA: Diagnosis not present

## 2017-07-05 DIAGNOSIS — F0390 Unspecified dementia without behavioral disturbance: Secondary | ICD-10-CM | POA: Diagnosis not present

## 2017-07-06 DIAGNOSIS — E559 Vitamin D deficiency, unspecified: Secondary | ICD-10-CM | POA: Diagnosis not present

## 2017-07-06 DIAGNOSIS — F0390 Unspecified dementia without behavioral disturbance: Secondary | ICD-10-CM | POA: Diagnosis not present

## 2017-07-06 DIAGNOSIS — R41841 Cognitive communication deficit: Secondary | ICD-10-CM | POA: Diagnosis not present

## 2017-07-06 DIAGNOSIS — M6281 Muscle weakness (generalized): Secondary | ICD-10-CM | POA: Diagnosis not present

## 2017-07-07 DIAGNOSIS — R41841 Cognitive communication deficit: Secondary | ICD-10-CM | POA: Diagnosis not present

## 2017-07-07 DIAGNOSIS — M6281 Muscle weakness (generalized): Secondary | ICD-10-CM | POA: Diagnosis not present

## 2017-07-07 DIAGNOSIS — F0390 Unspecified dementia without behavioral disturbance: Secondary | ICD-10-CM | POA: Diagnosis not present

## 2017-07-08 DIAGNOSIS — F0390 Unspecified dementia without behavioral disturbance: Secondary | ICD-10-CM | POA: Diagnosis not present

## 2017-07-08 DIAGNOSIS — R41841 Cognitive communication deficit: Secondary | ICD-10-CM | POA: Diagnosis not present

## 2017-07-08 DIAGNOSIS — M6281 Muscle weakness (generalized): Secondary | ICD-10-CM | POA: Diagnosis not present

## 2017-07-10 DIAGNOSIS — R41841 Cognitive communication deficit: Secondary | ICD-10-CM | POA: Diagnosis not present

## 2017-07-10 DIAGNOSIS — M6281 Muscle weakness (generalized): Secondary | ICD-10-CM | POA: Diagnosis not present

## 2017-07-10 DIAGNOSIS — F0390 Unspecified dementia without behavioral disturbance: Secondary | ICD-10-CM | POA: Diagnosis not present

## 2017-07-10 DIAGNOSIS — M25551 Pain in right hip: Secondary | ICD-10-CM | POA: Diagnosis not present

## 2017-07-10 DIAGNOSIS — W19XXXA Unspecified fall, initial encounter: Secondary | ICD-10-CM | POA: Diagnosis not present

## 2017-07-11 DIAGNOSIS — R41841 Cognitive communication deficit: Secondary | ICD-10-CM | POA: Diagnosis not present

## 2017-07-11 DIAGNOSIS — M6281 Muscle weakness (generalized): Secondary | ICD-10-CM | POA: Diagnosis not present

## 2017-07-11 DIAGNOSIS — F0390 Unspecified dementia without behavioral disturbance: Secondary | ICD-10-CM | POA: Diagnosis not present

## 2017-07-12 DIAGNOSIS — R41841 Cognitive communication deficit: Secondary | ICD-10-CM | POA: Diagnosis not present

## 2017-07-12 DIAGNOSIS — M6281 Muscle weakness (generalized): Secondary | ICD-10-CM | POA: Diagnosis not present

## 2017-07-12 DIAGNOSIS — F0390 Unspecified dementia without behavioral disturbance: Secondary | ICD-10-CM | POA: Diagnosis not present

## 2017-07-13 DIAGNOSIS — M6281 Muscle weakness (generalized): Secondary | ICD-10-CM | POA: Diagnosis not present

## 2017-07-13 DIAGNOSIS — F0390 Unspecified dementia without behavioral disturbance: Secondary | ICD-10-CM | POA: Diagnosis not present

## 2017-07-13 DIAGNOSIS — R41841 Cognitive communication deficit: Secondary | ICD-10-CM | POA: Diagnosis not present

## 2017-07-14 DIAGNOSIS — F0390 Unspecified dementia without behavioral disturbance: Secondary | ICD-10-CM | POA: Diagnosis not present

## 2017-07-14 DIAGNOSIS — M6281 Muscle weakness (generalized): Secondary | ICD-10-CM | POA: Diagnosis not present

## 2017-07-14 DIAGNOSIS — R41841 Cognitive communication deficit: Secondary | ICD-10-CM | POA: Diagnosis not present

## 2017-07-17 DIAGNOSIS — M6281 Muscle weakness (generalized): Secondary | ICD-10-CM | POA: Diagnosis not present

## 2017-07-17 DIAGNOSIS — F0390 Unspecified dementia without behavioral disturbance: Secondary | ICD-10-CM | POA: Diagnosis not present

## 2017-07-18 DIAGNOSIS — F0390 Unspecified dementia without behavioral disturbance: Secondary | ICD-10-CM | POA: Diagnosis not present

## 2017-07-18 DIAGNOSIS — M6281 Muscle weakness (generalized): Secondary | ICD-10-CM | POA: Diagnosis not present

## 2017-07-19 DIAGNOSIS — M6281 Muscle weakness (generalized): Secondary | ICD-10-CM | POA: Diagnosis not present

## 2017-07-19 DIAGNOSIS — F0390 Unspecified dementia without behavioral disturbance: Secondary | ICD-10-CM | POA: Diagnosis not present

## 2017-07-20 DIAGNOSIS — M6281 Muscle weakness (generalized): Secondary | ICD-10-CM | POA: Diagnosis not present

## 2017-07-20 DIAGNOSIS — F0390 Unspecified dementia without behavioral disturbance: Secondary | ICD-10-CM | POA: Diagnosis not present

## 2017-07-21 DIAGNOSIS — F0390 Unspecified dementia without behavioral disturbance: Secondary | ICD-10-CM | POA: Diagnosis not present

## 2017-07-21 DIAGNOSIS — M6281 Muscle weakness (generalized): Secondary | ICD-10-CM | POA: Diagnosis not present

## 2017-07-23 DIAGNOSIS — M6281 Muscle weakness (generalized): Secondary | ICD-10-CM | POA: Diagnosis not present

## 2017-07-23 DIAGNOSIS — F0390 Unspecified dementia without behavioral disturbance: Secondary | ICD-10-CM | POA: Diagnosis not present

## 2017-07-24 DIAGNOSIS — M6281 Muscle weakness (generalized): Secondary | ICD-10-CM | POA: Diagnosis not present

## 2017-07-24 DIAGNOSIS — F0390 Unspecified dementia without behavioral disturbance: Secondary | ICD-10-CM | POA: Diagnosis not present

## 2017-07-25 DIAGNOSIS — M6281 Muscle weakness (generalized): Secondary | ICD-10-CM | POA: Diagnosis not present

## 2017-07-25 DIAGNOSIS — R279 Unspecified lack of coordination: Secondary | ICD-10-CM | POA: Diagnosis not present

## 2017-07-25 DIAGNOSIS — W07XXXA Fall from chair, initial encounter: Secondary | ICD-10-CM | POA: Diagnosis not present

## 2017-07-25 DIAGNOSIS — F0390 Unspecified dementia without behavioral disturbance: Secondary | ICD-10-CM | POA: Diagnosis not present

## 2017-07-25 DIAGNOSIS — E440 Moderate protein-calorie malnutrition: Secondary | ICD-10-CM | POA: Diagnosis not present

## 2017-07-25 DIAGNOSIS — F329 Major depressive disorder, single episode, unspecified: Secondary | ICD-10-CM | POA: Diagnosis not present

## 2017-07-26 DIAGNOSIS — M6281 Muscle weakness (generalized): Secondary | ICD-10-CM | POA: Diagnosis not present

## 2017-07-26 DIAGNOSIS — F0390 Unspecified dementia without behavioral disturbance: Secondary | ICD-10-CM | POA: Diagnosis not present

## 2017-07-27 DIAGNOSIS — M6281 Muscle weakness (generalized): Secondary | ICD-10-CM | POA: Diagnosis not present

## 2017-07-27 DIAGNOSIS — F0390 Unspecified dementia without behavioral disturbance: Secondary | ICD-10-CM | POA: Diagnosis not present

## 2017-07-28 DIAGNOSIS — F0390 Unspecified dementia without behavioral disturbance: Secondary | ICD-10-CM | POA: Diagnosis not present

## 2017-07-28 DIAGNOSIS — M6281 Muscle weakness (generalized): Secondary | ICD-10-CM | POA: Diagnosis not present

## 2017-07-31 DIAGNOSIS — F0390 Unspecified dementia without behavioral disturbance: Secondary | ICD-10-CM | POA: Diagnosis not present

## 2017-07-31 DIAGNOSIS — M6281 Muscle weakness (generalized): Secondary | ICD-10-CM | POA: Diagnosis not present

## 2017-08-01 DIAGNOSIS — F0390 Unspecified dementia without behavioral disturbance: Secondary | ICD-10-CM | POA: Diagnosis not present

## 2017-08-01 DIAGNOSIS — M6281 Muscle weakness (generalized): Secondary | ICD-10-CM | POA: Diagnosis not present

## 2017-08-02 DIAGNOSIS — F0390 Unspecified dementia without behavioral disturbance: Secondary | ICD-10-CM | POA: Diagnosis not present

## 2017-08-02 DIAGNOSIS — M6281 Muscle weakness (generalized): Secondary | ICD-10-CM | POA: Diagnosis not present

## 2017-08-03 DIAGNOSIS — F0390 Unspecified dementia without behavioral disturbance: Secondary | ICD-10-CM | POA: Diagnosis not present

## 2017-08-03 DIAGNOSIS — M6281 Muscle weakness (generalized): Secondary | ICD-10-CM | POA: Diagnosis not present

## 2017-08-04 DIAGNOSIS — F0390 Unspecified dementia without behavioral disturbance: Secondary | ICD-10-CM | POA: Diagnosis not present

## 2017-08-04 DIAGNOSIS — M6281 Muscle weakness (generalized): Secondary | ICD-10-CM | POA: Diagnosis not present

## 2017-08-07 DIAGNOSIS — F0390 Unspecified dementia without behavioral disturbance: Secondary | ICD-10-CM | POA: Diagnosis not present

## 2017-08-07 DIAGNOSIS — M6281 Muscle weakness (generalized): Secondary | ICD-10-CM | POA: Diagnosis not present

## 2017-08-08 DIAGNOSIS — F0390 Unspecified dementia without behavioral disturbance: Secondary | ICD-10-CM | POA: Diagnosis not present

## 2017-08-08 DIAGNOSIS — M6281 Muscle weakness (generalized): Secondary | ICD-10-CM | POA: Diagnosis not present

## 2017-08-09 DIAGNOSIS — M6281 Muscle weakness (generalized): Secondary | ICD-10-CM | POA: Diagnosis not present

## 2017-08-09 DIAGNOSIS — F0390 Unspecified dementia without behavioral disturbance: Secondary | ICD-10-CM | POA: Diagnosis not present

## 2017-08-10 DIAGNOSIS — M6281 Muscle weakness (generalized): Secondary | ICD-10-CM | POA: Diagnosis not present

## 2017-08-10 DIAGNOSIS — F0390 Unspecified dementia without behavioral disturbance: Secondary | ICD-10-CM | POA: Diagnosis not present

## 2017-08-11 DIAGNOSIS — F0390 Unspecified dementia without behavioral disturbance: Secondary | ICD-10-CM | POA: Diagnosis not present

## 2017-08-11 DIAGNOSIS — M6281 Muscle weakness (generalized): Secondary | ICD-10-CM | POA: Diagnosis not present

## 2017-08-14 DIAGNOSIS — F0390 Unspecified dementia without behavioral disturbance: Secondary | ICD-10-CM | POA: Diagnosis not present

## 2017-08-14 DIAGNOSIS — M6281 Muscle weakness (generalized): Secondary | ICD-10-CM | POA: Diagnosis not present

## 2017-08-15 DIAGNOSIS — F0390 Unspecified dementia without behavioral disturbance: Secondary | ICD-10-CM | POA: Diagnosis not present

## 2017-08-15 DIAGNOSIS — M6281 Muscle weakness (generalized): Secondary | ICD-10-CM | POA: Diagnosis not present

## 2017-08-16 DIAGNOSIS — F0390 Unspecified dementia without behavioral disturbance: Secondary | ICD-10-CM | POA: Diagnosis not present

## 2017-08-16 DIAGNOSIS — M6281 Muscle weakness (generalized): Secondary | ICD-10-CM | POA: Diagnosis not present

## 2017-08-17 DIAGNOSIS — F0390 Unspecified dementia without behavioral disturbance: Secondary | ICD-10-CM | POA: Diagnosis not present

## 2017-08-17 DIAGNOSIS — M6281 Muscle weakness (generalized): Secondary | ICD-10-CM | POA: Diagnosis not present

## 2017-08-18 DIAGNOSIS — M6281 Muscle weakness (generalized): Secondary | ICD-10-CM | POA: Diagnosis not present

## 2017-08-18 DIAGNOSIS — F0390 Unspecified dementia without behavioral disturbance: Secondary | ICD-10-CM | POA: Diagnosis not present

## 2017-08-20 DIAGNOSIS — M6281 Muscle weakness (generalized): Secondary | ICD-10-CM | POA: Diagnosis not present

## 2017-08-20 DIAGNOSIS — F0390 Unspecified dementia without behavioral disturbance: Secondary | ICD-10-CM | POA: Diagnosis not present

## 2017-08-21 DIAGNOSIS — F0390 Unspecified dementia without behavioral disturbance: Secondary | ICD-10-CM | POA: Diagnosis not present

## 2017-08-21 DIAGNOSIS — M6281 Muscle weakness (generalized): Secondary | ICD-10-CM | POA: Diagnosis not present

## 2017-08-22 DIAGNOSIS — M6281 Muscle weakness (generalized): Secondary | ICD-10-CM | POA: Diagnosis not present

## 2017-08-22 DIAGNOSIS — F0390 Unspecified dementia without behavioral disturbance: Secondary | ICD-10-CM | POA: Diagnosis not present

## 2017-08-23 DIAGNOSIS — F0390 Unspecified dementia without behavioral disturbance: Secondary | ICD-10-CM | POA: Diagnosis not present

## 2017-08-23 DIAGNOSIS — M6281 Muscle weakness (generalized): Secondary | ICD-10-CM | POA: Diagnosis not present

## 2017-08-24 DIAGNOSIS — F0390 Unspecified dementia without behavioral disturbance: Secondary | ICD-10-CM | POA: Diagnosis not present

## 2017-08-24 DIAGNOSIS — M6281 Muscle weakness (generalized): Secondary | ICD-10-CM | POA: Diagnosis not present

## 2017-08-25 DIAGNOSIS — M6281 Muscle weakness (generalized): Secondary | ICD-10-CM | POA: Diagnosis not present

## 2017-08-25 DIAGNOSIS — F0390 Unspecified dementia without behavioral disturbance: Secondary | ICD-10-CM | POA: Diagnosis not present

## 2017-08-28 DIAGNOSIS — F0390 Unspecified dementia without behavioral disturbance: Secondary | ICD-10-CM | POA: Diagnosis not present

## 2017-08-28 DIAGNOSIS — M6281 Muscle weakness (generalized): Secondary | ICD-10-CM | POA: Diagnosis not present

## 2017-08-29 DIAGNOSIS — M6281 Muscle weakness (generalized): Secondary | ICD-10-CM | POA: Diagnosis not present

## 2017-08-29 DIAGNOSIS — F0390 Unspecified dementia without behavioral disturbance: Secondary | ICD-10-CM | POA: Diagnosis not present

## 2017-08-29 DIAGNOSIS — E785 Hyperlipidemia, unspecified: Secondary | ICD-10-CM | POA: Diagnosis not present

## 2017-08-29 DIAGNOSIS — I10 Essential (primary) hypertension: Secondary | ICD-10-CM | POA: Diagnosis not present

## 2017-08-29 DIAGNOSIS — F329 Major depressive disorder, single episode, unspecified: Secondary | ICD-10-CM | POA: Diagnosis not present

## 2017-08-30 DIAGNOSIS — F0390 Unspecified dementia without behavioral disturbance: Secondary | ICD-10-CM | POA: Diagnosis not present

## 2017-08-30 DIAGNOSIS — M6281 Muscle weakness (generalized): Secondary | ICD-10-CM | POA: Diagnosis not present

## 2017-09-01 DIAGNOSIS — F0390 Unspecified dementia without behavioral disturbance: Secondary | ICD-10-CM | POA: Diagnosis not present

## 2017-09-01 DIAGNOSIS — M6281 Muscle weakness (generalized): Secondary | ICD-10-CM | POA: Diagnosis not present

## 2017-09-04 DIAGNOSIS — F0390 Unspecified dementia without behavioral disturbance: Secondary | ICD-10-CM | POA: Diagnosis not present

## 2017-09-04 DIAGNOSIS — M6281 Muscle weakness (generalized): Secondary | ICD-10-CM | POA: Diagnosis not present

## 2017-09-05 DIAGNOSIS — F0390 Unspecified dementia without behavioral disturbance: Secondary | ICD-10-CM | POA: Diagnosis not present

## 2017-09-05 DIAGNOSIS — M6281 Muscle weakness (generalized): Secondary | ICD-10-CM | POA: Diagnosis not present

## 2017-09-06 DIAGNOSIS — M6281 Muscle weakness (generalized): Secondary | ICD-10-CM | POA: Diagnosis not present

## 2017-09-06 DIAGNOSIS — F0390 Unspecified dementia without behavioral disturbance: Secondary | ICD-10-CM | POA: Diagnosis not present

## 2017-09-08 DIAGNOSIS — F0390 Unspecified dementia without behavioral disturbance: Secondary | ICD-10-CM | POA: Diagnosis not present

## 2017-09-08 DIAGNOSIS — M6281 Muscle weakness (generalized): Secondary | ICD-10-CM | POA: Diagnosis not present

## 2017-09-09 DIAGNOSIS — F0390 Unspecified dementia without behavioral disturbance: Secondary | ICD-10-CM | POA: Diagnosis not present

## 2017-09-09 DIAGNOSIS — M6281 Muscle weakness (generalized): Secondary | ICD-10-CM | POA: Diagnosis not present

## 2017-09-11 DIAGNOSIS — M6281 Muscle weakness (generalized): Secondary | ICD-10-CM | POA: Diagnosis not present

## 2017-09-11 DIAGNOSIS — F0390 Unspecified dementia without behavioral disturbance: Secondary | ICD-10-CM | POA: Diagnosis not present

## 2017-09-12 DIAGNOSIS — M6281 Muscle weakness (generalized): Secondary | ICD-10-CM | POA: Diagnosis not present

## 2017-09-12 DIAGNOSIS — F0390 Unspecified dementia without behavioral disturbance: Secondary | ICD-10-CM | POA: Diagnosis not present

## 2017-09-13 DIAGNOSIS — F0390 Unspecified dementia without behavioral disturbance: Secondary | ICD-10-CM | POA: Diagnosis not present

## 2017-09-13 DIAGNOSIS — M6281 Muscle weakness (generalized): Secondary | ICD-10-CM | POA: Diagnosis not present

## 2017-09-14 DIAGNOSIS — M6281 Muscle weakness (generalized): Secondary | ICD-10-CM | POA: Diagnosis not present

## 2017-09-14 DIAGNOSIS — F0390 Unspecified dementia without behavioral disturbance: Secondary | ICD-10-CM | POA: Diagnosis not present

## 2017-09-16 DIAGNOSIS — F0390 Unspecified dementia without behavioral disturbance: Secondary | ICD-10-CM | POA: Diagnosis not present

## 2017-09-16 DIAGNOSIS — M25551 Pain in right hip: Secondary | ICD-10-CM | POA: Diagnosis not present

## 2017-09-16 DIAGNOSIS — M545 Low back pain: Secondary | ICD-10-CM | POA: Diagnosis not present

## 2017-09-16 DIAGNOSIS — W19XXXA Unspecified fall, initial encounter: Secondary | ICD-10-CM | POA: Diagnosis not present

## 2017-09-16 DIAGNOSIS — M25552 Pain in left hip: Secondary | ICD-10-CM | POA: Diagnosis not present

## 2017-09-18 DIAGNOSIS — F0390 Unspecified dementia without behavioral disturbance: Secondary | ICD-10-CM | POA: Diagnosis not present

## 2017-09-19 DIAGNOSIS — F0390 Unspecified dementia without behavioral disturbance: Secondary | ICD-10-CM | POA: Diagnosis not present

## 2017-09-21 DIAGNOSIS — J069 Acute upper respiratory infection, unspecified: Secondary | ICD-10-CM | POA: Diagnosis not present

## 2017-09-21 DIAGNOSIS — M1990 Unspecified osteoarthritis, unspecified site: Secondary | ICD-10-CM | POA: Diagnosis not present

## 2017-09-21 DIAGNOSIS — E440 Moderate protein-calorie malnutrition: Secondary | ICD-10-CM | POA: Diagnosis not present

## 2017-09-21 DIAGNOSIS — F0390 Unspecified dementia without behavioral disturbance: Secondary | ICD-10-CM | POA: Diagnosis not present

## 2017-09-21 DIAGNOSIS — W07XXXA Fall from chair, initial encounter: Secondary | ICD-10-CM | POA: Diagnosis not present

## 2017-09-22 DIAGNOSIS — F0390 Unspecified dementia without behavioral disturbance: Secondary | ICD-10-CM | POA: Diagnosis not present

## 2017-09-26 DIAGNOSIS — F0390 Unspecified dementia without behavioral disturbance: Secondary | ICD-10-CM | POA: Diagnosis not present

## 2017-09-27 DIAGNOSIS — F0390 Unspecified dementia without behavioral disturbance: Secondary | ICD-10-CM | POA: Diagnosis not present

## 2017-10-09 DIAGNOSIS — F0390 Unspecified dementia without behavioral disturbance: Secondary | ICD-10-CM | POA: Diagnosis not present

## 2017-10-11 DIAGNOSIS — F0390 Unspecified dementia without behavioral disturbance: Secondary | ICD-10-CM | POA: Diagnosis not present

## 2017-10-12 DIAGNOSIS — F0390 Unspecified dementia without behavioral disturbance: Secondary | ICD-10-CM | POA: Diagnosis not present

## 2017-10-12 DIAGNOSIS — Z9181 History of falling: Secondary | ICD-10-CM | POA: Diagnosis not present

## 2017-10-12 DIAGNOSIS — J069 Acute upper respiratory infection, unspecified: Secondary | ICD-10-CM | POA: Diagnosis not present

## 2017-10-12 DIAGNOSIS — R279 Unspecified lack of coordination: Secondary | ICD-10-CM | POA: Diagnosis not present

## 2017-10-13 DIAGNOSIS — F0390 Unspecified dementia without behavioral disturbance: Secondary | ICD-10-CM | POA: Diagnosis not present

## 2017-10-14 DIAGNOSIS — F0390 Unspecified dementia without behavioral disturbance: Secondary | ICD-10-CM | POA: Diagnosis not present

## 2017-10-16 DIAGNOSIS — F0390 Unspecified dementia without behavioral disturbance: Secondary | ICD-10-CM | POA: Diagnosis not present

## 2017-10-17 DIAGNOSIS — F0390 Unspecified dementia without behavioral disturbance: Secondary | ICD-10-CM | POA: Diagnosis not present

## 2017-10-18 DIAGNOSIS — F0390 Unspecified dementia without behavioral disturbance: Secondary | ICD-10-CM | POA: Diagnosis not present

## 2017-10-19 DIAGNOSIS — F0390 Unspecified dementia without behavioral disturbance: Secondary | ICD-10-CM | POA: Diagnosis not present

## 2017-10-20 DIAGNOSIS — F0390 Unspecified dementia without behavioral disturbance: Secondary | ICD-10-CM | POA: Diagnosis not present

## 2017-10-22 DIAGNOSIS — F0390 Unspecified dementia without behavioral disturbance: Secondary | ICD-10-CM | POA: Diagnosis not present

## 2017-10-23 DIAGNOSIS — F0390 Unspecified dementia without behavioral disturbance: Secondary | ICD-10-CM | POA: Diagnosis not present

## 2017-10-24 DIAGNOSIS — F0390 Unspecified dementia without behavioral disturbance: Secondary | ICD-10-CM | POA: Diagnosis not present

## 2017-10-25 DIAGNOSIS — F0390 Unspecified dementia without behavioral disturbance: Secondary | ICD-10-CM | POA: Diagnosis not present

## 2017-11-10 DIAGNOSIS — F0391 Unspecified dementia with behavioral disturbance: Secondary | ICD-10-CM | POA: Diagnosis not present

## 2017-11-10 DIAGNOSIS — F6089 Other specific personality disorders: Secondary | ICD-10-CM | POA: Diagnosis not present

## 2017-11-13 DIAGNOSIS — F0390 Unspecified dementia without behavioral disturbance: Secondary | ICD-10-CM | POA: Diagnosis not present

## 2017-11-14 DIAGNOSIS — F0390 Unspecified dementia without behavioral disturbance: Secondary | ICD-10-CM | POA: Diagnosis not present

## 2017-11-15 DIAGNOSIS — F0390 Unspecified dementia without behavioral disturbance: Secondary | ICD-10-CM | POA: Diagnosis not present

## 2017-11-15 DIAGNOSIS — D649 Anemia, unspecified: Secondary | ICD-10-CM | POA: Diagnosis not present

## 2017-11-15 DIAGNOSIS — N189 Chronic kidney disease, unspecified: Secondary | ICD-10-CM | POA: Diagnosis not present

## 2017-11-15 DIAGNOSIS — Z79899 Other long term (current) drug therapy: Secondary | ICD-10-CM | POA: Diagnosis not present

## 2017-11-16 DIAGNOSIS — F0390 Unspecified dementia without behavioral disturbance: Secondary | ICD-10-CM | POA: Diagnosis not present

## 2017-11-16 DIAGNOSIS — I10 Essential (primary) hypertension: Secondary | ICD-10-CM | POA: Diagnosis not present

## 2017-11-16 DIAGNOSIS — F329 Major depressive disorder, single episode, unspecified: Secondary | ICD-10-CM | POA: Diagnosis not present

## 2017-11-16 DIAGNOSIS — M6281 Muscle weakness (generalized): Secondary | ICD-10-CM | POA: Diagnosis not present

## 2017-11-16 DIAGNOSIS — E785 Hyperlipidemia, unspecified: Secondary | ICD-10-CM | POA: Diagnosis not present

## 2017-11-17 DIAGNOSIS — F0390 Unspecified dementia without behavioral disturbance: Secondary | ICD-10-CM | POA: Diagnosis not present

## 2017-11-17 DIAGNOSIS — M6281 Muscle weakness (generalized): Secondary | ICD-10-CM | POA: Diagnosis not present

## 2017-11-20 DIAGNOSIS — F0390 Unspecified dementia without behavioral disturbance: Secondary | ICD-10-CM | POA: Diagnosis not present

## 2017-11-20 DIAGNOSIS — M6281 Muscle weakness (generalized): Secondary | ICD-10-CM | POA: Diagnosis not present

## 2017-11-21 DIAGNOSIS — M6281 Muscle weakness (generalized): Secondary | ICD-10-CM | POA: Diagnosis not present

## 2017-11-21 DIAGNOSIS — F0390 Unspecified dementia without behavioral disturbance: Secondary | ICD-10-CM | POA: Diagnosis not present

## 2017-11-23 DIAGNOSIS — F0390 Unspecified dementia without behavioral disturbance: Secondary | ICD-10-CM | POA: Diagnosis not present

## 2017-11-23 DIAGNOSIS — M6281 Muscle weakness (generalized): Secondary | ICD-10-CM | POA: Diagnosis not present

## 2017-11-24 DIAGNOSIS — F0390 Unspecified dementia without behavioral disturbance: Secondary | ICD-10-CM | POA: Diagnosis not present

## 2017-11-24 DIAGNOSIS — M6281 Muscle weakness (generalized): Secondary | ICD-10-CM | POA: Diagnosis not present

## 2017-11-25 DIAGNOSIS — M6281 Muscle weakness (generalized): Secondary | ICD-10-CM | POA: Diagnosis not present

## 2017-11-25 DIAGNOSIS — F0390 Unspecified dementia without behavioral disturbance: Secondary | ICD-10-CM | POA: Diagnosis not present

## 2017-11-27 DIAGNOSIS — M6281 Muscle weakness (generalized): Secondary | ICD-10-CM | POA: Diagnosis not present

## 2017-11-27 DIAGNOSIS — F0390 Unspecified dementia without behavioral disturbance: Secondary | ICD-10-CM | POA: Diagnosis not present

## 2017-11-28 DIAGNOSIS — F6089 Other specific personality disorders: Secondary | ICD-10-CM | POA: Diagnosis not present

## 2017-11-28 DIAGNOSIS — F0390 Unspecified dementia without behavioral disturbance: Secondary | ICD-10-CM | POA: Diagnosis not present

## 2017-11-28 DIAGNOSIS — M6281 Muscle weakness (generalized): Secondary | ICD-10-CM | POA: Diagnosis not present

## 2017-11-28 DIAGNOSIS — F0391 Unspecified dementia with behavioral disturbance: Secondary | ICD-10-CM | POA: Diagnosis not present

## 2017-11-28 DIAGNOSIS — F22 Delusional disorders: Secondary | ICD-10-CM | POA: Diagnosis not present

## 2017-11-29 ENCOUNTER — Other Ambulatory Visit: Payer: Self-pay

## 2017-11-29 ENCOUNTER — Emergency Department (HOSPITAL_COMMUNITY)
Admission: EM | Admit: 2017-11-29 | Discharge: 2017-11-29 | Disposition: A | Discharge status: Home / Self Care | Payer: Medicare Other | Attending: Emergency Medicine | Admitting: Emergency Medicine

## 2017-11-29 DIAGNOSIS — R279 Unspecified lack of coordination: Secondary | ICD-10-CM | POA: Diagnosis not present

## 2017-11-29 DIAGNOSIS — N390 Urinary tract infection, site not specified: Secondary | ICD-10-CM | POA: Insufficient documentation

## 2017-11-29 DIAGNOSIS — F29 Unspecified psychosis not due to a substance or known physiological condition: Secondary | ICD-10-CM | POA: Diagnosis not present

## 2017-11-29 DIAGNOSIS — F329 Major depressive disorder, single episode, unspecified: Secondary | ICD-10-CM | POA: Diagnosis not present

## 2017-11-29 DIAGNOSIS — Z7401 Bed confinement status: Secondary | ICD-10-CM | POA: Diagnosis not present

## 2017-11-29 DIAGNOSIS — F0391 Unspecified dementia with behavioral disturbance: Secondary | ICD-10-CM | POA: Diagnosis not present

## 2017-11-29 DIAGNOSIS — I10 Essential (primary) hypertension: Secondary | ICD-10-CM | POA: Diagnosis not present

## 2017-11-29 DIAGNOSIS — F0390 Unspecified dementia without behavioral disturbance: Secondary | ICD-10-CM | POA: Diagnosis not present

## 2017-11-29 DIAGNOSIS — M6281 Muscle weakness (generalized): Secondary | ICD-10-CM | POA: Diagnosis not present

## 2017-11-29 DIAGNOSIS — F6089 Other specific personality disorders: Secondary | ICD-10-CM | POA: Diagnosis not present

## 2017-11-29 LAB — CBC WITH DIFFERENTIAL/PLATELET
BASOS ABS: 0 10*3/uL (ref 0.0–0.1)
BASOS PCT: 0 %
EOS PCT: 2 %
Eosinophils Absolute: 0.1 10*3/uL (ref 0.0–0.7)
HEMATOCRIT: 38.2 % (ref 36.0–46.0)
Hemoglobin: 11.9 g/dL — ABNORMAL LOW (ref 12.0–15.0)
Lymphocytes Relative: 14 %
Lymphs Abs: 1.1 10*3/uL (ref 0.7–4.0)
MCH: 26.7 pg (ref 26.0–34.0)
MCHC: 31.2 g/dL (ref 30.0–36.0)
MCV: 85.7 fL (ref 78.0–100.0)
MONO ABS: 0.6 10*3/uL (ref 0.1–1.0)
MONOS PCT: 7 %
NEUTROS ABS: 6.2 10*3/uL (ref 1.7–7.7)
Neutrophils Relative %: 77 %
PLATELETS: 274 10*3/uL (ref 150–400)
RBC: 4.46 MIL/uL (ref 3.87–5.11)
RDW: 15.4 % (ref 11.5–15.5)
WBC: 8 10*3/uL (ref 4.0–10.5)

## 2017-11-29 LAB — URINALYSIS, ROUTINE W REFLEX MICROSCOPIC
Bilirubin Urine: NEGATIVE
GLUCOSE, UA: NEGATIVE mg/dL
Hgb urine dipstick: NEGATIVE
Ketones, ur: NEGATIVE mg/dL
NITRITE: POSITIVE — AB
PROTEIN: NEGATIVE mg/dL
Specific Gravity, Urine: 1.011 (ref 1.005–1.030)
pH: 6 (ref 5.0–8.0)

## 2017-11-29 LAB — COMPREHENSIVE METABOLIC PANEL
ALBUMIN: 3.4 g/dL — AB (ref 3.5–5.0)
ALK PHOS: 92 U/L (ref 38–126)
ALT: 12 U/L — AB (ref 14–54)
ANION GAP: 10 (ref 5–15)
AST: 15 U/L (ref 15–41)
BILIRUBIN TOTAL: 0.3 mg/dL (ref 0.3–1.2)
BUN: 16 mg/dL (ref 6–20)
CALCIUM: 8.3 mg/dL — AB (ref 8.9–10.3)
CO2: 24 mmol/L (ref 22–32)
CREATININE: 0.63 mg/dL (ref 0.44–1.00)
Chloride: 99 mmol/L — ABNORMAL LOW (ref 101–111)
GFR calc Af Amer: >60 mL/min (ref >60)
GFR calc non Af Amer: >60 mL/min (ref >60)
GLUCOSE: 97 mg/dL (ref 65–99)
Potassium: 4.2 mmol/L (ref 3.5–5.1)
SODIUM: 133 mmol/L — AB (ref 135–145)
TOTAL PROTEIN: 6.5 g/dL (ref 6.5–8.1)

## 2017-11-29 MED ORDER — CEPHALEXIN 500 MG PO CAPS
500.0000 mg | ORAL_CAPSULE | Freq: Once | ORAL | Status: AC
  Administered 2017-11-29: 500 mg via ORAL
  Filled 2017-11-29: qty 1

## 2017-11-29 MED ORDER — CEPHALEXIN 500 MG PO CAPS: 500.0000 mg | ORAL_CAPSULE | Freq: Two times a day (BID) | ORAL | 0 refills | Status: AC

## 2017-11-29 NOTE — Discharge Instructions (Signed)
Keflex 500 mg by mouth twice a day for the next 7 days Follow-up within 2-3 days with the family doctor if no improvement Emergency department for fevers vomiting or worsening mental status Sandra Hamilton has been very calm and interactive and appropriate in the emergency department.  She does not appear to have a psychiatric emergency
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
## 2017-11-29 NOTE — ED Notes (Signed)
Pt assisted to bathroom via wheelchair by NT Tech. EDP reported pt to be discharged once ambulates, eats and labs return.
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
## 2017-11-29 NOTE — ED Notes (Signed)
Talked with patient's nurse at [facility], [place]. Patient's baseline is wheelchair bound.   Patient's RN also mentioned that patient had 3 falls yesterday when trying to get up from wheelchair.

## 2017-11-29 NOTE — ED Notes (Signed)
Called EMS regarding convalescent transport. EMS reported would send an ambulance once one is available.

## 2017-11-29 NOTE — ED Notes (Addendum)
Assisted phlebotomy with lab draw.

## 2017-11-29 NOTE — ED Notes (Signed)
Patient states that she does not want to eat. Food tray offered patient refused stating, "I do not want anything to eat."   Patient offered fluid, patient stated that she does not want anything to drink.

## 2017-11-30 DIAGNOSIS — F0390 Unspecified dementia without behavioral disturbance: Secondary | ICD-10-CM | POA: Diagnosis not present

## 2017-12-01 DIAGNOSIS — F330 Major depressive disorder, recurrent, mild: Secondary | ICD-10-CM | POA: Diagnosis not present

## 2017-12-01 DIAGNOSIS — F05 Delirium due to known physiological condition: Secondary | ICD-10-CM | POA: Diagnosis not present

## 2017-12-01 DIAGNOSIS — Z1329 Encounter for screening for other suspected endocrine disorder: Secondary | ICD-10-CM | POA: Diagnosis not present

## 2017-12-01 DIAGNOSIS — N390 Urinary tract infection, site not specified: Secondary | ICD-10-CM | POA: Diagnosis not present

## 2017-12-01 DIAGNOSIS — F0391 Unspecified dementia with behavioral disturbance: Secondary | ICD-10-CM | POA: Diagnosis not present

## 2017-12-02 LAB — URINE CULTURE: Culture: >=100000 — AB

## 2017-12-03 ENCOUNTER — Telehealth: Payer: Self-pay

## 2017-12-03 NOTE — Telephone Encounter (Signed)
Post ED Visit - Positive Culture Follow-up  Culture report reviewed by antimicrobial stewardship pharmacist:  []  Elenor Quinones, Pharm.D. []  Heide Guile, Pharm.D., BCPS AQ-ID [x]  Parks Neptune, Pharm.D., BCPS []  Alycia Rossetti, Pharm.D., BCPS []  Ridgely, Pharm.D., BCPS, AAHIVP []  Legrand Como, Pharm.D., BCPS, AAHIVP []  Salome Arnt, PharmD, BCPS []  Jalene Mullet, PharmD []  Vincenza Hews, PharmD, BCPS  Positive urine culture Treated with Cephalexin, organism sensitive to the same and no further patient follow-up is required at this time.  Genia Del 12/03/2017, 1:31 PM

## 2017-12-05 DIAGNOSIS — F0391 Unspecified dementia with behavioral disturbance: Secondary | ICD-10-CM | POA: Diagnosis not present

## 2017-12-05 DIAGNOSIS — F6089 Other specific personality disorders: Secondary | ICD-10-CM | POA: Diagnosis not present

## 2017-12-05 DIAGNOSIS — N390 Urinary tract infection, site not specified: Secondary | ICD-10-CM | POA: Diagnosis not present

## 2017-12-13 DIAGNOSIS — F0390 Unspecified dementia without behavioral disturbance: Secondary | ICD-10-CM | POA: Diagnosis not present

## 2017-12-15 DIAGNOSIS — F330 Major depressive disorder, recurrent, mild: Secondary | ICD-10-CM | POA: Diagnosis not present

## 2017-12-15 DIAGNOSIS — F0391 Unspecified dementia with behavioral disturbance: Secondary | ICD-10-CM | POA: Diagnosis not present

## 2017-12-15 DIAGNOSIS — F0390 Unspecified dementia without behavioral disturbance: Secondary | ICD-10-CM | POA: Diagnosis not present

## 2017-12-16 DIAGNOSIS — E039 Hypothyroidism, unspecified: Secondary | ICD-10-CM | POA: Diagnosis not present

## 2017-12-18 DIAGNOSIS — F0391 Unspecified dementia with behavioral disturbance: Secondary | ICD-10-CM | POA: Diagnosis not present

## 2017-12-18 DIAGNOSIS — F05 Delirium due to known physiological condition: Secondary | ICD-10-CM | POA: Diagnosis not present

## 2017-12-18 DIAGNOSIS — F330 Major depressive disorder, recurrent, mild: Secondary | ICD-10-CM | POA: Diagnosis not present

## 2017-12-28 ENCOUNTER — Ambulatory Visit (INDEPENDENT_AMBULATORY_CARE_PROVIDER_SITE_OTHER): Payer: Medicare Other | Admitting: Otolaryngology

## 2017-12-28 DIAGNOSIS — I10 Essential (primary) hypertension: Secondary | ICD-10-CM | POA: Diagnosis not present

## 2017-12-28 DIAGNOSIS — H900 Conductive hearing loss, bilateral: Secondary | ICD-10-CM | POA: Diagnosis not present

## 2017-12-28 DIAGNOSIS — M1990 Unspecified osteoarthritis, unspecified site: Secondary | ICD-10-CM | POA: Diagnosis not present

## 2017-12-28 DIAGNOSIS — H6123 Impacted cerumen, bilateral: Secondary | ICD-10-CM

## 2017-12-28 DIAGNOSIS — F0391 Unspecified dementia with behavioral disturbance: Secondary | ICD-10-CM | POA: Diagnosis not present

## 2017-12-28 DIAGNOSIS — F6089 Other specific personality disorders: Secondary | ICD-10-CM | POA: Diagnosis not present

## 2017-12-29 DIAGNOSIS — F0391 Unspecified dementia with behavioral disturbance: Secondary | ICD-10-CM | POA: Diagnosis not present

## 2018-01-05 DIAGNOSIS — F0391 Unspecified dementia with behavioral disturbance: Secondary | ICD-10-CM | POA: Diagnosis not present

## 2018-01-05 DIAGNOSIS — F330 Major depressive disorder, recurrent, mild: Secondary | ICD-10-CM | POA: Diagnosis not present

## 2018-01-23 DIAGNOSIS — F0391 Unspecified dementia with behavioral disturbance: Secondary | ICD-10-CM | POA: Diagnosis not present

## 2018-01-23 DIAGNOSIS — M1990 Unspecified osteoarthritis, unspecified site: Secondary | ICD-10-CM | POA: Diagnosis not present

## 2018-01-23 DIAGNOSIS — I10 Essential (primary) hypertension: Secondary | ICD-10-CM | POA: Diagnosis not present

## 2018-01-23 DIAGNOSIS — F6089 Other specific personality disorders: Secondary | ICD-10-CM | POA: Diagnosis not present

## 2018-02-01 DIAGNOSIS — F0391 Unspecified dementia with behavioral disturbance: Secondary | ICD-10-CM | POA: Diagnosis not present

## 2018-02-01 DIAGNOSIS — H5789 Other specified disorders of eye and adnexa: Secondary | ICD-10-CM | POA: Diagnosis not present

## 2018-02-01 DIAGNOSIS — F419 Anxiety disorder, unspecified: Secondary | ICD-10-CM | POA: Diagnosis not present

## 2018-02-02 DIAGNOSIS — F330 Major depressive disorder, recurrent, mild: Secondary | ICD-10-CM | POA: Diagnosis not present

## 2018-02-02 DIAGNOSIS — F0391 Unspecified dementia with behavioral disturbance: Secondary | ICD-10-CM | POA: Diagnosis not present

## 2018-02-05 DIAGNOSIS — F0391 Unspecified dementia with behavioral disturbance: Secondary | ICD-10-CM | POA: Diagnosis not present

## 2018-02-07 DIAGNOSIS — F6089 Other specific personality disorders: Secondary | ICD-10-CM | POA: Diagnosis not present

## 2018-02-07 DIAGNOSIS — F0391 Unspecified dementia with behavioral disturbance: Secondary | ICD-10-CM | POA: Diagnosis not present

## 2018-02-07 DIAGNOSIS — M1990 Unspecified osteoarthritis, unspecified site: Secondary | ICD-10-CM | POA: Diagnosis not present

## 2018-02-07 DIAGNOSIS — I10 Essential (primary) hypertension: Secondary | ICD-10-CM | POA: Diagnosis not present

## 2018-02-16 DIAGNOSIS — F0391 Unspecified dementia with behavioral disturbance: Secondary | ICD-10-CM | POA: Diagnosis not present

## 2018-02-16 DIAGNOSIS — F330 Major depressive disorder, recurrent, mild: Secondary | ICD-10-CM | POA: Diagnosis not present

## 2018-02-22 DIAGNOSIS — F0390 Unspecified dementia without behavioral disturbance: Secondary | ICD-10-CM | POA: Diagnosis not present

## 2018-03-13 DIAGNOSIS — M1990 Unspecified osteoarthritis, unspecified site: Secondary | ICD-10-CM | POA: Diagnosis not present

## 2018-03-13 DIAGNOSIS — F0391 Unspecified dementia with behavioral disturbance: Secondary | ICD-10-CM | POA: Diagnosis not present

## 2018-03-13 DIAGNOSIS — F6089 Other specific personality disorders: Secondary | ICD-10-CM | POA: Diagnosis not present

## 2018-03-13 DIAGNOSIS — I10 Essential (primary) hypertension: Secondary | ICD-10-CM | POA: Diagnosis not present

## 2018-03-16 DIAGNOSIS — F0151 Vascular dementia with behavioral disturbance: Secondary | ICD-10-CM | POA: Diagnosis not present

## 2018-03-16 DIAGNOSIS — F330 Major depressive disorder, recurrent, mild: Secondary | ICD-10-CM | POA: Diagnosis not present

## 2018-03-20 DIAGNOSIS — F6089 Other specific personality disorders: Secondary | ICD-10-CM | POA: Diagnosis not present

## 2018-03-20 DIAGNOSIS — F0391 Unspecified dementia with behavioral disturbance: Secondary | ICD-10-CM | POA: Diagnosis not present

## 2018-03-20 DIAGNOSIS — I10 Essential (primary) hypertension: Secondary | ICD-10-CM | POA: Diagnosis not present

## 2018-03-20 DIAGNOSIS — M1990 Unspecified osteoarthritis, unspecified site: Secondary | ICD-10-CM | POA: Diagnosis not present

## 2018-04-10 DIAGNOSIS — F329 Major depressive disorder, single episode, unspecified: Secondary | ICD-10-CM | POA: Diagnosis not present

## 2018-04-10 DIAGNOSIS — F0391 Unspecified dementia with behavioral disturbance: Secondary | ICD-10-CM | POA: Diagnosis not present

## 2018-04-10 DIAGNOSIS — M1990 Unspecified osteoarthritis, unspecified site: Secondary | ICD-10-CM | POA: Diagnosis not present

## 2018-04-10 DIAGNOSIS — I10 Essential (primary) hypertension: Secondary | ICD-10-CM | POA: Diagnosis not present

## 2018-04-20 DIAGNOSIS — F330 Major depressive disorder, recurrent, mild: Secondary | ICD-10-CM | POA: Diagnosis not present

## 2018-04-20 DIAGNOSIS — F0151 Vascular dementia with behavioral disturbance: Secondary | ICD-10-CM | POA: Diagnosis not present

## 2018-05-15 DIAGNOSIS — F0151 Vascular dementia with behavioral disturbance: Secondary | ICD-10-CM | POA: Diagnosis not present

## 2018-05-15 DIAGNOSIS — F330 Major depressive disorder, recurrent, mild: Secondary | ICD-10-CM | POA: Diagnosis not present

## 2018-05-16 DIAGNOSIS — E785 Hyperlipidemia, unspecified: Secondary | ICD-10-CM | POA: Diagnosis not present

## 2018-05-16 DIAGNOSIS — E440 Moderate protein-calorie malnutrition: Secondary | ICD-10-CM | POA: Diagnosis not present

## 2018-05-16 DIAGNOSIS — F05 Delirium due to known physiological condition: Secondary | ICD-10-CM | POA: Diagnosis not present

## 2018-05-16 DIAGNOSIS — I10 Essential (primary) hypertension: Secondary | ICD-10-CM | POA: Diagnosis not present

## 2018-05-23 DIAGNOSIS — M24562 Contracture, left knee: Secondary | ICD-10-CM | POA: Diagnosis not present

## 2018-05-23 DIAGNOSIS — F0390 Unspecified dementia without behavioral disturbance: Secondary | ICD-10-CM | POA: Diagnosis not present

## 2018-05-23 DIAGNOSIS — R41841 Cognitive communication deficit: Secondary | ICD-10-CM | POA: Diagnosis not present

## 2018-05-23 DIAGNOSIS — M6281 Muscle weakness (generalized): Secondary | ICD-10-CM | POA: Diagnosis not present

## 2018-05-23 DIAGNOSIS — R293 Abnormal posture: Secondary | ICD-10-CM | POA: Diagnosis not present

## 2018-05-24 DIAGNOSIS — M24562 Contracture, left knee: Secondary | ICD-10-CM | POA: Diagnosis not present

## 2018-05-24 DIAGNOSIS — R293 Abnormal posture: Secondary | ICD-10-CM | POA: Diagnosis not present

## 2018-05-24 DIAGNOSIS — M6281 Muscle weakness (generalized): Secondary | ICD-10-CM | POA: Diagnosis not present

## 2018-05-24 DIAGNOSIS — F0390 Unspecified dementia without behavioral disturbance: Secondary | ICD-10-CM | POA: Diagnosis not present

## 2018-05-24 DIAGNOSIS — R41841 Cognitive communication deficit: Secondary | ICD-10-CM | POA: Diagnosis not present

## 2018-05-25 DIAGNOSIS — R41841 Cognitive communication deficit: Secondary | ICD-10-CM | POA: Diagnosis not present

## 2018-05-25 DIAGNOSIS — F0390 Unspecified dementia without behavioral disturbance: Secondary | ICD-10-CM | POA: Diagnosis not present

## 2018-05-25 DIAGNOSIS — M24562 Contracture, left knee: Secondary | ICD-10-CM | POA: Diagnosis not present

## 2018-05-25 DIAGNOSIS — M6281 Muscle weakness (generalized): Secondary | ICD-10-CM | POA: Diagnosis not present

## 2018-05-25 DIAGNOSIS — R293 Abnormal posture: Secondary | ICD-10-CM | POA: Diagnosis not present

## 2018-05-26 DIAGNOSIS — F0390 Unspecified dementia without behavioral disturbance: Secondary | ICD-10-CM | POA: Diagnosis not present

## 2018-05-26 DIAGNOSIS — M24562 Contracture, left knee: Secondary | ICD-10-CM | POA: Diagnosis not present

## 2018-05-26 DIAGNOSIS — R293 Abnormal posture: Secondary | ICD-10-CM | POA: Diagnosis not present

## 2018-05-26 DIAGNOSIS — R41841 Cognitive communication deficit: Secondary | ICD-10-CM | POA: Diagnosis not present

## 2018-05-26 DIAGNOSIS — M6281 Muscle weakness (generalized): Secondary | ICD-10-CM | POA: Diagnosis not present

## 2018-05-28 DIAGNOSIS — R293 Abnormal posture: Secondary | ICD-10-CM | POA: Diagnosis not present

## 2018-05-28 DIAGNOSIS — F0390 Unspecified dementia without behavioral disturbance: Secondary | ICD-10-CM | POA: Diagnosis not present

## 2018-05-28 DIAGNOSIS — M24562 Contracture, left knee: Secondary | ICD-10-CM | POA: Diagnosis not present

## 2018-05-28 DIAGNOSIS — R41841 Cognitive communication deficit: Secondary | ICD-10-CM | POA: Diagnosis not present

## 2018-05-28 DIAGNOSIS — M6281 Muscle weakness (generalized): Secondary | ICD-10-CM | POA: Diagnosis not present

## 2018-05-29 DIAGNOSIS — M6281 Muscle weakness (generalized): Secondary | ICD-10-CM | POA: Diagnosis not present

## 2018-05-29 DIAGNOSIS — M24562 Contracture, left knee: Secondary | ICD-10-CM | POA: Diagnosis not present

## 2018-05-29 DIAGNOSIS — R293 Abnormal posture: Secondary | ICD-10-CM | POA: Diagnosis not present

## 2018-05-29 DIAGNOSIS — R41841 Cognitive communication deficit: Secondary | ICD-10-CM | POA: Diagnosis not present

## 2018-05-29 DIAGNOSIS — F0390 Unspecified dementia without behavioral disturbance: Secondary | ICD-10-CM | POA: Diagnosis not present

## 2018-05-30 DIAGNOSIS — F0390 Unspecified dementia without behavioral disturbance: Secondary | ICD-10-CM | POA: Diagnosis not present

## 2018-05-30 DIAGNOSIS — R293 Abnormal posture: Secondary | ICD-10-CM | POA: Diagnosis not present

## 2018-05-30 DIAGNOSIS — M6281 Muscle weakness (generalized): Secondary | ICD-10-CM | POA: Diagnosis not present

## 2018-05-30 DIAGNOSIS — M24562 Contracture, left knee: Secondary | ICD-10-CM | POA: Diagnosis not present

## 2018-05-30 DIAGNOSIS — R41841 Cognitive communication deficit: Secondary | ICD-10-CM | POA: Diagnosis not present

## 2018-05-31 DIAGNOSIS — M24562 Contracture, left knee: Secondary | ICD-10-CM | POA: Diagnosis not present

## 2018-05-31 DIAGNOSIS — R293 Abnormal posture: Secondary | ICD-10-CM | POA: Diagnosis not present

## 2018-05-31 DIAGNOSIS — R41841 Cognitive communication deficit: Secondary | ICD-10-CM | POA: Diagnosis not present

## 2018-05-31 DIAGNOSIS — M6281 Muscle weakness (generalized): Secondary | ICD-10-CM | POA: Diagnosis not present

## 2018-05-31 DIAGNOSIS — F0390 Unspecified dementia without behavioral disturbance: Secondary | ICD-10-CM | POA: Diagnosis not present

## 2018-06-01 DIAGNOSIS — M24562 Contracture, left knee: Secondary | ICD-10-CM | POA: Diagnosis not present

## 2018-06-01 DIAGNOSIS — M6281 Muscle weakness (generalized): Secondary | ICD-10-CM | POA: Diagnosis not present

## 2018-06-01 DIAGNOSIS — R41841 Cognitive communication deficit: Secondary | ICD-10-CM | POA: Diagnosis not present

## 2018-06-01 DIAGNOSIS — F0390 Unspecified dementia without behavioral disturbance: Secondary | ICD-10-CM | POA: Diagnosis not present

## 2018-06-01 DIAGNOSIS — R293 Abnormal posture: Secondary | ICD-10-CM | POA: Diagnosis not present

## 2018-06-04 DIAGNOSIS — R41841 Cognitive communication deficit: Secondary | ICD-10-CM | POA: Diagnosis not present

## 2018-06-04 DIAGNOSIS — M6281 Muscle weakness (generalized): Secondary | ICD-10-CM | POA: Diagnosis not present

## 2018-06-04 DIAGNOSIS — R293 Abnormal posture: Secondary | ICD-10-CM | POA: Diagnosis not present

## 2018-06-04 DIAGNOSIS — M24562 Contracture, left knee: Secondary | ICD-10-CM | POA: Diagnosis not present

## 2018-06-04 DIAGNOSIS — F0390 Unspecified dementia without behavioral disturbance: Secondary | ICD-10-CM | POA: Diagnosis not present

## 2018-06-05 DIAGNOSIS — R293 Abnormal posture: Secondary | ICD-10-CM | POA: Diagnosis not present

## 2018-06-05 DIAGNOSIS — R41841 Cognitive communication deficit: Secondary | ICD-10-CM | POA: Diagnosis not present

## 2018-06-05 DIAGNOSIS — F0390 Unspecified dementia without behavioral disturbance: Secondary | ICD-10-CM | POA: Diagnosis not present

## 2018-06-05 DIAGNOSIS — M24562 Contracture, left knee: Secondary | ICD-10-CM | POA: Diagnosis not present

## 2018-06-05 DIAGNOSIS — M6281 Muscle weakness (generalized): Secondary | ICD-10-CM | POA: Diagnosis not present

## 2018-06-06 DIAGNOSIS — M24562 Contracture, left knee: Secondary | ICD-10-CM | POA: Diagnosis not present

## 2018-06-06 DIAGNOSIS — R41841 Cognitive communication deficit: Secondary | ICD-10-CM | POA: Diagnosis not present

## 2018-06-06 DIAGNOSIS — R293 Abnormal posture: Secondary | ICD-10-CM | POA: Diagnosis not present

## 2018-06-06 DIAGNOSIS — F0390 Unspecified dementia without behavioral disturbance: Secondary | ICD-10-CM | POA: Diagnosis not present

## 2018-06-06 DIAGNOSIS — M6281 Muscle weakness (generalized): Secondary | ICD-10-CM | POA: Diagnosis not present

## 2018-06-07 DIAGNOSIS — F0390 Unspecified dementia without behavioral disturbance: Secondary | ICD-10-CM | POA: Diagnosis not present

## 2018-06-07 DIAGNOSIS — R41841 Cognitive communication deficit: Secondary | ICD-10-CM | POA: Diagnosis not present

## 2018-06-07 DIAGNOSIS — M6281 Muscle weakness (generalized): Secondary | ICD-10-CM | POA: Diagnosis not present

## 2018-06-07 DIAGNOSIS — R293 Abnormal posture: Secondary | ICD-10-CM | POA: Diagnosis not present

## 2018-06-07 DIAGNOSIS — M24562 Contracture, left knee: Secondary | ICD-10-CM | POA: Diagnosis not present

## 2018-06-08 DIAGNOSIS — R293 Abnormal posture: Secondary | ICD-10-CM | POA: Diagnosis not present

## 2018-06-08 DIAGNOSIS — R41841 Cognitive communication deficit: Secondary | ICD-10-CM | POA: Diagnosis not present

## 2018-06-08 DIAGNOSIS — F0390 Unspecified dementia without behavioral disturbance: Secondary | ICD-10-CM | POA: Diagnosis not present

## 2018-06-08 DIAGNOSIS — M24562 Contracture, left knee: Secondary | ICD-10-CM | POA: Diagnosis not present

## 2018-06-08 DIAGNOSIS — M6281 Muscle weakness (generalized): Secondary | ICD-10-CM | POA: Diagnosis not present

## 2018-06-11 DIAGNOSIS — R293 Abnormal posture: Secondary | ICD-10-CM | POA: Diagnosis not present

## 2018-06-11 DIAGNOSIS — M24562 Contracture, left knee: Secondary | ICD-10-CM | POA: Diagnosis not present

## 2018-06-11 DIAGNOSIS — R41841 Cognitive communication deficit: Secondary | ICD-10-CM | POA: Diagnosis not present

## 2018-06-11 DIAGNOSIS — M6281 Muscle weakness (generalized): Secondary | ICD-10-CM | POA: Diagnosis not present

## 2018-06-11 DIAGNOSIS — F0390 Unspecified dementia without behavioral disturbance: Secondary | ICD-10-CM | POA: Diagnosis not present

## 2018-06-12 DIAGNOSIS — R293 Abnormal posture: Secondary | ICD-10-CM | POA: Diagnosis not present

## 2018-06-12 DIAGNOSIS — M24562 Contracture, left knee: Secondary | ICD-10-CM | POA: Diagnosis not present

## 2018-06-12 DIAGNOSIS — M6281 Muscle weakness (generalized): Secondary | ICD-10-CM | POA: Diagnosis not present

## 2018-06-12 DIAGNOSIS — R41841 Cognitive communication deficit: Secondary | ICD-10-CM | POA: Diagnosis not present

## 2018-06-12 DIAGNOSIS — F0390 Unspecified dementia without behavioral disturbance: Secondary | ICD-10-CM | POA: Diagnosis not present

## 2018-06-13 DIAGNOSIS — M24562 Contracture, left knee: Secondary | ICD-10-CM | POA: Diagnosis not present

## 2018-06-13 DIAGNOSIS — R293 Abnormal posture: Secondary | ICD-10-CM | POA: Diagnosis not present

## 2018-06-13 DIAGNOSIS — F0390 Unspecified dementia without behavioral disturbance: Secondary | ICD-10-CM | POA: Diagnosis not present

## 2018-06-13 DIAGNOSIS — R41841 Cognitive communication deficit: Secondary | ICD-10-CM | POA: Diagnosis not present

## 2018-06-13 DIAGNOSIS — M6281 Muscle weakness (generalized): Secondary | ICD-10-CM | POA: Diagnosis not present

## 2018-06-14 DIAGNOSIS — R293 Abnormal posture: Secondary | ICD-10-CM | POA: Diagnosis not present

## 2018-06-14 DIAGNOSIS — F0390 Unspecified dementia without behavioral disturbance: Secondary | ICD-10-CM | POA: Diagnosis not present

## 2018-06-14 DIAGNOSIS — M24562 Contracture, left knee: Secondary | ICD-10-CM | POA: Diagnosis not present

## 2018-06-14 DIAGNOSIS — F0391 Unspecified dementia with behavioral disturbance: Secondary | ICD-10-CM | POA: Diagnosis not present

## 2018-06-14 DIAGNOSIS — M1990 Unspecified osteoarthritis, unspecified site: Secondary | ICD-10-CM | POA: Diagnosis not present

## 2018-06-14 DIAGNOSIS — M6281 Muscle weakness (generalized): Secondary | ICD-10-CM | POA: Diagnosis not present

## 2018-06-14 DIAGNOSIS — I10 Essential (primary) hypertension: Secondary | ICD-10-CM | POA: Diagnosis not present

## 2018-06-14 DIAGNOSIS — R41841 Cognitive communication deficit: Secondary | ICD-10-CM | POA: Diagnosis not present

## 2018-06-14 DIAGNOSIS — E440 Moderate protein-calorie malnutrition: Secondary | ICD-10-CM | POA: Diagnosis not present

## 2018-06-15 DIAGNOSIS — F0390 Unspecified dementia without behavioral disturbance: Secondary | ICD-10-CM | POA: Diagnosis not present

## 2018-06-15 DIAGNOSIS — F330 Major depressive disorder, recurrent, mild: Secondary | ICD-10-CM | POA: Diagnosis not present

## 2018-06-15 DIAGNOSIS — F0151 Vascular dementia with behavioral disturbance: Secondary | ICD-10-CM | POA: Diagnosis not present

## 2018-06-15 DIAGNOSIS — M24562 Contracture, left knee: Secondary | ICD-10-CM | POA: Diagnosis not present

## 2018-06-15 DIAGNOSIS — M6281 Muscle weakness (generalized): Secondary | ICD-10-CM | POA: Diagnosis not present

## 2018-06-15 DIAGNOSIS — R41841 Cognitive communication deficit: Secondary | ICD-10-CM | POA: Diagnosis not present

## 2018-06-15 DIAGNOSIS — F062 Psychotic disorder with delusions due to known physiological condition: Secondary | ICD-10-CM | POA: Diagnosis not present

## 2018-06-15 DIAGNOSIS — R293 Abnormal posture: Secondary | ICD-10-CM | POA: Diagnosis not present

## 2018-06-25 DIAGNOSIS — R2242 Localized swelling, mass and lump, left lower limb: Secondary | ICD-10-CM | POA: Diagnosis not present

## 2018-06-27 DIAGNOSIS — K5900 Constipation, unspecified: Secondary | ICD-10-CM | POA: Diagnosis not present

## 2018-06-27 DIAGNOSIS — I10 Essential (primary) hypertension: Secondary | ICD-10-CM | POA: Diagnosis not present

## 2018-06-27 DIAGNOSIS — F329 Major depressive disorder, single episode, unspecified: Secondary | ICD-10-CM | POA: Diagnosis not present

## 2018-06-27 DIAGNOSIS — E785 Hyperlipidemia, unspecified: Secondary | ICD-10-CM | POA: Diagnosis not present

## 2018-06-29 DIAGNOSIS — F062 Psychotic disorder with delusions due to known physiological condition: Secondary | ICD-10-CM | POA: Diagnosis not present

## 2018-06-29 DIAGNOSIS — F0151 Vascular dementia with behavioral disturbance: Secondary | ICD-10-CM | POA: Diagnosis not present

## 2018-06-29 DIAGNOSIS — F330 Major depressive disorder, recurrent, mild: Secondary | ICD-10-CM | POA: Diagnosis not present

## 2018-07-04 DIAGNOSIS — I739 Peripheral vascular disease, unspecified: Secondary | ICD-10-CM | POA: Diagnosis not present

## 2018-07-04 DIAGNOSIS — L603 Nail dystrophy: Secondary | ICD-10-CM | POA: Diagnosis not present

## 2018-07-04 DIAGNOSIS — B351 Tinea unguium: Secondary | ICD-10-CM | POA: Diagnosis not present

## 2018-07-13 DIAGNOSIS — F0151 Vascular dementia with behavioral disturbance: Secondary | ICD-10-CM | POA: Diagnosis not present

## 2018-07-13 DIAGNOSIS — F062 Psychotic disorder with delusions due to known physiological condition: Secondary | ICD-10-CM | POA: Diagnosis not present

## 2018-07-13 DIAGNOSIS — F330 Major depressive disorder, recurrent, mild: Secondary | ICD-10-CM | POA: Diagnosis not present

## 2018-07-24 DIAGNOSIS — I10 Essential (primary) hypertension: Secondary | ICD-10-CM | POA: Diagnosis not present

## 2018-07-24 DIAGNOSIS — F22 Delusional disorders: Secondary | ICD-10-CM | POA: Diagnosis not present

## 2018-07-24 DIAGNOSIS — F0391 Unspecified dementia with behavioral disturbance: Secondary | ICD-10-CM | POA: Diagnosis not present

## 2018-07-24 DIAGNOSIS — R634 Abnormal weight loss: Secondary | ICD-10-CM | POA: Diagnosis not present

## 2018-08-01 DIAGNOSIS — F0391 Unspecified dementia with behavioral disturbance: Secondary | ICD-10-CM | POA: Diagnosis not present

## 2018-08-01 DIAGNOSIS — M6281 Muscle weakness (generalized): Secondary | ICD-10-CM | POA: Diagnosis not present

## 2018-08-03 DIAGNOSIS — Z23 Encounter for immunization: Secondary | ICD-10-CM | POA: Diagnosis not present

## 2018-08-07 DIAGNOSIS — R41841 Cognitive communication deficit: Secondary | ICD-10-CM | POA: Diagnosis not present

## 2018-08-07 DIAGNOSIS — R296 Repeated falls: Secondary | ICD-10-CM | POA: Diagnosis not present

## 2018-08-09 DIAGNOSIS — R296 Repeated falls: Secondary | ICD-10-CM | POA: Diagnosis not present

## 2018-08-09 DIAGNOSIS — R41841 Cognitive communication deficit: Secondary | ICD-10-CM | POA: Diagnosis not present

## 2018-08-10 DIAGNOSIS — R41841 Cognitive communication deficit: Secondary | ICD-10-CM | POA: Diagnosis not present

## 2018-08-10 DIAGNOSIS — F330 Major depressive disorder, recurrent, mild: Secondary | ICD-10-CM | POA: Diagnosis not present

## 2018-08-10 DIAGNOSIS — F062 Psychotic disorder with delusions due to known physiological condition: Secondary | ICD-10-CM | POA: Diagnosis not present

## 2018-08-10 DIAGNOSIS — R296 Repeated falls: Secondary | ICD-10-CM | POA: Diagnosis not present

## 2018-08-10 DIAGNOSIS — F0151 Vascular dementia with behavioral disturbance: Secondary | ICD-10-CM | POA: Diagnosis not present

## 2018-08-11 DIAGNOSIS — R41841 Cognitive communication deficit: Secondary | ICD-10-CM | POA: Diagnosis not present

## 2018-08-11 DIAGNOSIS — R296 Repeated falls: Secondary | ICD-10-CM | POA: Diagnosis not present

## 2018-08-12 DIAGNOSIS — R41841 Cognitive communication deficit: Secondary | ICD-10-CM | POA: Diagnosis not present

## 2018-08-12 DIAGNOSIS — R296 Repeated falls: Secondary | ICD-10-CM | POA: Diagnosis not present

## 2018-08-14 DIAGNOSIS — R41841 Cognitive communication deficit: Secondary | ICD-10-CM | POA: Diagnosis not present

## 2018-08-14 DIAGNOSIS — R296 Repeated falls: Secondary | ICD-10-CM | POA: Diagnosis not present

## 2018-08-15 DIAGNOSIS — R296 Repeated falls: Secondary | ICD-10-CM | POA: Diagnosis not present

## 2018-08-15 DIAGNOSIS — R41841 Cognitive communication deficit: Secondary | ICD-10-CM | POA: Diagnosis not present

## 2018-08-16 DIAGNOSIS — R41841 Cognitive communication deficit: Secondary | ICD-10-CM | POA: Diagnosis not present

## 2018-08-16 DIAGNOSIS — R296 Repeated falls: Secondary | ICD-10-CM | POA: Diagnosis not present

## 2018-08-17 DIAGNOSIS — F22 Delusional disorders: Secondary | ICD-10-CM | POA: Diagnosis not present

## 2018-08-17 DIAGNOSIS — M24561 Contracture, right knee: Secondary | ICD-10-CM | POA: Diagnosis not present

## 2018-08-17 DIAGNOSIS — R296 Repeated falls: Secondary | ICD-10-CM | POA: Diagnosis not present

## 2018-08-17 DIAGNOSIS — K5900 Constipation, unspecified: Secondary | ICD-10-CM | POA: Diagnosis not present

## 2018-08-17 DIAGNOSIS — R634 Abnormal weight loss: Secondary | ICD-10-CM | POA: Diagnosis not present

## 2018-08-19 DIAGNOSIS — R296 Repeated falls: Secondary | ICD-10-CM | POA: Diagnosis not present

## 2018-08-21 DIAGNOSIS — R296 Repeated falls: Secondary | ICD-10-CM | POA: Diagnosis not present

## 2018-08-22 DIAGNOSIS — R296 Repeated falls: Secondary | ICD-10-CM | POA: Diagnosis not present

## 2018-08-23 DIAGNOSIS — E785 Hyperlipidemia, unspecified: Secondary | ICD-10-CM | POA: Diagnosis not present

## 2018-08-23 DIAGNOSIS — K5900 Constipation, unspecified: Secondary | ICD-10-CM | POA: Diagnosis not present

## 2018-08-23 DIAGNOSIS — F329 Major depressive disorder, single episode, unspecified: Secondary | ICD-10-CM | POA: Diagnosis not present

## 2018-08-23 DIAGNOSIS — B351 Tinea unguium: Secondary | ICD-10-CM | POA: Diagnosis not present

## 2018-08-23 DIAGNOSIS — I10 Essential (primary) hypertension: Secondary | ICD-10-CM | POA: Diagnosis not present

## 2018-08-23 DIAGNOSIS — I739 Peripheral vascular disease, unspecified: Secondary | ICD-10-CM | POA: Diagnosis not present

## 2018-08-24 DIAGNOSIS — R296 Repeated falls: Secondary | ICD-10-CM | POA: Diagnosis not present

## 2018-08-25 DIAGNOSIS — R269 Unspecified abnormalities of gait and mobility: Secondary | ICD-10-CM | POA: Diagnosis not present

## 2018-08-25 DIAGNOSIS — R296 Repeated falls: Secondary | ICD-10-CM | POA: Diagnosis not present

## 2018-08-25 DIAGNOSIS — R1310 Dysphagia, unspecified: Secondary | ICD-10-CM | POA: Diagnosis not present

## 2018-08-25 DIAGNOSIS — R634 Abnormal weight loss: Secondary | ICD-10-CM | POA: Diagnosis not present

## 2018-08-25 DIAGNOSIS — E440 Moderate protein-calorie malnutrition: Secondary | ICD-10-CM | POA: Diagnosis not present

## 2018-08-27 DIAGNOSIS — R296 Repeated falls: Secondary | ICD-10-CM | POA: Diagnosis not present

## 2018-08-28 DIAGNOSIS — R296 Repeated falls: Secondary | ICD-10-CM | POA: Diagnosis not present

## 2018-08-29 DIAGNOSIS — R296 Repeated falls: Secondary | ICD-10-CM | POA: Diagnosis not present

## 2018-08-30 DIAGNOSIS — R296 Repeated falls: Secondary | ICD-10-CM | POA: Diagnosis not present

## 2018-09-01 DIAGNOSIS — R296 Repeated falls: Secondary | ICD-10-CM | POA: Diagnosis not present

## 2018-09-03 DIAGNOSIS — R296 Repeated falls: Secondary | ICD-10-CM | POA: Diagnosis not present

## 2018-09-04 DIAGNOSIS — R296 Repeated falls: Secondary | ICD-10-CM | POA: Diagnosis not present

## 2018-09-05 DIAGNOSIS — R296 Repeated falls: Secondary | ICD-10-CM | POA: Diagnosis not present

## 2018-09-06 DIAGNOSIS — F330 Major depressive disorder, recurrent, mild: Secondary | ICD-10-CM | POA: Diagnosis not present

## 2018-09-06 DIAGNOSIS — R296 Repeated falls: Secondary | ICD-10-CM | POA: Diagnosis not present

## 2018-09-06 DIAGNOSIS — F062 Psychotic disorder with delusions due to known physiological condition: Secondary | ICD-10-CM | POA: Diagnosis not present

## 2018-09-06 DIAGNOSIS — F0151 Vascular dementia with behavioral disturbance: Secondary | ICD-10-CM | POA: Diagnosis not present

## 2018-09-07 DIAGNOSIS — R296 Repeated falls: Secondary | ICD-10-CM | POA: Diagnosis not present

## 2018-09-09 DIAGNOSIS — R296 Repeated falls: Secondary | ICD-10-CM | POA: Diagnosis not present

## 2018-09-10 DIAGNOSIS — R296 Repeated falls: Secondary | ICD-10-CM | POA: Diagnosis not present

## 2018-09-11 DIAGNOSIS — R296 Repeated falls: Secondary | ICD-10-CM | POA: Diagnosis not present

## 2018-09-12 DIAGNOSIS — K5900 Constipation, unspecified: Secondary | ICD-10-CM | POA: Diagnosis not present

## 2018-09-12 DIAGNOSIS — R269 Unspecified abnormalities of gait and mobility: Secondary | ICD-10-CM | POA: Diagnosis not present

## 2018-09-12 DIAGNOSIS — R296 Repeated falls: Secondary | ICD-10-CM | POA: Diagnosis not present

## 2018-09-12 DIAGNOSIS — E440 Moderate protein-calorie malnutrition: Secondary | ICD-10-CM | POA: Diagnosis not present

## 2018-09-12 DIAGNOSIS — F0391 Unspecified dementia with behavioral disturbance: Secondary | ICD-10-CM | POA: Diagnosis not present

## 2018-09-14 DIAGNOSIS — R296 Repeated falls: Secondary | ICD-10-CM | POA: Diagnosis not present

## 2018-09-16 DIAGNOSIS — R296 Repeated falls: Secondary | ICD-10-CM | POA: Diagnosis not present

## 2018-09-17 DIAGNOSIS — R296 Repeated falls: Secondary | ICD-10-CM | POA: Diagnosis not present

## 2018-09-18 DIAGNOSIS — R296 Repeated falls: Secondary | ICD-10-CM | POA: Diagnosis not present

## 2018-09-19 DIAGNOSIS — R296 Repeated falls: Secondary | ICD-10-CM | POA: Diagnosis not present

## 2018-09-20 DIAGNOSIS — R296 Repeated falls: Secondary | ICD-10-CM | POA: Diagnosis not present

## 2018-10-04 DIAGNOSIS — F330 Major depressive disorder, recurrent, mild: Secondary | ICD-10-CM | POA: Diagnosis not present

## 2018-10-04 DIAGNOSIS — F062 Psychotic disorder with delusions due to known physiological condition: Secondary | ICD-10-CM | POA: Diagnosis not present

## 2018-10-04 DIAGNOSIS — F0151 Vascular dementia with behavioral disturbance: Secondary | ICD-10-CM | POA: Diagnosis not present

## 2018-10-04 DIAGNOSIS — F418 Other specified anxiety disorders: Secondary | ICD-10-CM | POA: Diagnosis not present

## 2018-10-09 DIAGNOSIS — F22 Delusional disorders: Secondary | ICD-10-CM | POA: Diagnosis not present

## 2018-10-09 DIAGNOSIS — K5900 Constipation, unspecified: Secondary | ICD-10-CM | POA: Diagnosis not present

## 2018-10-09 DIAGNOSIS — R634 Abnormal weight loss: Secondary | ICD-10-CM | POA: Diagnosis not present

## 2018-10-09 DIAGNOSIS — F0391 Unspecified dementia with behavioral disturbance: Secondary | ICD-10-CM | POA: Diagnosis not present

## 2018-10-31 DIAGNOSIS — F0390 Unspecified dementia without behavioral disturbance: Secondary | ICD-10-CM | POA: Diagnosis not present

## 2018-11-01 DIAGNOSIS — F330 Major depressive disorder, recurrent, mild: Secondary | ICD-10-CM | POA: Diagnosis not present

## 2018-11-01 DIAGNOSIS — F062 Psychotic disorder with delusions due to known physiological condition: Secondary | ICD-10-CM | POA: Diagnosis not present

## 2018-11-01 DIAGNOSIS — F418 Other specified anxiety disorders: Secondary | ICD-10-CM | POA: Diagnosis not present

## 2018-11-01 DIAGNOSIS — F0151 Vascular dementia with behavioral disturbance: Secondary | ICD-10-CM | POA: Diagnosis not present

## 2018-11-16 DIAGNOSIS — K5900 Constipation, unspecified: Secondary | ICD-10-CM | POA: Diagnosis not present

## 2018-11-16 DIAGNOSIS — E785 Hyperlipidemia, unspecified: Secondary | ICD-10-CM | POA: Diagnosis not present

## 2018-11-16 DIAGNOSIS — F22 Delusional disorders: Secondary | ICD-10-CM | POA: Diagnosis not present

## 2018-11-16 DIAGNOSIS — F329 Major depressive disorder, single episode, unspecified: Secondary | ICD-10-CM | POA: Diagnosis not present

## 2018-11-29 DIAGNOSIS — F062 Psychotic disorder with delusions due to known physiological condition: Secondary | ICD-10-CM | POA: Diagnosis not present

## 2018-11-29 DIAGNOSIS — F0151 Vascular dementia with behavioral disturbance: Secondary | ICD-10-CM | POA: Diagnosis not present

## 2018-11-29 DIAGNOSIS — F330 Major depressive disorder, recurrent, mild: Secondary | ICD-10-CM | POA: Diagnosis not present

## 2018-11-29 DIAGNOSIS — F418 Other specified anxiety disorders: Secondary | ICD-10-CM | POA: Diagnosis not present

## 2018-12-01 DIAGNOSIS — R41841 Cognitive communication deficit: Secondary | ICD-10-CM | POA: Diagnosis not present

## 2018-12-01 DIAGNOSIS — R634 Abnormal weight loss: Secondary | ICD-10-CM | POA: Diagnosis not present

## 2018-12-01 DIAGNOSIS — K5900 Constipation, unspecified: Secondary | ICD-10-CM | POA: Diagnosis not present

## 2018-12-01 DIAGNOSIS — R1310 Dysphagia, unspecified: Secondary | ICD-10-CM | POA: Diagnosis not present

## 2018-12-11 DIAGNOSIS — E782 Mixed hyperlipidemia: Secondary | ICD-10-CM | POA: Diagnosis not present

## 2018-12-11 DIAGNOSIS — D649 Anemia, unspecified: Secondary | ICD-10-CM | POA: Diagnosis not present

## 2018-12-11 DIAGNOSIS — Z79899 Other long term (current) drug therapy: Secondary | ICD-10-CM | POA: Diagnosis not present

## 2018-12-27 ENCOUNTER — Ambulatory Visit (INDEPENDENT_AMBULATORY_CARE_PROVIDER_SITE_OTHER): Payer: Medicare Other | Admitting: Otolaryngology

## 2018-12-27 DIAGNOSIS — F062 Psychotic disorder with delusions due to known physiological condition: Secondary | ICD-10-CM | POA: Diagnosis not present

## 2018-12-27 DIAGNOSIS — F418 Other specified anxiety disorders: Secondary | ICD-10-CM | POA: Diagnosis not present

## 2018-12-27 DIAGNOSIS — F0151 Vascular dementia with behavioral disturbance: Secondary | ICD-10-CM | POA: Diagnosis not present

## 2018-12-27 DIAGNOSIS — F330 Major depressive disorder, recurrent, mild: Secondary | ICD-10-CM | POA: Diagnosis not present

## 2019-01-09 DIAGNOSIS — F0151 Vascular dementia with behavioral disturbance: Secondary | ICD-10-CM | POA: Diagnosis not present

## 2019-01-09 DIAGNOSIS — F062 Psychotic disorder with delusions due to known physiological condition: Secondary | ICD-10-CM | POA: Diagnosis not present

## 2019-01-09 DIAGNOSIS — F330 Major depressive disorder, recurrent, mild: Secondary | ICD-10-CM | POA: Diagnosis not present

## 2019-01-09 DIAGNOSIS — F418 Other specified anxiety disorders: Secondary | ICD-10-CM | POA: Diagnosis not present

## 2019-01-13 DIAGNOSIS — F0151 Vascular dementia with behavioral disturbance: Secondary | ICD-10-CM | POA: Diagnosis not present

## 2019-01-15 DIAGNOSIS — F329 Major depressive disorder, single episode, unspecified: Secondary | ICD-10-CM | POA: Diagnosis not present

## 2019-01-15 DIAGNOSIS — F22 Delusional disorders: Secondary | ICD-10-CM | POA: Diagnosis not present

## 2019-01-15 DIAGNOSIS — E785 Hyperlipidemia, unspecified: Secondary | ICD-10-CM | POA: Diagnosis not present

## 2019-01-27 DIAGNOSIS — E440 Moderate protein-calorie malnutrition: Secondary | ICD-10-CM | POA: Diagnosis not present

## 2019-01-27 DIAGNOSIS — R238 Other skin changes: Secondary | ICD-10-CM | POA: Diagnosis not present

## 2019-01-27 DIAGNOSIS — K5900 Constipation, unspecified: Secondary | ICD-10-CM | POA: Diagnosis not present

## 2019-01-27 DIAGNOSIS — I10 Essential (primary) hypertension: Secondary | ICD-10-CM | POA: Diagnosis not present

## 2019-02-01 DIAGNOSIS — M24562 Contracture, left knee: Secondary | ICD-10-CM | POA: Diagnosis not present

## 2019-02-01 DIAGNOSIS — M24561 Contracture, right knee: Secondary | ICD-10-CM | POA: Diagnosis not present

## 2019-02-01 DIAGNOSIS — F0151 Vascular dementia with behavioral disturbance: Secondary | ICD-10-CM | POA: Diagnosis not present

## 2019-02-04 DIAGNOSIS — F0151 Vascular dementia with behavioral disturbance: Secondary | ICD-10-CM | POA: Diagnosis not present

## 2019-02-04 DIAGNOSIS — M24561 Contracture, right knee: Secondary | ICD-10-CM | POA: Diagnosis not present

## 2019-02-04 DIAGNOSIS — M24562 Contracture, left knee: Secondary | ICD-10-CM | POA: Diagnosis not present

## 2019-02-05 DIAGNOSIS — B351 Tinea unguium: Secondary | ICD-10-CM | POA: Diagnosis not present

## 2019-02-05 DIAGNOSIS — I739 Peripheral vascular disease, unspecified: Secondary | ICD-10-CM | POA: Diagnosis not present

## 2019-02-05 DIAGNOSIS — M79675 Pain in left toe(s): Secondary | ICD-10-CM | POA: Diagnosis not present

## 2019-02-05 DIAGNOSIS — M79674 Pain in right toe(s): Secondary | ICD-10-CM | POA: Diagnosis not present

## 2019-02-05 DIAGNOSIS — M24562 Contracture, left knee: Secondary | ICD-10-CM | POA: Diagnosis not present

## 2019-02-05 DIAGNOSIS — F0151 Vascular dementia with behavioral disturbance: Secondary | ICD-10-CM | POA: Diagnosis not present

## 2019-02-05 DIAGNOSIS — M24561 Contracture, right knee: Secondary | ICD-10-CM | POA: Diagnosis not present

## 2019-02-06 DIAGNOSIS — M24561 Contracture, right knee: Secondary | ICD-10-CM | POA: Diagnosis not present

## 2019-02-06 DIAGNOSIS — F0151 Vascular dementia with behavioral disturbance: Secondary | ICD-10-CM | POA: Diagnosis not present

## 2019-02-06 DIAGNOSIS — M24562 Contracture, left knee: Secondary | ICD-10-CM | POA: Diagnosis not present

## 2019-02-07 DIAGNOSIS — M24562 Contracture, left knee: Secondary | ICD-10-CM | POA: Diagnosis not present

## 2019-02-07 DIAGNOSIS — F330 Major depressive disorder, recurrent, mild: Secondary | ICD-10-CM | POA: Diagnosis not present

## 2019-02-07 DIAGNOSIS — F0151 Vascular dementia with behavioral disturbance: Secondary | ICD-10-CM | POA: Diagnosis not present

## 2019-02-07 DIAGNOSIS — F418 Other specified anxiety disorders: Secondary | ICD-10-CM | POA: Diagnosis not present

## 2019-02-07 DIAGNOSIS — M24561 Contracture, right knee: Secondary | ICD-10-CM | POA: Diagnosis not present

## 2019-02-09 DIAGNOSIS — M24562 Contracture, left knee: Secondary | ICD-10-CM | POA: Diagnosis not present

## 2019-02-09 DIAGNOSIS — F0151 Vascular dementia with behavioral disturbance: Secondary | ICD-10-CM | POA: Diagnosis not present

## 2019-02-09 DIAGNOSIS — M24561 Contracture, right knee: Secondary | ICD-10-CM | POA: Diagnosis not present

## 2019-02-11 DIAGNOSIS — F0151 Vascular dementia with behavioral disturbance: Secondary | ICD-10-CM | POA: Diagnosis not present

## 2019-02-11 DIAGNOSIS — M24561 Contracture, right knee: Secondary | ICD-10-CM | POA: Diagnosis not present

## 2019-02-11 DIAGNOSIS — M24562 Contracture, left knee: Secondary | ICD-10-CM | POA: Diagnosis not present

## 2019-02-12 DIAGNOSIS — F0151 Vascular dementia with behavioral disturbance: Secondary | ICD-10-CM | POA: Diagnosis not present

## 2019-02-12 DIAGNOSIS — M24561 Contracture, right knee: Secondary | ICD-10-CM | POA: Diagnosis not present

## 2019-02-12 DIAGNOSIS — M24562 Contracture, left knee: Secondary | ICD-10-CM | POA: Diagnosis not present

## 2019-02-13 DIAGNOSIS — M24561 Contracture, right knee: Secondary | ICD-10-CM | POA: Diagnosis not present

## 2019-02-13 DIAGNOSIS — M24562 Contracture, left knee: Secondary | ICD-10-CM | POA: Diagnosis not present

## 2019-02-13 DIAGNOSIS — F0151 Vascular dementia with behavioral disturbance: Secondary | ICD-10-CM | POA: Diagnosis not present

## 2019-02-14 DIAGNOSIS — F0151 Vascular dementia with behavioral disturbance: Secondary | ICD-10-CM | POA: Diagnosis not present

## 2019-02-14 DIAGNOSIS — M24562 Contracture, left knee: Secondary | ICD-10-CM | POA: Diagnosis not present

## 2019-02-14 DIAGNOSIS — M24561 Contracture, right knee: Secondary | ICD-10-CM | POA: Diagnosis not present

## 2019-02-15 DIAGNOSIS — M24562 Contracture, left knee: Secondary | ICD-10-CM | POA: Diagnosis not present

## 2019-02-15 DIAGNOSIS — F0151 Vascular dementia with behavioral disturbance: Secondary | ICD-10-CM | POA: Diagnosis not present

## 2019-02-15 DIAGNOSIS — M24561 Contracture, right knee: Secondary | ICD-10-CM | POA: Diagnosis not present

## 2019-02-18 DIAGNOSIS — F0151 Vascular dementia with behavioral disturbance: Secondary | ICD-10-CM | POA: Diagnosis not present

## 2019-02-18 DIAGNOSIS — M24561 Contracture, right knee: Secondary | ICD-10-CM | POA: Diagnosis not present

## 2019-02-18 DIAGNOSIS — M24562 Contracture, left knee: Secondary | ICD-10-CM | POA: Diagnosis not present

## 2019-02-19 DIAGNOSIS — F0151 Vascular dementia with behavioral disturbance: Secondary | ICD-10-CM | POA: Diagnosis not present

## 2019-02-19 DIAGNOSIS — M24562 Contracture, left knee: Secondary | ICD-10-CM | POA: Diagnosis not present

## 2019-02-19 DIAGNOSIS — M24561 Contracture, right knee: Secondary | ICD-10-CM | POA: Diagnosis not present

## 2019-02-20 DIAGNOSIS — M24562 Contracture, left knee: Secondary | ICD-10-CM | POA: Diagnosis not present

## 2019-02-20 DIAGNOSIS — M24561 Contracture, right knee: Secondary | ICD-10-CM | POA: Diagnosis not present

## 2019-02-20 DIAGNOSIS — F0151 Vascular dementia with behavioral disturbance: Secondary | ICD-10-CM | POA: Diagnosis not present

## 2019-02-20 IMAGING — DX DG FEMUR 2+V*R*
2 series · 3 of 3 positions shown · non-contrast
Comparison: None.

CLINICAL DATA: Fell yesterday, gluteal pain.

EXAM:
DG HIP (WITH OR WITHOUT PELVIS) 2-3V RIGHT; RIGHT FEMUR 2 VIEWS

[femur ap]
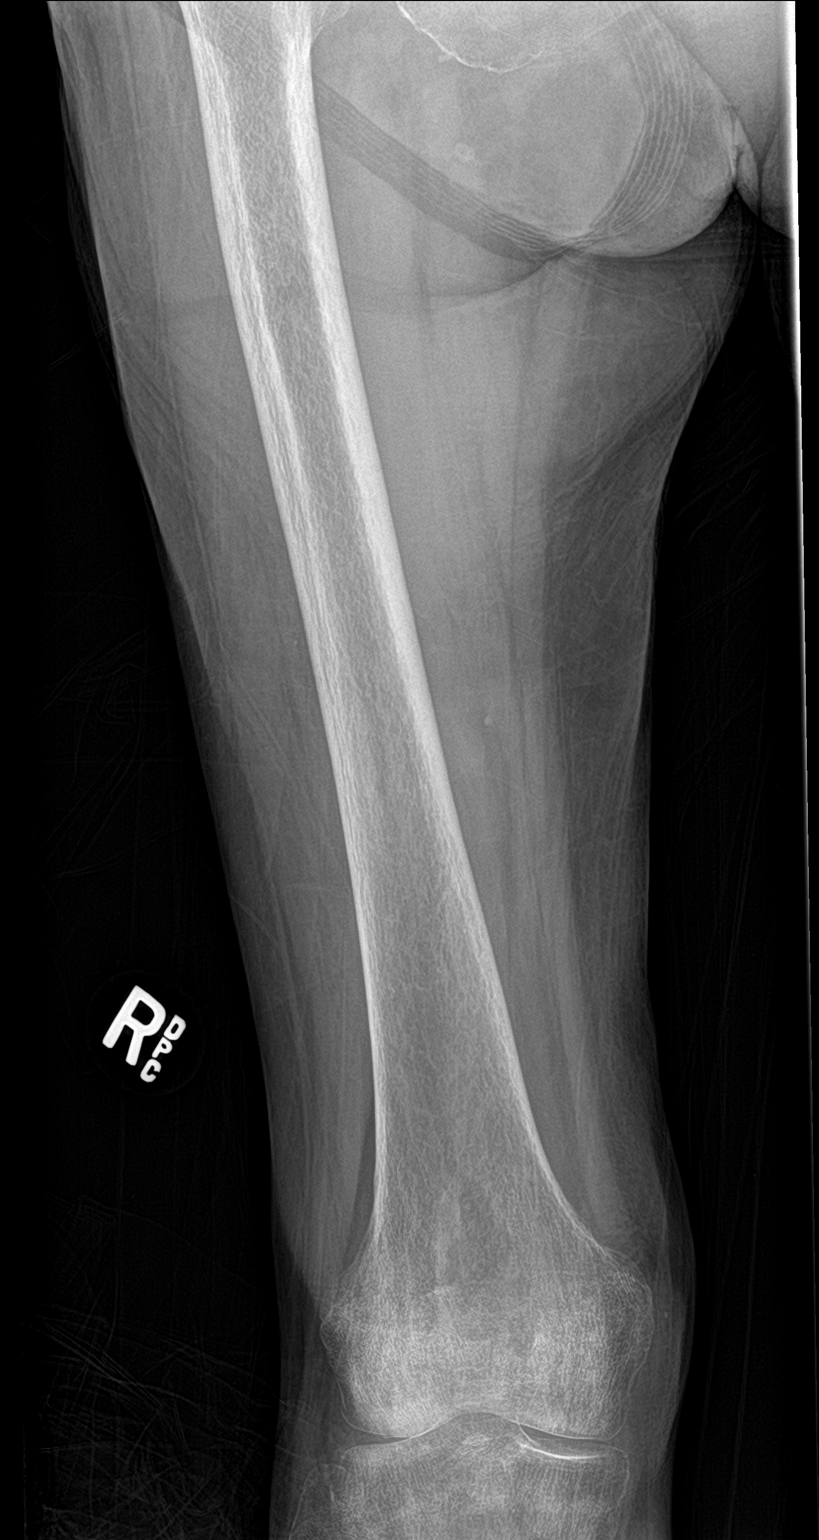

[Series 3: femur lat · 0.14mm/px · 2 of 2 slices shown]
[im 1/2]
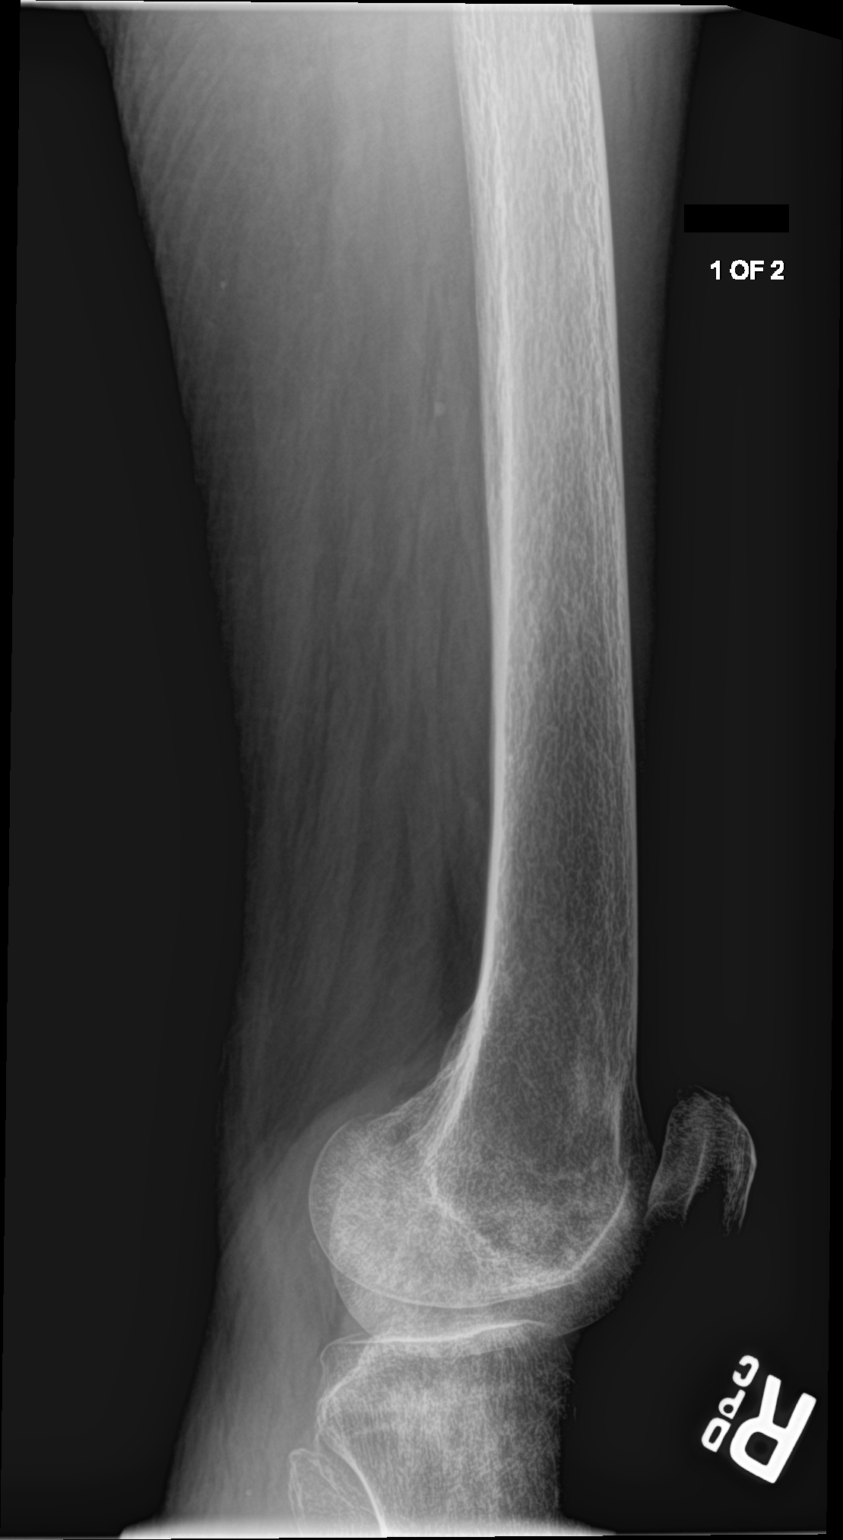
[im 2/2]
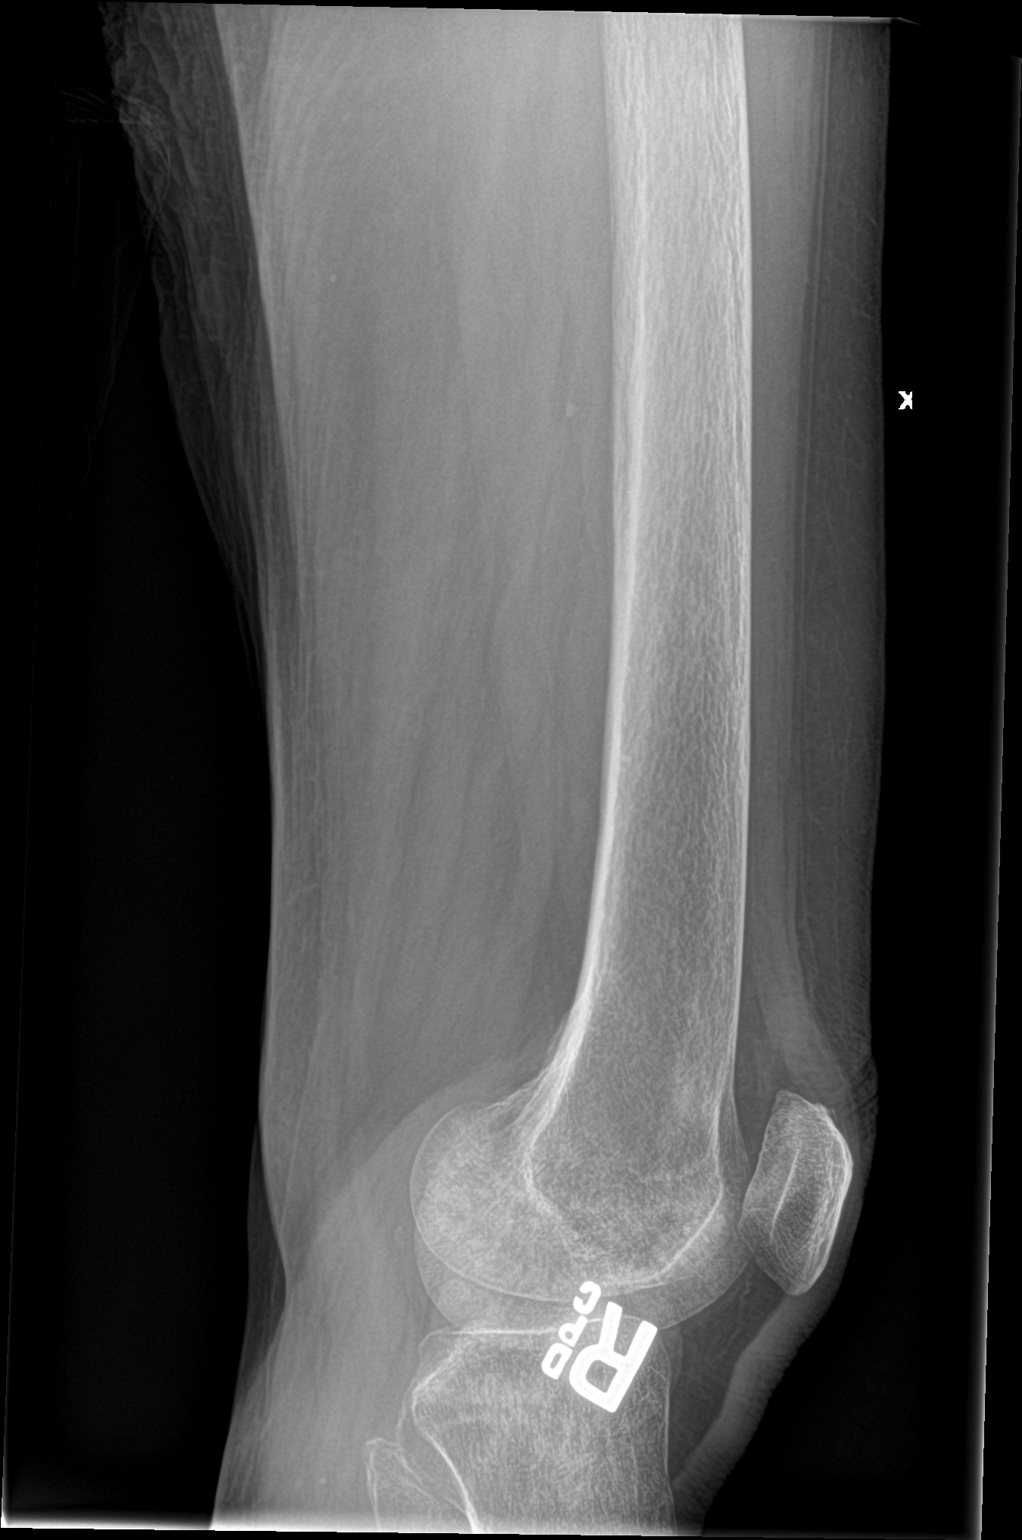

[3 of 3 positions shown; findings below may reference images not displayed]

FINDINGS: No acute fracture deformity. Old appearing LEFT pubic symphysis and
LEFT inferior pubic ramus fractures. Severe bilateral hip
osteoarthrosis with flattened femoral heads with shallow acetabula.
Osteopenia without destructive bony lesions. Soft tissue planes are
nonsuspicious.
IMPRESSION: No acute fracture deformity or dislocation.

Severe bilateral hip osteoarthrosis and secondary femoral head
avascular necrosis.

## 2019-02-20 IMAGING — DX DG HIP (WITH OR WITHOUT PELVIS) 2-3V*R*
3 series · 3 of 3 positions shown · non-contrast
Comparison: None.

CLINICAL DATA: Fell yesterday, gluteal pain.

EXAM:
DG HIP (WITH OR WITHOUT PELVIS) 2-3V RIGHT; RIGHT FEMUR 2 VIEWS

[pelvis ap]
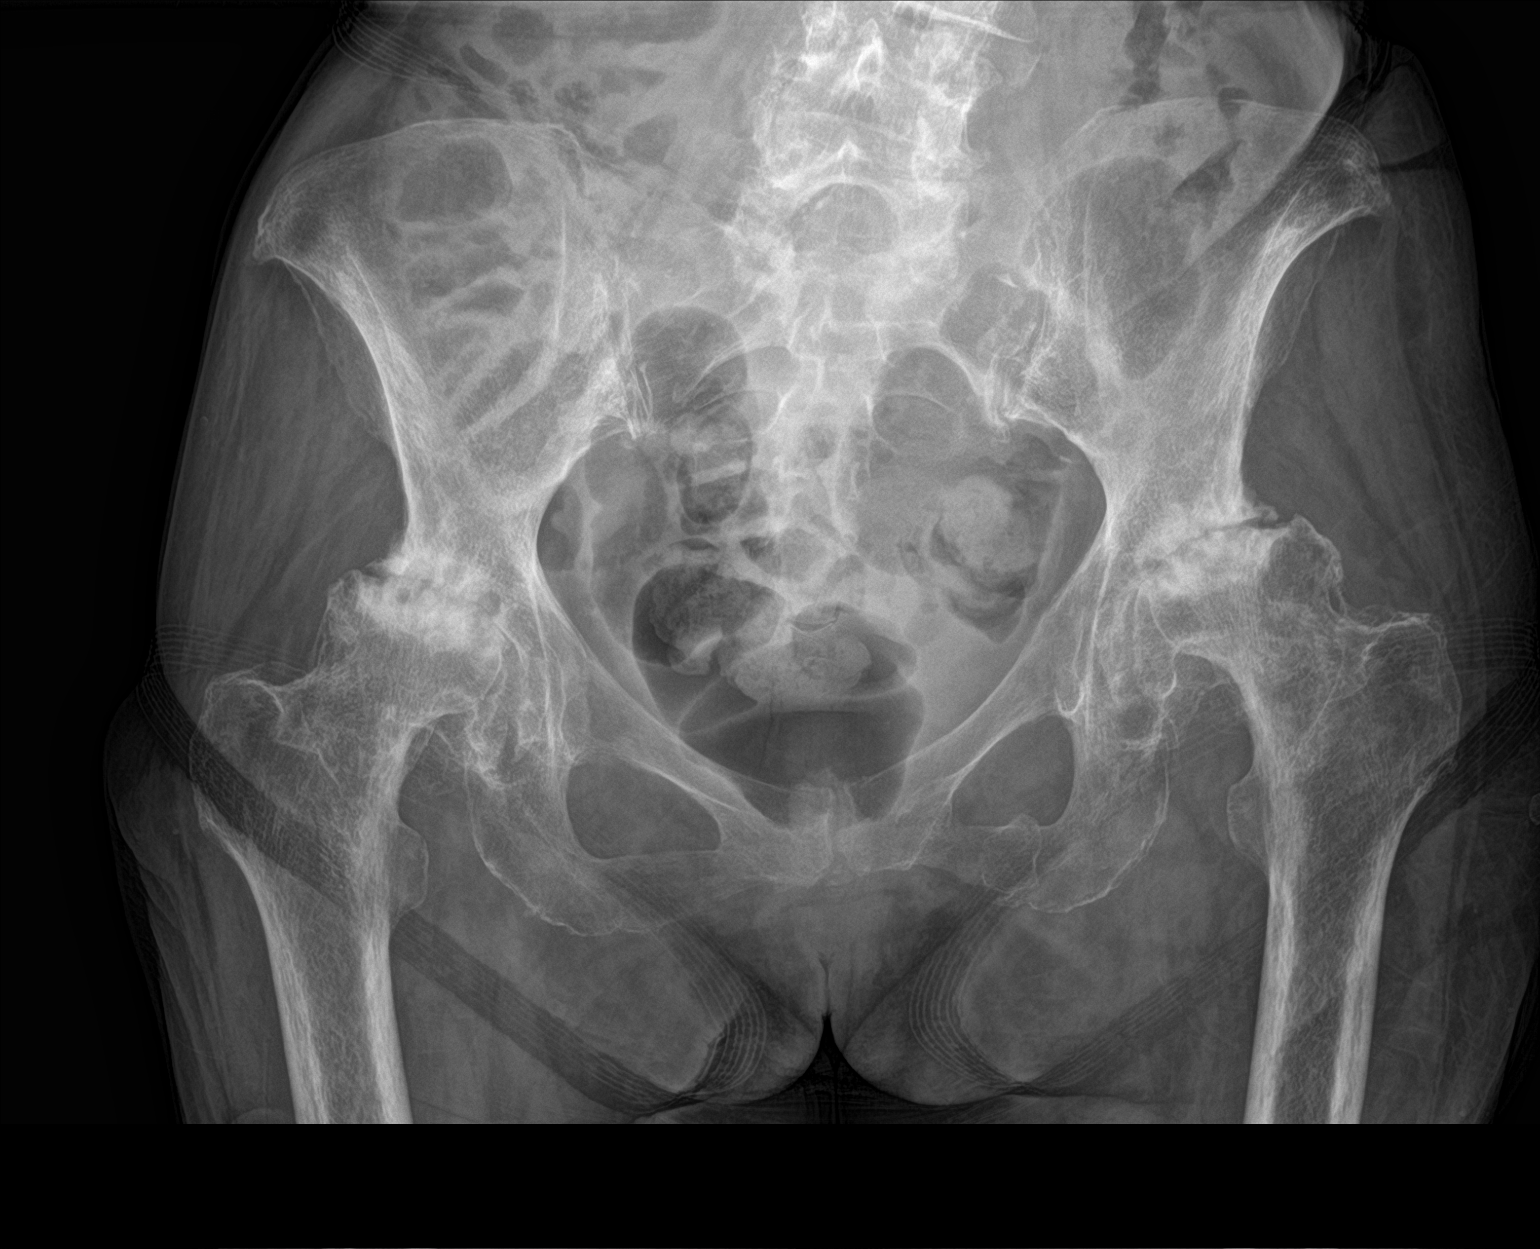

[hip ap]
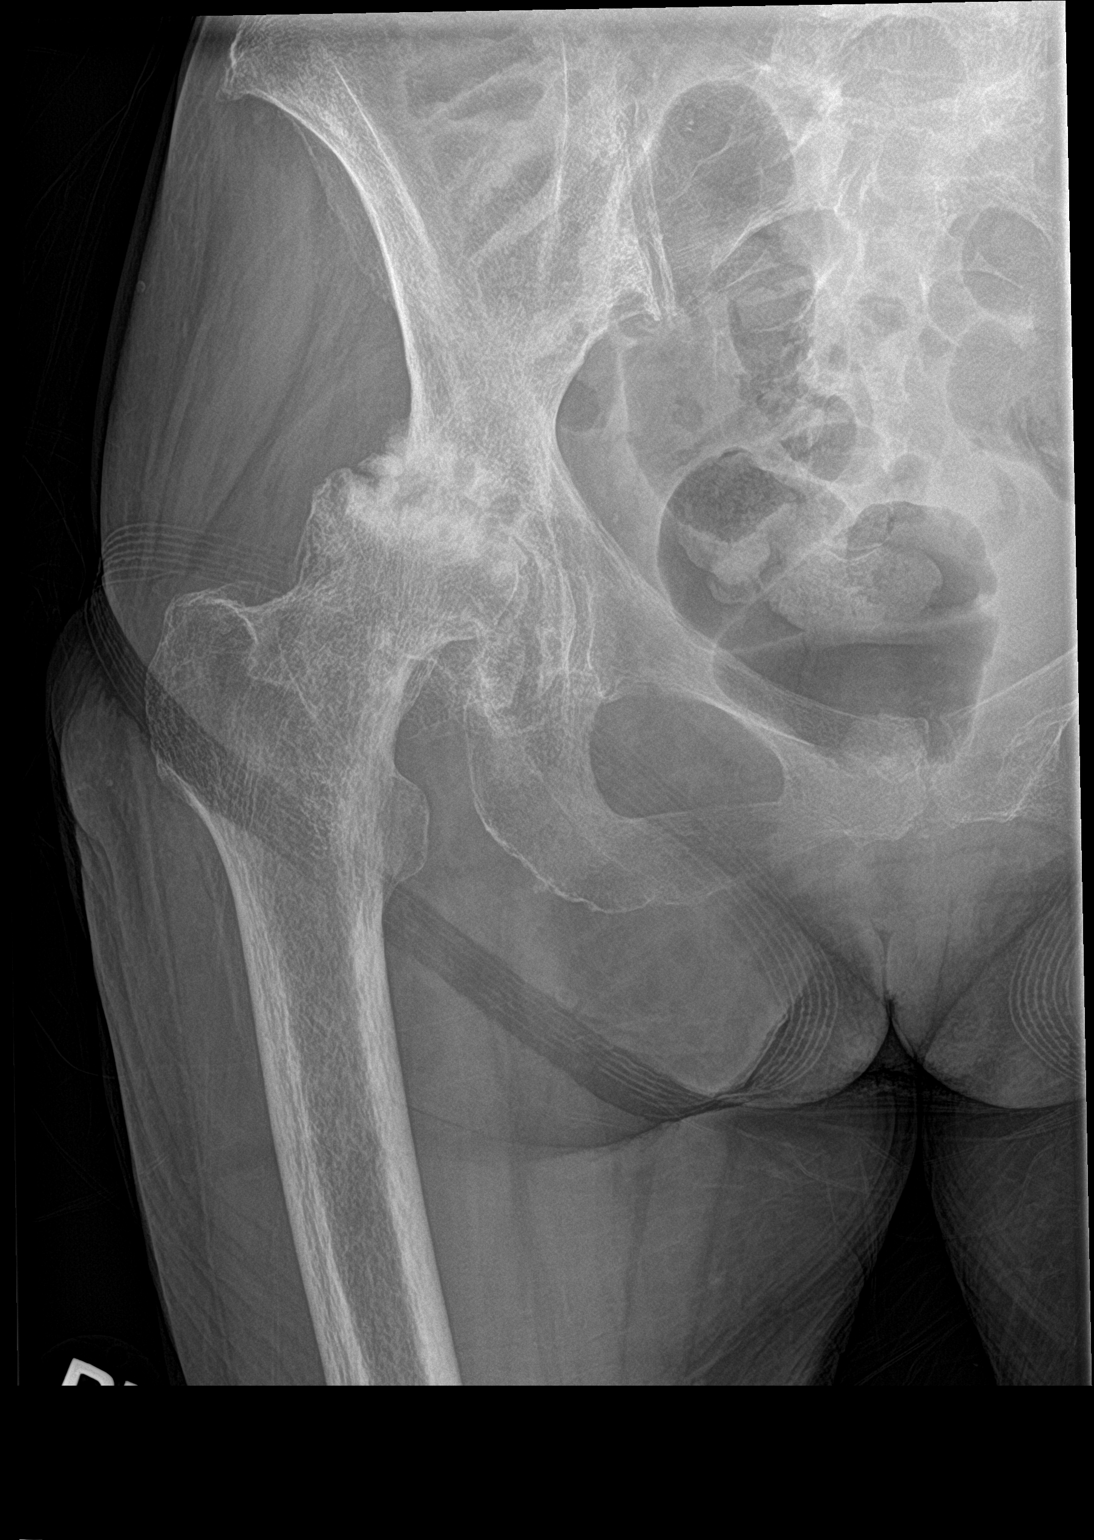

[hip lat]
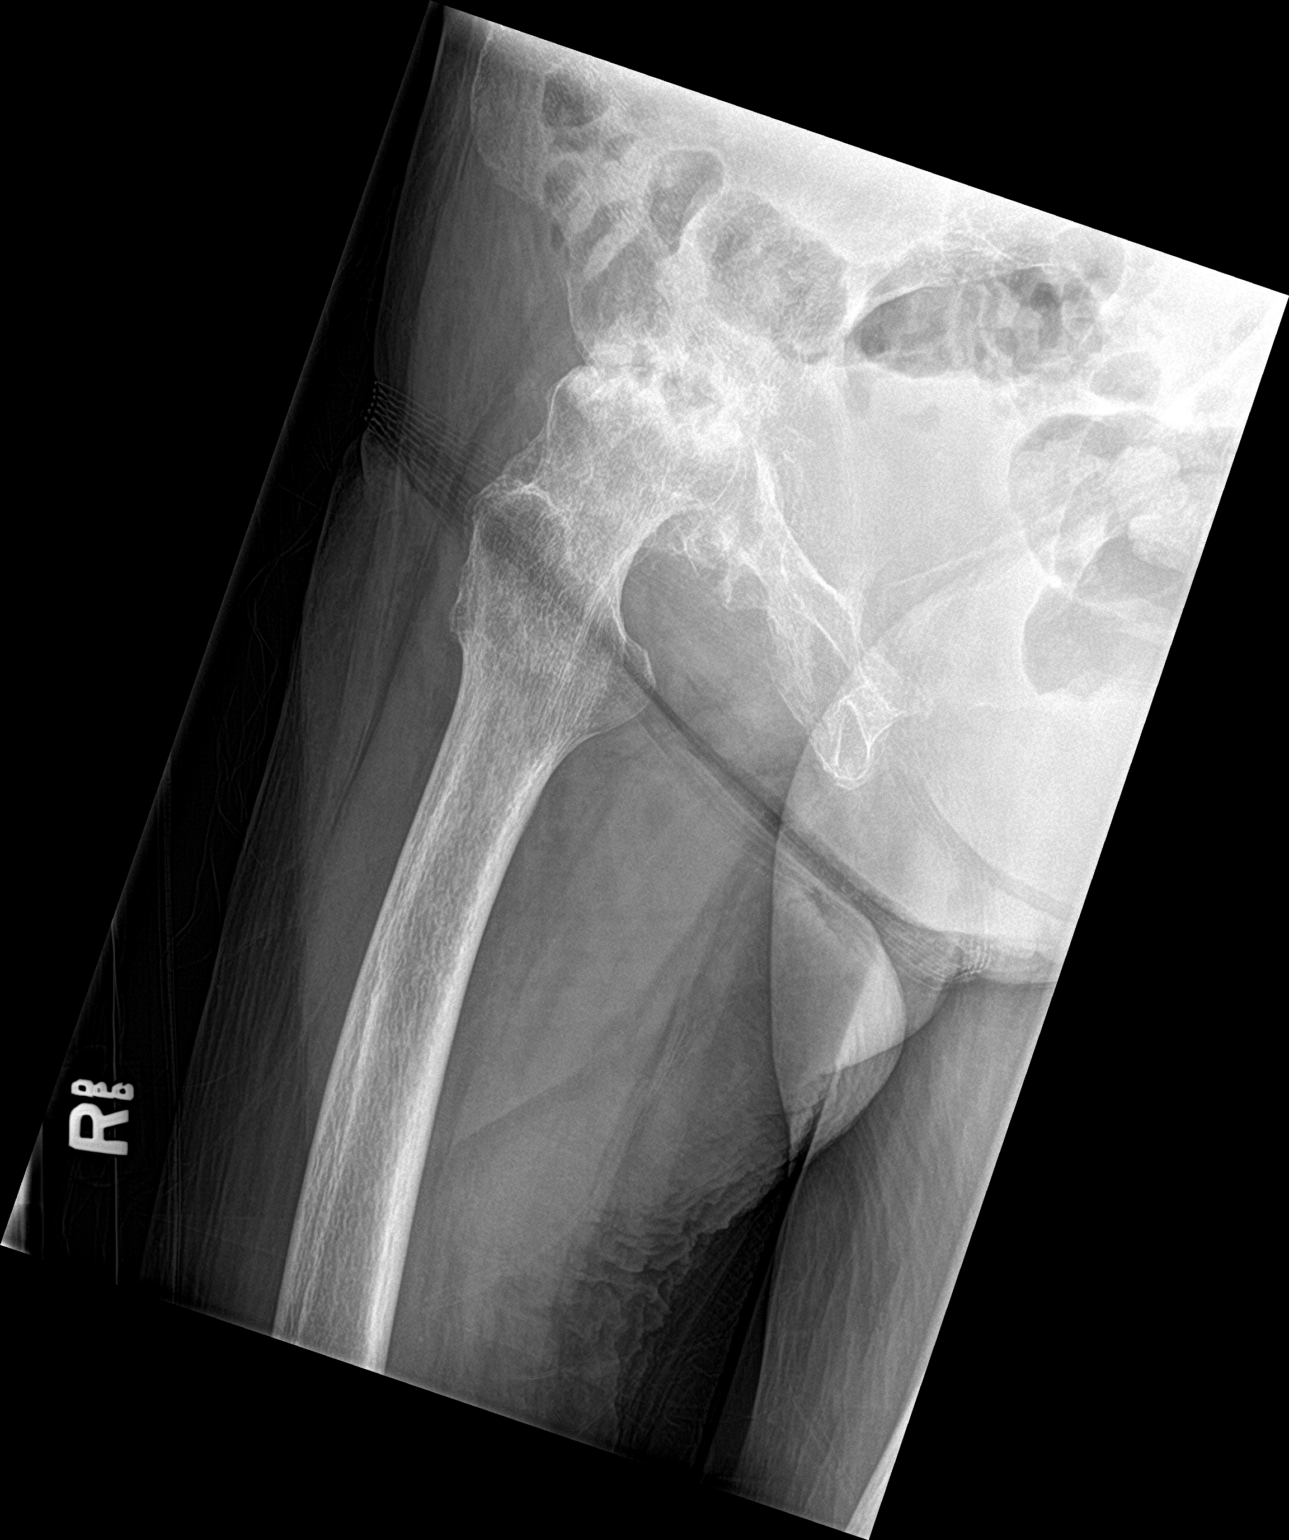

[3 of 3 positions shown; findings below may reference images not displayed]

FINDINGS: No acute fracture deformity. Old appearing LEFT pubic symphysis and
LEFT inferior pubic ramus fractures. Severe bilateral hip
osteoarthrosis with flattened femoral heads with shallow acetabula.
Osteopenia without destructive bony lesions. Soft tissue planes are
nonsuspicious.
IMPRESSION: No acute fracture deformity or dislocation.

Severe bilateral hip osteoarthrosis and secondary femoral head
avascular necrosis.

## 2019-02-21 DIAGNOSIS — F0151 Vascular dementia with behavioral disturbance: Secondary | ICD-10-CM | POA: Diagnosis not present

## 2019-02-21 DIAGNOSIS — M24562 Contracture, left knee: Secondary | ICD-10-CM | POA: Diagnosis not present

## 2019-02-21 DIAGNOSIS — M24561 Contracture, right knee: Secondary | ICD-10-CM | POA: Diagnosis not present

## 2019-02-23 DIAGNOSIS — M24562 Contracture, left knee: Secondary | ICD-10-CM | POA: Diagnosis not present

## 2019-02-23 DIAGNOSIS — M24561 Contracture, right knee: Secondary | ICD-10-CM | POA: Diagnosis not present

## 2019-02-23 DIAGNOSIS — F0151 Vascular dementia with behavioral disturbance: Secondary | ICD-10-CM | POA: Diagnosis not present

## 2019-02-24 DIAGNOSIS — F0151 Vascular dementia with behavioral disturbance: Secondary | ICD-10-CM | POA: Diagnosis not present

## 2019-02-24 DIAGNOSIS — M24561 Contracture, right knee: Secondary | ICD-10-CM | POA: Diagnosis not present

## 2019-02-24 DIAGNOSIS — M24562 Contracture, left knee: Secondary | ICD-10-CM | POA: Diagnosis not present

## 2019-02-27 DIAGNOSIS — M24561 Contracture, right knee: Secondary | ICD-10-CM | POA: Diagnosis not present

## 2019-02-27 DIAGNOSIS — M24562 Contracture, left knee: Secondary | ICD-10-CM | POA: Diagnosis not present

## 2019-02-27 DIAGNOSIS — F0151 Vascular dementia with behavioral disturbance: Secondary | ICD-10-CM | POA: Diagnosis not present

## 2019-02-28 DIAGNOSIS — F0151 Vascular dementia with behavioral disturbance: Secondary | ICD-10-CM | POA: Diagnosis not present

## 2019-02-28 DIAGNOSIS — M24562 Contracture, left knee: Secondary | ICD-10-CM | POA: Diagnosis not present

## 2019-02-28 DIAGNOSIS — M24561 Contracture, right knee: Secondary | ICD-10-CM | POA: Diagnosis not present

## 2019-03-01 DIAGNOSIS — M24562 Contracture, left knee: Secondary | ICD-10-CM | POA: Diagnosis not present

## 2019-03-01 DIAGNOSIS — F0151 Vascular dementia with behavioral disturbance: Secondary | ICD-10-CM | POA: Diagnosis not present

## 2019-03-01 DIAGNOSIS — M24561 Contracture, right knee: Secondary | ICD-10-CM | POA: Diagnosis not present

## 2019-03-02 DIAGNOSIS — M24562 Contracture, left knee: Secondary | ICD-10-CM | POA: Diagnosis not present

## 2019-03-02 DIAGNOSIS — M24561 Contracture, right knee: Secondary | ICD-10-CM | POA: Diagnosis not present

## 2019-03-02 DIAGNOSIS — F0151 Vascular dementia with behavioral disturbance: Secondary | ICD-10-CM | POA: Diagnosis not present

## 2019-03-03 DIAGNOSIS — F0151 Vascular dementia with behavioral disturbance: Secondary | ICD-10-CM | POA: Diagnosis not present

## 2019-03-03 DIAGNOSIS — M24562 Contracture, left knee: Secondary | ICD-10-CM | POA: Diagnosis not present

## 2019-03-03 DIAGNOSIS — M24561 Contracture, right knee: Secondary | ICD-10-CM | POA: Diagnosis not present

## 2019-03-05 DIAGNOSIS — M24562 Contracture, left knee: Secondary | ICD-10-CM | POA: Diagnosis not present

## 2019-03-05 DIAGNOSIS — M24561 Contracture, right knee: Secondary | ICD-10-CM | POA: Diagnosis not present

## 2019-03-05 DIAGNOSIS — F0151 Vascular dementia with behavioral disturbance: Secondary | ICD-10-CM | POA: Diagnosis not present

## 2019-03-06 DIAGNOSIS — M24561 Contracture, right knee: Secondary | ICD-10-CM | POA: Diagnosis not present

## 2019-03-06 DIAGNOSIS — F418 Other specified anxiety disorders: Secondary | ICD-10-CM | POA: Diagnosis not present

## 2019-03-06 DIAGNOSIS — F0151 Vascular dementia with behavioral disturbance: Secondary | ICD-10-CM | POA: Diagnosis not present

## 2019-03-06 DIAGNOSIS — M24562 Contracture, left knee: Secondary | ICD-10-CM | POA: Diagnosis not present

## 2019-03-06 DIAGNOSIS — F330 Major depressive disorder, recurrent, mild: Secondary | ICD-10-CM | POA: Diagnosis not present

## 2019-03-09 DIAGNOSIS — M24562 Contracture, left knee: Secondary | ICD-10-CM | POA: Diagnosis not present

## 2019-03-09 DIAGNOSIS — M24561 Contracture, right knee: Secondary | ICD-10-CM | POA: Diagnosis not present

## 2019-03-09 DIAGNOSIS — F0151 Vascular dementia with behavioral disturbance: Secondary | ICD-10-CM | POA: Diagnosis not present

## 2019-03-10 DIAGNOSIS — F0151 Vascular dementia with behavioral disturbance: Secondary | ICD-10-CM | POA: Diagnosis not present

## 2019-03-10 DIAGNOSIS — M24561 Contracture, right knee: Secondary | ICD-10-CM | POA: Diagnosis not present

## 2019-03-10 DIAGNOSIS — M24562 Contracture, left knee: Secondary | ICD-10-CM | POA: Diagnosis not present

## 2019-03-12 DIAGNOSIS — M24562 Contracture, left knee: Secondary | ICD-10-CM | POA: Diagnosis not present

## 2019-03-12 DIAGNOSIS — E559 Vitamin D deficiency, unspecified: Secondary | ICD-10-CM | POA: Diagnosis not present

## 2019-03-12 DIAGNOSIS — F0151 Vascular dementia with behavioral disturbance: Secondary | ICD-10-CM | POA: Diagnosis not present

## 2019-03-12 DIAGNOSIS — M24561 Contracture, right knee: Secondary | ICD-10-CM | POA: Diagnosis not present

## 2019-03-13 DIAGNOSIS — M24562 Contracture, left knee: Secondary | ICD-10-CM | POA: Diagnosis not present

## 2019-03-13 DIAGNOSIS — M24561 Contracture, right knee: Secondary | ICD-10-CM | POA: Diagnosis not present

## 2019-03-13 DIAGNOSIS — F0151 Vascular dementia with behavioral disturbance: Secondary | ICD-10-CM | POA: Diagnosis not present

## 2019-03-14 DIAGNOSIS — M24561 Contracture, right knee: Secondary | ICD-10-CM | POA: Diagnosis not present

## 2019-03-14 DIAGNOSIS — F0151 Vascular dementia with behavioral disturbance: Secondary | ICD-10-CM | POA: Diagnosis not present

## 2019-03-14 DIAGNOSIS — M24562 Contracture, left knee: Secondary | ICD-10-CM | POA: Diagnosis not present

## 2019-03-15 DIAGNOSIS — E559 Vitamin D deficiency, unspecified: Secondary | ICD-10-CM | POA: Diagnosis not present

## 2019-03-15 DIAGNOSIS — E785 Hyperlipidemia, unspecified: Secondary | ICD-10-CM | POA: Diagnosis not present

## 2019-03-15 DIAGNOSIS — M24561 Contracture, right knee: Secondary | ICD-10-CM | POA: Diagnosis not present

## 2019-03-15 DIAGNOSIS — M24562 Contracture, left knee: Secondary | ICD-10-CM | POA: Diagnosis not present

## 2019-03-15 DIAGNOSIS — I10 Essential (primary) hypertension: Secondary | ICD-10-CM | POA: Diagnosis not present

## 2019-03-15 DIAGNOSIS — F0151 Vascular dementia with behavioral disturbance: Secondary | ICD-10-CM | POA: Diagnosis not present

## 2019-03-15 DIAGNOSIS — M6281 Muscle weakness (generalized): Secondary | ICD-10-CM | POA: Diagnosis not present

## 2019-03-18 DIAGNOSIS — Z1383 Encounter for screening for respiratory disorder NEC: Secondary | ICD-10-CM | POA: Diagnosis not present

## 2019-03-18 DIAGNOSIS — Z20828 Contact with and (suspected) exposure to other viral communicable diseases: Secondary | ICD-10-CM | POA: Diagnosis not present

## 2019-03-27 DIAGNOSIS — E785 Hyperlipidemia, unspecified: Secondary | ICD-10-CM | POA: Diagnosis not present

## 2019-03-27 DIAGNOSIS — F0390 Unspecified dementia without behavioral disturbance: Secondary | ICD-10-CM | POA: Diagnosis not present

## 2019-03-27 DIAGNOSIS — M6281 Muscle weakness (generalized): Secondary | ICD-10-CM | POA: Diagnosis not present

## 2019-03-27 DIAGNOSIS — I10 Essential (primary) hypertension: Secondary | ICD-10-CM | POA: Diagnosis not present

## 2019-03-27 DIAGNOSIS — E559 Vitamin D deficiency, unspecified: Secondary | ICD-10-CM | POA: Diagnosis not present

## 2019-04-03 DIAGNOSIS — F418 Other specified anxiety disorders: Secondary | ICD-10-CM | POA: Diagnosis not present

## 2019-04-03 DIAGNOSIS — F330 Major depressive disorder, recurrent, mild: Secondary | ICD-10-CM | POA: Diagnosis not present

## 2019-04-03 DIAGNOSIS — F0151 Vascular dementia with behavioral disturbance: Secondary | ICD-10-CM | POA: Diagnosis not present

## 2019-04-29 DIAGNOSIS — F0390 Unspecified dementia without behavioral disturbance: Secondary | ICD-10-CM | POA: Diagnosis not present

## 2019-04-29 DIAGNOSIS — I10 Essential (primary) hypertension: Secondary | ICD-10-CM | POA: Diagnosis not present

## 2019-04-29 DIAGNOSIS — E785 Hyperlipidemia, unspecified: Secondary | ICD-10-CM | POA: Diagnosis not present

## 2019-04-29 DIAGNOSIS — E559 Vitamin D deficiency, unspecified: Secondary | ICD-10-CM | POA: Diagnosis not present

## 2019-04-29 DIAGNOSIS — L97509 Non-pressure chronic ulcer of other part of unspecified foot with unspecified severity: Secondary | ICD-10-CM | POA: Diagnosis not present

## 2019-04-29 DIAGNOSIS — M6281 Muscle weakness (generalized): Secondary | ICD-10-CM | POA: Diagnosis not present

## 2019-04-29 DIAGNOSIS — F329 Major depressive disorder, single episode, unspecified: Secondary | ICD-10-CM | POA: Diagnosis not present

## 2019-05-01 DIAGNOSIS — F330 Major depressive disorder, recurrent, mild: Secondary | ICD-10-CM | POA: Diagnosis not present

## 2019-05-01 DIAGNOSIS — F418 Other specified anxiety disorders: Secondary | ICD-10-CM | POA: Diagnosis not present

## 2019-05-01 DIAGNOSIS — F0151 Vascular dementia with behavioral disturbance: Secondary | ICD-10-CM | POA: Diagnosis not present

## 2019-05-06 DIAGNOSIS — H26493 Other secondary cataract, bilateral: Secondary | ICD-10-CM | POA: Diagnosis not present

## 2019-05-06 DIAGNOSIS — H04123 Dry eye syndrome of bilateral lacrimal glands: Secondary | ICD-10-CM | POA: Diagnosis not present

## 2019-05-06 DIAGNOSIS — Z961 Presence of intraocular lens: Secondary | ICD-10-CM | POA: Diagnosis not present

## 2019-05-07 DIAGNOSIS — M6281 Muscle weakness (generalized): Secondary | ICD-10-CM | POA: Diagnosis not present

## 2019-05-07 DIAGNOSIS — L97509 Non-pressure chronic ulcer of other part of unspecified foot with unspecified severity: Secondary | ICD-10-CM | POA: Diagnosis not present

## 2019-05-07 DIAGNOSIS — E785 Hyperlipidemia, unspecified: Secondary | ICD-10-CM | POA: Diagnosis not present

## 2019-05-07 DIAGNOSIS — E559 Vitamin D deficiency, unspecified: Secondary | ICD-10-CM | POA: Diagnosis not present

## 2019-05-07 DIAGNOSIS — M24561 Contracture, right knee: Secondary | ICD-10-CM | POA: Diagnosis not present

## 2019-05-07 DIAGNOSIS — F329 Major depressive disorder, single episode, unspecified: Secondary | ICD-10-CM | POA: Diagnosis not present

## 2019-05-07 DIAGNOSIS — F0390 Unspecified dementia without behavioral disturbance: Secondary | ICD-10-CM | POA: Diagnosis not present

## 2019-05-07 DIAGNOSIS — I10 Essential (primary) hypertension: Secondary | ICD-10-CM | POA: Diagnosis not present

## 2019-05-07 DIAGNOSIS — R293 Abnormal posture: Secondary | ICD-10-CM | POA: Diagnosis not present

## 2019-05-08 DIAGNOSIS — R293 Abnormal posture: Secondary | ICD-10-CM | POA: Diagnosis not present

## 2019-05-08 DIAGNOSIS — M24561 Contracture, right knee: Secondary | ICD-10-CM | POA: Diagnosis not present

## 2019-05-09 DIAGNOSIS — M24561 Contracture, right knee: Secondary | ICD-10-CM | POA: Diagnosis not present

## 2019-05-09 DIAGNOSIS — R293 Abnormal posture: Secondary | ICD-10-CM | POA: Diagnosis not present

## 2019-05-10 DIAGNOSIS — R293 Abnormal posture: Secondary | ICD-10-CM | POA: Diagnosis not present

## 2019-05-10 DIAGNOSIS — M24561 Contracture, right knee: Secondary | ICD-10-CM | POA: Diagnosis not present

## 2019-05-13 DIAGNOSIS — M24561 Contracture, right knee: Secondary | ICD-10-CM | POA: Diagnosis not present

## 2019-05-13 DIAGNOSIS — R293 Abnormal posture: Secondary | ICD-10-CM | POA: Diagnosis not present

## 2019-05-14 DIAGNOSIS — R293 Abnormal posture: Secondary | ICD-10-CM | POA: Diagnosis not present

## 2019-05-14 DIAGNOSIS — M24561 Contracture, right knee: Secondary | ICD-10-CM | POA: Diagnosis not present

## 2019-05-15 DIAGNOSIS — R293 Abnormal posture: Secondary | ICD-10-CM | POA: Diagnosis not present

## 2019-05-15 DIAGNOSIS — M24561 Contracture, right knee: Secondary | ICD-10-CM | POA: Diagnosis not present

## 2019-05-16 DIAGNOSIS — R293 Abnormal posture: Secondary | ICD-10-CM | POA: Diagnosis not present

## 2019-05-16 DIAGNOSIS — M24561 Contracture, right knee: Secondary | ICD-10-CM | POA: Diagnosis not present

## 2019-05-17 DIAGNOSIS — M24561 Contracture, right knee: Secondary | ICD-10-CM | POA: Diagnosis not present

## 2019-05-17 DIAGNOSIS — R293 Abnormal posture: Secondary | ICD-10-CM | POA: Diagnosis not present

## 2019-05-20 ENCOUNTER — Other Ambulatory Visit: Payer: Self-pay

## 2019-05-20 DIAGNOSIS — R293 Abnormal posture: Secondary | ICD-10-CM | POA: Diagnosis not present

## 2019-05-20 DIAGNOSIS — M24561 Contracture, right knee: Secondary | ICD-10-CM | POA: Diagnosis not present

## 2019-05-21 DIAGNOSIS — R293 Abnormal posture: Secondary | ICD-10-CM | POA: Diagnosis not present

## 2019-05-21 DIAGNOSIS — M24561 Contracture, right knee: Secondary | ICD-10-CM | POA: Diagnosis not present

## 2019-05-22 DIAGNOSIS — R293 Abnormal posture: Secondary | ICD-10-CM | POA: Diagnosis not present

## 2019-05-22 DIAGNOSIS — M24561 Contracture, right knee: Secondary | ICD-10-CM | POA: Diagnosis not present

## 2019-05-24 DIAGNOSIS — Z20828 Contact with and (suspected) exposure to other viral communicable diseases: Secondary | ICD-10-CM | POA: Diagnosis not present

## 2019-05-24 DIAGNOSIS — R293 Abnormal posture: Secondary | ICD-10-CM | POA: Diagnosis not present

## 2019-05-24 DIAGNOSIS — Z1383 Encounter for screening for respiratory disorder NEC: Secondary | ICD-10-CM | POA: Diagnosis not present

## 2019-05-24 DIAGNOSIS — M24561 Contracture, right knee: Secondary | ICD-10-CM | POA: Diagnosis not present

## 2019-05-27 DIAGNOSIS — M24561 Contracture, right knee: Secondary | ICD-10-CM | POA: Diagnosis not present

## 2019-05-27 DIAGNOSIS — R293 Abnormal posture: Secondary | ICD-10-CM | POA: Diagnosis not present

## 2019-05-28 DIAGNOSIS — R293 Abnormal posture: Secondary | ICD-10-CM | POA: Diagnosis not present

## 2019-05-28 DIAGNOSIS — M24561 Contracture, right knee: Secondary | ICD-10-CM | POA: Diagnosis not present

## 2019-05-30 DIAGNOSIS — Z20828 Contact with and (suspected) exposure to other viral communicable diseases: Secondary | ICD-10-CM | POA: Diagnosis not present

## 2019-05-30 DIAGNOSIS — R293 Abnormal posture: Secondary | ICD-10-CM | POA: Diagnosis not present

## 2019-05-30 DIAGNOSIS — M24561 Contracture, right knee: Secondary | ICD-10-CM | POA: Diagnosis not present

## 2019-05-30 DIAGNOSIS — Z1383 Encounter for screening for respiratory disorder NEC: Secondary | ICD-10-CM | POA: Diagnosis not present

## 2019-05-31 DIAGNOSIS — R293 Abnormal posture: Secondary | ICD-10-CM | POA: Diagnosis not present

## 2019-05-31 DIAGNOSIS — M24561 Contracture, right knee: Secondary | ICD-10-CM | POA: Diagnosis not present

## 2019-06-01 DIAGNOSIS — M24561 Contracture, right knee: Secondary | ICD-10-CM | POA: Diagnosis not present

## 2019-06-01 DIAGNOSIS — R293 Abnormal posture: Secondary | ICD-10-CM | POA: Diagnosis not present

## 2019-06-03 DIAGNOSIS — R293 Abnormal posture: Secondary | ICD-10-CM | POA: Diagnosis not present

## 2019-06-03 DIAGNOSIS — M24561 Contracture, right knee: Secondary | ICD-10-CM | POA: Diagnosis not present

## 2019-06-04 DIAGNOSIS — M24561 Contracture, right knee: Secondary | ICD-10-CM | POA: Diagnosis not present

## 2019-06-04 DIAGNOSIS — R293 Abnormal posture: Secondary | ICD-10-CM | POA: Diagnosis not present

## 2019-06-06 DIAGNOSIS — R293 Abnormal posture: Secondary | ICD-10-CM | POA: Diagnosis not present

## 2019-06-06 DIAGNOSIS — M24561 Contracture, right knee: Secondary | ICD-10-CM | POA: Diagnosis not present

## 2019-06-07 DIAGNOSIS — R293 Abnormal posture: Secondary | ICD-10-CM | POA: Diagnosis not present

## 2019-06-07 DIAGNOSIS — M24561 Contracture, right knee: Secondary | ICD-10-CM | POA: Diagnosis not present

## 2019-06-08 DIAGNOSIS — R293 Abnormal posture: Secondary | ICD-10-CM | POA: Diagnosis not present

## 2019-06-08 DIAGNOSIS — M24561 Contracture, right knee: Secondary | ICD-10-CM | POA: Diagnosis not present

## 2019-06-10 DIAGNOSIS — Z20828 Contact with and (suspected) exposure to other viral communicable diseases: Secondary | ICD-10-CM | POA: Diagnosis not present

## 2019-06-10 DIAGNOSIS — R293 Abnormal posture: Secondary | ICD-10-CM | POA: Diagnosis not present

## 2019-06-10 DIAGNOSIS — Z1383 Encounter for screening for respiratory disorder NEC: Secondary | ICD-10-CM | POA: Diagnosis not present

## 2019-06-10 DIAGNOSIS — M24561 Contracture, right knee: Secondary | ICD-10-CM | POA: Diagnosis not present

## 2019-06-11 DIAGNOSIS — R293 Abnormal posture: Secondary | ICD-10-CM | POA: Diagnosis not present

## 2019-06-11 DIAGNOSIS — M24561 Contracture, right knee: Secondary | ICD-10-CM | POA: Diagnosis not present

## 2019-06-12 DIAGNOSIS — M24561 Contracture, right knee: Secondary | ICD-10-CM | POA: Diagnosis not present

## 2019-06-12 DIAGNOSIS — R293 Abnormal posture: Secondary | ICD-10-CM | POA: Diagnosis not present

## 2019-06-13 DIAGNOSIS — R293 Abnormal posture: Secondary | ICD-10-CM | POA: Diagnosis not present

## 2019-06-13 DIAGNOSIS — M24561 Contracture, right knee: Secondary | ICD-10-CM | POA: Diagnosis not present

## 2019-06-14 DIAGNOSIS — R293 Abnormal posture: Secondary | ICD-10-CM | POA: Diagnosis not present

## 2019-06-14 DIAGNOSIS — M24561 Contracture, right knee: Secondary | ICD-10-CM | POA: Diagnosis not present

## 2019-06-16 DIAGNOSIS — R293 Abnormal posture: Secondary | ICD-10-CM | POA: Diagnosis not present

## 2019-06-16 DIAGNOSIS — M24561 Contracture, right knee: Secondary | ICD-10-CM | POA: Diagnosis not present

## 2019-06-17 DIAGNOSIS — R293 Abnormal posture: Secondary | ICD-10-CM | POA: Diagnosis not present

## 2019-06-17 DIAGNOSIS — M24561 Contracture, right knee: Secondary | ICD-10-CM | POA: Diagnosis not present

## 2019-06-17 DIAGNOSIS — Z20828 Contact with and (suspected) exposure to other viral communicable diseases: Secondary | ICD-10-CM | POA: Diagnosis not present

## 2019-06-17 DIAGNOSIS — Z1383 Encounter for screening for respiratory disorder NEC: Secondary | ICD-10-CM | POA: Diagnosis not present

## 2019-06-18 DIAGNOSIS — R293 Abnormal posture: Secondary | ICD-10-CM | POA: Diagnosis not present

## 2019-06-18 DIAGNOSIS — M24561 Contracture, right knee: Secondary | ICD-10-CM | POA: Diagnosis not present

## 2019-06-20 DIAGNOSIS — R293 Abnormal posture: Secondary | ICD-10-CM | POA: Diagnosis not present

## 2019-06-20 DIAGNOSIS — M24561 Contracture, right knee: Secondary | ICD-10-CM | POA: Diagnosis not present

## 2019-06-21 DIAGNOSIS — R293 Abnormal posture: Secondary | ICD-10-CM | POA: Diagnosis not present

## 2019-06-21 DIAGNOSIS — M24561 Contracture, right knee: Secondary | ICD-10-CM | POA: Diagnosis not present

## 2019-06-24 DIAGNOSIS — Z1383 Encounter for screening for respiratory disorder NEC: Secondary | ICD-10-CM | POA: Diagnosis not present

## 2019-06-24 DIAGNOSIS — Z20828 Contact with and (suspected) exposure to other viral communicable diseases: Secondary | ICD-10-CM | POA: Diagnosis not present

## 2019-06-28 DIAGNOSIS — U071 COVID-19: Secondary | ICD-10-CM | POA: Diagnosis not present

## 2019-06-28 DIAGNOSIS — M6281 Muscle weakness (generalized): Secondary | ICD-10-CM | POA: Diagnosis not present

## 2019-06-28 DIAGNOSIS — F3289 Other specified depressive episodes: Secondary | ICD-10-CM | POA: Diagnosis not present

## 2019-06-28 DIAGNOSIS — I10 Essential (primary) hypertension: Secondary | ICD-10-CM | POA: Diagnosis not present

## 2019-06-28 DIAGNOSIS — L97509 Non-pressure chronic ulcer of other part of unspecified foot with unspecified severity: Secondary | ICD-10-CM | POA: Diagnosis not present

## 2019-06-28 DIAGNOSIS — E559 Vitamin D deficiency, unspecified: Secondary | ICD-10-CM | POA: Diagnosis not present

## 2019-06-28 DIAGNOSIS — E785 Hyperlipidemia, unspecified: Secondary | ICD-10-CM | POA: Diagnosis not present

## 2019-06-28 DIAGNOSIS — F0390 Unspecified dementia without behavioral disturbance: Secondary | ICD-10-CM | POA: Diagnosis not present

## 2019-07-03 DIAGNOSIS — F0151 Vascular dementia with behavioral disturbance: Secondary | ICD-10-CM | POA: Diagnosis not present

## 2019-07-03 DIAGNOSIS — F05 Delirium due to known physiological condition: Secondary | ICD-10-CM | POA: Diagnosis not present

## 2019-07-03 DIAGNOSIS — U071 COVID-19: Secondary | ICD-10-CM | POA: Diagnosis not present

## 2019-07-03 DIAGNOSIS — I10 Essential (primary) hypertension: Secondary | ICD-10-CM | POA: Diagnosis not present

## 2019-07-03 DIAGNOSIS — F3341 Major depressive disorder, recurrent, in partial remission: Secondary | ICD-10-CM | POA: Diagnosis not present

## 2019-07-09 DIAGNOSIS — F419 Anxiety disorder, unspecified: Secondary | ICD-10-CM | POA: Diagnosis not present

## 2019-07-09 DIAGNOSIS — E46 Unspecified protein-calorie malnutrition: Secondary | ICD-10-CM | POA: Diagnosis not present

## 2019-07-09 DIAGNOSIS — F0390 Unspecified dementia without behavioral disturbance: Secondary | ICD-10-CM | POA: Diagnosis not present

## 2019-07-09 DIAGNOSIS — U071 COVID-19: Secondary | ICD-10-CM | POA: Diagnosis not present

## 2019-07-09 DIAGNOSIS — F329 Major depressive disorder, single episode, unspecified: Secondary | ICD-10-CM | POA: Diagnosis not present

## 2019-07-22 DIAGNOSIS — Z23 Encounter for immunization: Secondary | ICD-10-CM | POA: Diagnosis not present

## 2019-07-30 DIAGNOSIS — F0151 Vascular dementia with behavioral disturbance: Secondary | ICD-10-CM | POA: Diagnosis not present

## 2019-07-30 DIAGNOSIS — F3341 Major depressive disorder, recurrent, in partial remission: Secondary | ICD-10-CM | POA: Diagnosis not present

## 2019-07-31 DIAGNOSIS — U071 COVID-19: Secondary | ICD-10-CM | POA: Diagnosis not present

## 2019-07-31 DIAGNOSIS — R41841 Cognitive communication deficit: Secondary | ICD-10-CM | POA: Diagnosis not present

## 2019-07-31 DIAGNOSIS — I739 Peripheral vascular disease, unspecified: Secondary | ICD-10-CM | POA: Diagnosis not present

## 2019-07-31 DIAGNOSIS — F330 Major depressive disorder, recurrent, mild: Secondary | ICD-10-CM | POA: Diagnosis not present

## 2019-08-14 DIAGNOSIS — M24562 Contracture, left knee: Secondary | ICD-10-CM | POA: Diagnosis not present

## 2019-08-14 DIAGNOSIS — F0390 Unspecified dementia without behavioral disturbance: Secondary | ICD-10-CM | POA: Diagnosis not present

## 2019-08-14 DIAGNOSIS — M24561 Contracture, right knee: Secondary | ICD-10-CM | POA: Diagnosis not present

## 2019-08-15 DIAGNOSIS — F0390 Unspecified dementia without behavioral disturbance: Secondary | ICD-10-CM | POA: Diagnosis not present

## 2019-08-15 DIAGNOSIS — M24562 Contracture, left knee: Secondary | ICD-10-CM | POA: Diagnosis not present

## 2019-08-15 DIAGNOSIS — M24561 Contracture, right knee: Secondary | ICD-10-CM | POA: Diagnosis not present

## 2019-08-28 DIAGNOSIS — F0151 Vascular dementia with behavioral disturbance: Secondary | ICD-10-CM | POA: Diagnosis not present

## 2019-08-28 DIAGNOSIS — F3341 Major depressive disorder, recurrent, in partial remission: Secondary | ICD-10-CM | POA: Diagnosis not present

## 2019-09-04 DIAGNOSIS — I10 Essential (primary) hypertension: Secondary | ICD-10-CM | POA: Diagnosis not present

## 2019-09-04 DIAGNOSIS — I739 Peripheral vascular disease, unspecified: Secondary | ICD-10-CM | POA: Diagnosis not present

## 2019-09-04 DIAGNOSIS — F0151 Vascular dementia with behavioral disturbance: Secondary | ICD-10-CM | POA: Diagnosis not present

## 2019-09-04 DIAGNOSIS — F329 Major depressive disorder, single episode, unspecified: Secondary | ICD-10-CM | POA: Diagnosis not present

## 2019-09-11 DIAGNOSIS — L97509 Non-pressure chronic ulcer of other part of unspecified foot with unspecified severity: Secondary | ICD-10-CM | POA: Diagnosis not present

## 2019-09-11 DIAGNOSIS — F0151 Vascular dementia with behavioral disturbance: Secondary | ICD-10-CM | POA: Diagnosis not present

## 2019-09-11 DIAGNOSIS — F3341 Major depressive disorder, recurrent, in partial remission: Secondary | ICD-10-CM | POA: Diagnosis not present

## 2019-09-25 DIAGNOSIS — F3341 Major depressive disorder, recurrent, in partial remission: Secondary | ICD-10-CM | POA: Diagnosis not present

## 2019-09-25 DIAGNOSIS — F0151 Vascular dementia with behavioral disturbance: Secondary | ICD-10-CM | POA: Diagnosis not present

## 2020-05-27 ENCOUNTER — Ambulatory Visit (INDEPENDENT_AMBULATORY_CARE_PROVIDER_SITE_OTHER): Payer: Medicare Other | Admitting: Family

## 2020-05-27 ENCOUNTER — Encounter: Payer: Self-pay | Admitting: Family

## 2020-05-27 ENCOUNTER — Ambulatory Visit (INDEPENDENT_AMBULATORY_CARE_PROVIDER_SITE_OTHER): Payer: Medicare Other

## 2020-05-27 ENCOUNTER — Ambulatory Visit: Payer: Self-pay

## 2020-05-27 VITALS — Ht 68.0 in | Wt 90.0 lb

## 2020-05-27 DIAGNOSIS — M898X1 Other specified disorders of bone, shoulder: Secondary | ICD-10-CM | POA: Diagnosis not present

## 2020-05-27 NOTE — Progress Notes (Signed)
Office Visit Note   Patient: Sandra Hamilton           Date of Birth: 19-Jan-1936           MRN: [id number] Visit Date: 05/27/2020              Requested by: Hilbert Corrigan, [place] [place],  [place] [address] PCP: Hilbert Corrigan, MD  Chief Complaint  Patient presents with  . Right Shoulder - Pain    Possible clavicle fx       HPI: The patient is an 84 year old woman who resides at [facility].  She is accompanied by an aide today.  She presents for concern of Clavicle fracture on the right she had radiographs performed on 05/25/20 which showed a lateral clavicle fracture on the right unfortunately they do not have the images today just the report.  There is no current knowledge of what the event was that resulted in injury or the patient being sent for radiographs.  Unfortunately the patient due to dementia is unable to provide history.  Assessment & Plan: Visit Diagnoses:  1. Pain of right clavicle   2. Pain of left clavicle     Plan: Radiographs today reveal a remote appearing distal clavicle fracture.  She may continue using bilateral upper extremities without restriction.  She will follow-up in office as needed.  Follow-Up Instructions: Return if symptoms worsen or fail to improve.   Ortho Exam  Patient is alert, pleasantly demented.  Does not voice any pain or concern. no adenopathy, well-dressed, normal affect, normal respiratory effort.  With palpation of the distal right clavicle she voices mild pain asks me to stop.  She does move the right and left upper extremities freely.  There is no erythema no ecchymosis no edema   Imaging: XR Clavicle Right  Result Date: 05/27/2020 Radiographs of right clavicle show a remote appearing distal clavicle fracture with minimal displacement. Well marcated edges.   No images are attached to the encounter.  Labs: Lab Results  Component Value Date   REPTSTATUS 12/02/2017 FINAL 11/29/2017     GRAMSTAIN  02/23/2016    RARE WBC PRESENT, PREDOMINANTLY PMN NO SQUAMOUS EPITHELIAL CELLS SEEN MODERATE GRAM POSITIVE COCCI IN PAIRS IN CLUSTERS Performed at [facility]    CULT >=100,000 COLONIES/mL ESCHERICHIA COLI (A) 11/29/2017   LABORGA ESCHERICHIA COLI (A) 11/29/2017     Lab Results  Component Value Date   ALBUMIN 3.4 (L) 11/29/2017   ALBUMIN 2.0 (L) 03/21/2016   ALBUMIN 3.2 (L) 03/15/2016    Lab Results  Component Value Date   MG 1.7 03/15/2016   No results found for: VD25OH  No results found for: PREALBUMIN CBC EXTENDED Latest Ref Rng & Units 11/29/2017 03/16/2017 03/21/2016  WBC 4.0 - 10.5 K/uL 8.0 7.0 7.5  RBC 3.87 - 5.11 MIL/uL 4.46 4.18 3.66(L)  HGB 12.0 - 15.0 g/dL 11.9(L) 11.7(L) 9.7(L)  HCT 36 - 46 % 38.2 36.0 30.7(L)  PLT 150 - 400 K/uL 274 231 253  NEUTROABS 1.7 - 7.7 K/uL 6.2 5.0 -  LYMPHSABS 0.7 - 4.0 K/uL 1.1 1.3 -     Body mass index is 13.68 kg/m.  Orders:  Orders Placed This Encounter  Procedures  . XR Clavicle Right  . XR Clavicle Left   No orders of the defined types were placed in this encounter.    Procedures: No procedures performed  Clinical Data: No additional findings.  ROS:  All  other systems negative, except as noted in the HPI. Review of Systems  Objective: Vital Signs: Ht 5\' 8"  (1.727 m)   Wt 90 lb (40.8 kg)   BMI 13.68 kg/m   Specialty Comments:  No specialty comments available.  PMFS History: Patient Active Problem List   Diagnosis Date Noted  . Dementia ([facility]) 03/07/2016  . Essential hypertension 03/07/2016  . Hyperlipidemia with target LDL less than 100 01/05/2015   Past Medical History:  Diagnosis Date  . Arthritis   . Hypertension   . Irritable bowel syndrome     Family History  Problem Relation Age of Onset  . Heart disease Mother   . Dementia Mother   . Diabetes Father   . Heart disease Sister     Past Surgical History:  Procedure Laterality Date  . ABDOMINAL HYSTERECTOMY    .  ABDOMINAL SURGERY Bilateral 15 Mar 2016  . APPENDECTOMY    . BREAST BIOPSY Left   . CATARACT EXTRACTION W/PHACO Right 11/12/2015   Procedure: CATARACT EXTRACTION PHACO AND INTRAOCULAR LENS PLACEMENT RIGHT EYE;  Surgeon: Tonny Branch, MD;  Location: [facility];  Service: Ophthalmology;  Laterality: Right;  CDE 7.40  . CATARACT EXTRACTION W/PHACO Left 12/28/2015   Procedure: CATARACT EXTRACTION PHACO AND INTRAOCULAR LENS PLACEMENT LEFT EYE CDE=4.79;  Surgeon: Tonny Branch, MD;  Location: [facility];  Service: Ophthalmology;  Laterality: Left;  . COLONOSCOPY  2008  . EYE SURGERY Bilateral march 2017   cataracts removed  . LAPAROTOMY N/A 03/15/2016   Procedure: EXPLORATORY LAPAROTOMY, CLOSURE OF PYLORIC ULCER ;  Surgeon: Johnathan Hausen, MD;  Location: [facility];  Service: General;  Laterality: N/A;  . THYROIDECTOMY     Social History   Occupational History  . Not on file  Tobacco Use  . Smoking status: Never Smoker  . Smokeless tobacco: Never Used  Substance and Sexual Activity  . Alcohol use: No  . Drug use: No  . Sexual activity: Never

## 2022-10-06 ENCOUNTER — Encounter (HOSPITAL_BASED_OUTPATIENT_CLINIC_OR_DEPARTMENT_OTHER): Payer: Medicare Other | Admitting: General Surgery

## 2022-10-17 DEATH — deceased
